# Patient Record
Sex: Male | Born: 1940 | Race: Black or African American | Hispanic: No | Smoking: Light tobacco smoker
Health system: Southern US, Community
[De-identification: ages and names within clinical notes are randomized; demographics above are authoritative.]

## PROBLEM LIST (undated history)

## (undated) DIAGNOSIS — F32A Depression, unspecified: Secondary | ICD-10-CM

## (undated) DIAGNOSIS — R1310 Dysphagia, unspecified: Secondary | ICD-10-CM

## (undated) DIAGNOSIS — K219 Gastro-esophageal reflux disease without esophagitis: Secondary | ICD-10-CM

## (undated) DIAGNOSIS — E669 Obesity, unspecified: Secondary | ICD-10-CM

## (undated) DIAGNOSIS — F329 Major depressive disorder, single episode, unspecified: Secondary | ICD-10-CM

## (undated) DIAGNOSIS — I679 Cerebrovascular disease, unspecified: Secondary | ICD-10-CM

## (undated) DIAGNOSIS — Z7901 Long term (current) use of anticoagulants: Secondary | ICD-10-CM

## (undated) DIAGNOSIS — I2699 Other pulmonary embolism without acute cor pulmonale: Secondary | ICD-10-CM

## (undated) DIAGNOSIS — R471 Dysarthria and anarthria: Secondary | ICD-10-CM

## (undated) DIAGNOSIS — J309 Allergic rhinitis, unspecified: Secondary | ICD-10-CM

## (undated) DIAGNOSIS — N451 Epididymitis: Secondary | ICD-10-CM

## (undated) DIAGNOSIS — I639 Cerebral infarction, unspecified: Secondary | ICD-10-CM

## (undated) DIAGNOSIS — T1491XA Suicide attempt, initial encounter: Secondary | ICD-10-CM

## (undated) DIAGNOSIS — I10 Essential (primary) hypertension: Secondary | ICD-10-CM

## (undated) DIAGNOSIS — F17201 Nicotine dependence, unspecified, in remission: Secondary | ICD-10-CM

## (undated) DIAGNOSIS — R7301 Impaired fasting glucose: Secondary | ICD-10-CM

## (undated) DIAGNOSIS — M1990 Unspecified osteoarthritis, unspecified site: Secondary | ICD-10-CM

## (undated) DIAGNOSIS — N400 Enlarged prostate without lower urinary tract symptoms: Secondary | ICD-10-CM

## (undated) DIAGNOSIS — Q6600 Congenital talipes equinovarus, unspecified foot: Secondary | ICD-10-CM

## (undated) DIAGNOSIS — E559 Vitamin D deficiency, unspecified: Secondary | ICD-10-CM

## (undated) DIAGNOSIS — E785 Hyperlipidemia, unspecified: Secondary | ICD-10-CM

## (undated) DIAGNOSIS — I2510 Atherosclerotic heart disease of native coronary artery without angina pectoris: Secondary | ICD-10-CM

## (undated) DIAGNOSIS — F1910 Other psychoactive substance abuse, uncomplicated: Secondary | ICD-10-CM

## (undated) DIAGNOSIS — N452 Orchitis: Secondary | ICD-10-CM

## (undated) DIAGNOSIS — I252 Old myocardial infarction: Secondary | ICD-10-CM

## (undated) DIAGNOSIS — M6281 Muscle weakness (generalized): Secondary | ICD-10-CM

## (undated) DIAGNOSIS — F419 Anxiety disorder, unspecified: Secondary | ICD-10-CM

## (undated) DIAGNOSIS — I671 Cerebral aneurysm, nonruptured: Secondary | ICD-10-CM

## (undated) DIAGNOSIS — N453 Epididymo-orchitis: Secondary | ICD-10-CM

## (undated) DIAGNOSIS — E119 Type 2 diabetes mellitus without complications: Secondary | ICD-10-CM

## (undated) HISTORY — DX: Cerebrovascular disease, unspecified: I67.9

## (undated) HISTORY — DX: Unspecified osteoarthritis, unspecified site: M19.90

## (undated) HISTORY — DX: Depression, unspecified: F32.A

## (undated) HISTORY — DX: Cerebral infarction, unspecified: I63.9

## (undated) HISTORY — DX: Major depressive disorder, single episode, unspecified: F32.9

## (undated) HISTORY — DX: Other psychoactive substance abuse, uncomplicated: F19.10

## (undated) HISTORY — DX: Obesity, unspecified: E66.9

## (undated) HISTORY — DX: Anxiety disorder, unspecified: F41.9

## (undated) HISTORY — DX: Gastro-esophageal reflux disease without esophagitis: K21.9

## (undated) HISTORY — DX: Epididymitis: N45.1

## (undated) HISTORY — DX: Nicotine dependence, unspecified, in remission: F17.201

## (undated) HISTORY — DX: Orchitis: N45.2

## (undated) HISTORY — DX: Hyperlipidemia, unspecified: E78.5

## (undated) HISTORY — DX: Impaired fasting glucose: R73.01

## (undated) HISTORY — DX: Essential (primary) hypertension: I10

## (undated) HISTORY — DX: Epididymo-orchitis: N45.3

## (undated) HISTORY — DX: Other pulmonary embolism without acute cor pulmonale: I26.99

## (undated) HISTORY — DX: Long term (current) use of anticoagulants: Z79.01

---

## 1964-08-06 HISTORY — PX: LAPAROTOMY: SHX154

## 1977-08-06 HISTORY — PX: ORIF FEMUR FRACTURE: SHX2119

## 1977-08-06 HISTORY — PX: TOTAL HIP ARTHROPLASTY: SHX124

## 1988-08-06 HISTORY — PX: CEREBRAL ANEURYSM REPAIR: SHX164

## 1998-08-06 HISTORY — PX: EYE SURGERY: SHX253

## 2001-02-16 ENCOUNTER — Encounter: Payer: Self-pay | Admitting: Emergency Medicine

## 2001-02-16 ENCOUNTER — Inpatient Hospital Stay (HOSPITAL_COMMUNITY): Admission: EM | Admit: 2001-02-16 | Discharge: 2001-02-20 | Payer: Self-pay | Admitting: Emergency Medicine

## 2001-02-17 ENCOUNTER — Encounter: Payer: Self-pay | Admitting: Internal Medicine

## 2001-02-18 ENCOUNTER — Encounter: Payer: Self-pay | Admitting: Internal Medicine

## 2001-05-27 ENCOUNTER — Encounter: Admission: RE | Admit: 2001-05-27 | Discharge: 2001-05-27 | Payer: Self-pay | Admitting: Ophthalmology

## 2001-05-27 ENCOUNTER — Encounter: Payer: Self-pay | Admitting: Ophthalmology

## 2001-05-29 ENCOUNTER — Ambulatory Visit (HOSPITAL_BASED_OUTPATIENT_CLINIC_OR_DEPARTMENT_OTHER): Admission: RE | Admit: 2001-05-29 | Discharge: 2001-05-29 | Payer: Self-pay | Admitting: *Deleted

## 2001-11-05 ENCOUNTER — Encounter: Payer: Self-pay | Admitting: Emergency Medicine

## 2001-11-05 ENCOUNTER — Encounter: Payer: Self-pay | Admitting: Family Medicine

## 2001-11-06 ENCOUNTER — Encounter: Payer: Self-pay | Admitting: Family Medicine

## 2001-11-06 ENCOUNTER — Inpatient Hospital Stay (HOSPITAL_COMMUNITY): Admission: EM | Admit: 2001-11-06 | Discharge: 2001-11-07 | Payer: Self-pay | Admitting: *Deleted

## 2001-11-10 ENCOUNTER — Ambulatory Visit (HOSPITAL_COMMUNITY): Admission: RE | Admit: 2001-11-10 | Discharge: 2001-11-10 | Payer: Self-pay | Admitting: Internal Medicine

## 2002-05-21 ENCOUNTER — Encounter: Payer: Self-pay | Admitting: Emergency Medicine

## 2002-05-21 ENCOUNTER — Observation Stay (HOSPITAL_COMMUNITY): Admission: EM | Admit: 2002-05-21 | Discharge: 2002-05-23 | Payer: Self-pay | Admitting: Emergency Medicine

## 2002-08-20 ENCOUNTER — Emergency Department (HOSPITAL_COMMUNITY): Admission: EM | Admit: 2002-08-20 | Discharge: 2002-08-21 | Payer: Self-pay | Admitting: Emergency Medicine

## 2003-06-29 ENCOUNTER — Emergency Department (HOSPITAL_COMMUNITY): Admission: EM | Admit: 2003-06-29 | Discharge: 2003-06-29 | Payer: Self-pay | Admitting: Emergency Medicine

## 2003-06-30 ENCOUNTER — Inpatient Hospital Stay (HOSPITAL_COMMUNITY): Admission: RE | Admit: 2003-06-30 | Discharge: 2003-07-01 | Payer: Self-pay | Admitting: Family Medicine

## 2003-08-07 DIAGNOSIS — N451 Epididymitis: Secondary | ICD-10-CM

## 2003-08-07 HISTORY — DX: Orchitis: N45.1

## 2003-09-18 ENCOUNTER — Emergency Department (HOSPITAL_COMMUNITY): Admission: EM | Admit: 2003-09-18 | Discharge: 2003-09-18 | Payer: Self-pay | Admitting: Emergency Medicine

## 2003-09-23 ENCOUNTER — Ambulatory Visit (HOSPITAL_COMMUNITY): Admission: RE | Admit: 2003-09-23 | Discharge: 2003-09-23 | Payer: Self-pay | Admitting: Family Medicine

## 2003-10-07 ENCOUNTER — Inpatient Hospital Stay (HOSPITAL_COMMUNITY): Admission: EM | Admit: 2003-10-07 | Discharge: 2003-10-12 | Payer: Self-pay | Admitting: Psychiatry

## 2003-10-28 ENCOUNTER — Emergency Department (HOSPITAL_COMMUNITY): Admission: EM | Admit: 2003-10-28 | Discharge: 2003-10-28 | Payer: Self-pay | Admitting: Emergency Medicine

## 2003-11-08 ENCOUNTER — Inpatient Hospital Stay (HOSPITAL_COMMUNITY): Admission: EM | Admit: 2003-11-08 | Discharge: 2003-11-10 | Payer: Self-pay | Admitting: Emergency Medicine

## 2004-05-05 ENCOUNTER — Ambulatory Visit: Payer: Self-pay | Admitting: *Deleted

## 2004-05-29 ENCOUNTER — Ambulatory Visit: Payer: Self-pay | Admitting: *Deleted

## 2004-06-07 ENCOUNTER — Ambulatory Visit: Payer: Self-pay | Admitting: Family Medicine

## 2004-06-13 ENCOUNTER — Ambulatory Visit: Payer: Self-pay | Admitting: Family Medicine

## 2004-06-23 ENCOUNTER — Ambulatory Visit: Payer: Self-pay | Admitting: Cardiology

## 2004-07-25 ENCOUNTER — Ambulatory Visit: Payer: Self-pay | Admitting: Cardiology

## 2004-08-09 ENCOUNTER — Ambulatory Visit: Payer: Self-pay | Admitting: *Deleted

## 2004-09-07 ENCOUNTER — Ambulatory Visit: Payer: Self-pay | Admitting: Internal Medicine

## 2004-10-05 ENCOUNTER — Ambulatory Visit: Payer: Self-pay | Admitting: Family Medicine

## 2004-10-09 ENCOUNTER — Ambulatory Visit: Payer: Self-pay | Admitting: *Deleted

## 2004-10-23 ENCOUNTER — Ambulatory Visit: Payer: Self-pay | Admitting: Cardiology

## 2004-11-09 ENCOUNTER — Ambulatory Visit: Payer: Self-pay | Admitting: Family Medicine

## 2004-11-21 ENCOUNTER — Ambulatory Visit: Payer: Self-pay | Admitting: *Deleted

## 2004-12-28 ENCOUNTER — Ambulatory Visit: Payer: Self-pay | Admitting: *Deleted

## 2005-01-29 ENCOUNTER — Ambulatory Visit: Payer: Self-pay | Admitting: *Deleted

## 2005-03-01 ENCOUNTER — Ambulatory Visit: Payer: Self-pay | Admitting: Cardiology

## 2005-03-07 ENCOUNTER — Ambulatory Visit: Payer: Self-pay | Admitting: Family Medicine

## 2005-03-21 ENCOUNTER — Ambulatory Visit: Payer: Self-pay | Admitting: *Deleted

## 2005-04-18 ENCOUNTER — Ambulatory Visit: Payer: Self-pay | Admitting: Family Medicine

## 2005-04-18 ENCOUNTER — Ambulatory Visit: Payer: Self-pay | Admitting: *Deleted

## 2005-05-08 ENCOUNTER — Ambulatory Visit: Payer: Self-pay | Admitting: Cardiology

## 2005-06-07 ENCOUNTER — Ambulatory Visit: Payer: Self-pay | Admitting: Family Medicine

## 2005-06-07 ENCOUNTER — Ambulatory Visit: Payer: Self-pay | Admitting: *Deleted

## 2005-07-06 ENCOUNTER — Ambulatory Visit: Payer: Self-pay | Admitting: Cardiology

## 2005-08-08 ENCOUNTER — Ambulatory Visit: Payer: Self-pay | Admitting: *Deleted

## 2005-09-04 ENCOUNTER — Ambulatory Visit: Payer: Self-pay | Admitting: Family Medicine

## 2005-09-12 ENCOUNTER — Ambulatory Visit: Payer: Self-pay | Admitting: *Deleted

## 2005-10-11 ENCOUNTER — Ambulatory Visit: Payer: Self-pay | Admitting: Family Medicine

## 2005-10-12 ENCOUNTER — Ambulatory Visit: Payer: Self-pay | Admitting: *Deleted

## 2005-11-15 ENCOUNTER — Ambulatory Visit: Payer: Self-pay | Admitting: Family Medicine

## 2005-11-15 ENCOUNTER — Ambulatory Visit: Payer: Self-pay | Admitting: *Deleted

## 2005-12-11 ENCOUNTER — Ambulatory Visit: Payer: Self-pay | Admitting: Cardiology

## 2005-12-20 ENCOUNTER — Ambulatory Visit: Payer: Self-pay | Admitting: Family Medicine

## 2006-01-15 ENCOUNTER — Ambulatory Visit: Payer: Self-pay | Admitting: *Deleted

## 2006-02-12 ENCOUNTER — Ambulatory Visit: Payer: Self-pay | Admitting: Cardiology

## 2006-02-20 ENCOUNTER — Ambulatory Visit: Payer: Self-pay | Admitting: *Deleted

## 2006-03-22 ENCOUNTER — Ambulatory Visit: Payer: Self-pay | Admitting: Family Medicine

## 2006-03-22 ENCOUNTER — Ambulatory Visit: Payer: Self-pay | Admitting: *Deleted

## 2006-03-28 ENCOUNTER — Ambulatory Visit (HOSPITAL_COMMUNITY): Admission: RE | Admit: 2006-03-28 | Discharge: 2006-03-28 | Payer: Self-pay | Admitting: Family Medicine

## 2006-05-21 ENCOUNTER — Ambulatory Visit: Payer: Self-pay | Admitting: Cardiology

## 2006-06-04 ENCOUNTER — Ambulatory Visit: Payer: Self-pay | Admitting: *Deleted

## 2006-06-24 ENCOUNTER — Ambulatory Visit: Payer: Self-pay | Admitting: Family Medicine

## 2006-07-08 ENCOUNTER — Ambulatory Visit: Payer: Self-pay | Admitting: Cardiology

## 2006-08-08 ENCOUNTER — Ambulatory Visit: Payer: Self-pay | Admitting: Cardiology

## 2006-08-29 ENCOUNTER — Ambulatory Visit: Payer: Self-pay | Admitting: Cardiology

## 2006-10-07 ENCOUNTER — Ambulatory Visit: Payer: Self-pay | Admitting: Family Medicine

## 2006-11-07 ENCOUNTER — Ambulatory Visit: Payer: Self-pay | Admitting: Orthopedic Surgery

## 2006-11-12 ENCOUNTER — Ambulatory Visit: Payer: Self-pay | Admitting: Cardiovascular Disease

## 2006-11-18 ENCOUNTER — Encounter (HOSPITAL_COMMUNITY): Admission: RE | Admit: 2006-11-18 | Discharge: 2006-12-18 | Payer: Self-pay | Admitting: Cardiovascular Disease

## 2006-11-18 ENCOUNTER — Ambulatory Visit: Payer: Self-pay | Admitting: Cardiovascular Disease

## 2007-01-20 ENCOUNTER — Ambulatory Visit: Payer: Self-pay | Admitting: Cardiovascular Disease

## 2007-01-20 ENCOUNTER — Ambulatory Visit: Payer: Self-pay | Admitting: Family Medicine

## 2007-01-29 ENCOUNTER — Ambulatory Visit: Payer: Self-pay | Admitting: Cardiovascular Disease

## 2007-02-26 ENCOUNTER — Ambulatory Visit: Payer: Self-pay | Admitting: Cardiology

## 2007-03-06 ENCOUNTER — Ambulatory Visit: Payer: Self-pay | Admitting: Cardiology

## 2007-04-04 ENCOUNTER — Ambulatory Visit: Payer: Self-pay | Admitting: Cardiology

## 2007-05-05 ENCOUNTER — Ambulatory Visit: Payer: Self-pay | Admitting: Internal Medicine

## 2007-05-09 ENCOUNTER — Encounter: Payer: Self-pay | Admitting: Family Medicine

## 2007-05-09 LAB — CONVERTED CEMR LAB
ALT: 13 units/L (ref 0–53)
AST: 15 units/L (ref 0–37)
Albumin: 4 g/dL (ref 3.5–5.2)
Alkaline Phosphatase: 69 units/L (ref 39–117)
BUN: 13 mg/dL (ref 6–23)
CO2: 22 meq/L (ref 19–32)
Chloride: 107 meq/L (ref 96–112)
Glucose, Bld: 91 mg/dL (ref 70–99)
PSA: 2.69 ng/mL (ref 0.10–4.00)
Potassium: 4.7 meq/L (ref 3.5–5.3)
Sodium: 139 meq/L (ref 135–145)
Total Protein: 7.6 g/dL (ref 6.0–8.3)

## 2007-05-14 ENCOUNTER — Ambulatory Visit: Payer: Self-pay | Admitting: Family Medicine

## 2007-06-02 ENCOUNTER — Ambulatory Visit: Payer: Self-pay | Admitting: Internal Medicine

## 2007-07-02 ENCOUNTER — Ambulatory Visit: Payer: Self-pay | Admitting: Cardiology

## 2007-07-18 ENCOUNTER — Ambulatory Visit: Payer: Self-pay | Admitting: Cardiology

## 2007-08-07 ENCOUNTER — Encounter: Payer: Self-pay | Admitting: Family Medicine

## 2007-08-14 ENCOUNTER — Ambulatory Visit: Payer: Self-pay | Admitting: Internal Medicine

## 2007-08-21 ENCOUNTER — Ambulatory Visit: Payer: Self-pay | Admitting: Cardiology

## 2007-09-05 ENCOUNTER — Ambulatory Visit: Payer: Self-pay | Admitting: Internal Medicine

## 2007-09-19 ENCOUNTER — Emergency Department (HOSPITAL_COMMUNITY): Admission: EM | Admit: 2007-09-19 | Discharge: 2007-09-19 | Payer: Self-pay | Admitting: Emergency Medicine

## 2007-09-19 ENCOUNTER — Ambulatory Visit: Payer: Self-pay | Admitting: Cardiology

## 2007-09-24 ENCOUNTER — Ambulatory Visit: Payer: Self-pay | Admitting: Family Medicine

## 2007-10-08 ENCOUNTER — Ambulatory Visit: Payer: Self-pay | Admitting: Family Medicine

## 2007-10-08 LAB — CONVERTED CEMR LAB
AST: 20 units/L (ref 0–37)
Albumin: 4.1 g/dL (ref 3.5–5.2)
Alkaline Phosphatase: 159 units/L — ABNORMAL HIGH (ref 39–117)
BUN: 22 mg/dL (ref 6–23)
Calcium: 9.4 mg/dL (ref 8.4–10.5)
Chloride: 107 meq/L (ref 96–112)
Creatinine, Ser: 1.2 mg/dL (ref 0.40–1.50)
HDL: 40 mg/dL (ref >39)
Indirect Bilirubin: 0.4 mg/dL (ref 0.0–0.9)
LDL Cholesterol: 145 mg/dL — ABNORMAL HIGH (ref 0–99)
Total Protein: 8.2 g/dL (ref 6.0–8.3)
Triglycerides: 154 mg/dL — ABNORMAL HIGH (ref <150)

## 2007-11-03 ENCOUNTER — Ambulatory Visit: Payer: Self-pay | Admitting: Cardiology

## 2007-12-01 ENCOUNTER — Ambulatory Visit: Payer: Self-pay | Admitting: Cardiology

## 2007-12-31 ENCOUNTER — Ambulatory Visit: Payer: Self-pay | Admitting: Cardiovascular Disease

## 2008-01-14 ENCOUNTER — Ambulatory Visit: Payer: Self-pay | Admitting: Cardiology

## 2008-02-10 ENCOUNTER — Ambulatory Visit: Payer: Self-pay | Admitting: Family Medicine

## 2008-02-11 ENCOUNTER — Ambulatory Visit: Payer: Self-pay | Admitting: Cardiology

## 2008-02-16 DIAGNOSIS — E669 Obesity, unspecified: Secondary | ICD-10-CM

## 2008-02-16 DIAGNOSIS — E785 Hyperlipidemia, unspecified: Secondary | ICD-10-CM

## 2008-02-16 DIAGNOSIS — I10 Essential (primary) hypertension: Secondary | ICD-10-CM | POA: Insufficient documentation

## 2008-02-21 ENCOUNTER — Encounter: Payer: Self-pay | Admitting: Family Medicine

## 2008-02-21 LAB — CONVERTED CEMR LAB
AST: 14 units/L (ref 0–37)
Alkaline Phosphatase: 81 units/L (ref 39–117)
BUN: 17 mg/dL (ref 6–23)
CO2: 20 meq/L (ref 19–32)
Calcium: 9.1 mg/dL (ref 8.4–10.5)
Chloride: 115 meq/L — ABNORMAL HIGH (ref 96–112)
Creatinine, Ser: 1.42 mg/dL (ref 0.40–1.50)
Indirect Bilirubin: 0.2 mg/dL (ref 0.0–0.9)
LDL Cholesterol: 111 mg/dL — ABNORMAL HIGH (ref 0–99)
Total Bilirubin: 0.3 mg/dL (ref 0.3–1.2)
Triglycerides: 212 mg/dL — ABNORMAL HIGH (ref <150)

## 2008-03-12 ENCOUNTER — Encounter: Payer: Self-pay | Admitting: Family Medicine

## 2008-03-17 ENCOUNTER — Ambulatory Visit: Payer: Self-pay | Admitting: Cardiology

## 2008-04-14 ENCOUNTER — Ambulatory Visit: Payer: Self-pay | Admitting: Cardiology

## 2008-04-28 ENCOUNTER — Ambulatory Visit: Payer: Self-pay | Admitting: Cardiology

## 2008-05-10 ENCOUNTER — Ambulatory Visit: Payer: Self-pay | Admitting: Cardiology

## 2008-05-12 ENCOUNTER — Telehealth (INDEPENDENT_AMBULATORY_CARE_PROVIDER_SITE_OTHER): Payer: Self-pay | Admitting: *Deleted

## 2008-05-13 ENCOUNTER — Ambulatory Visit: Payer: Self-pay | Admitting: Family Medicine

## 2008-08-10 ENCOUNTER — Encounter: Payer: Self-pay | Admitting: Family Medicine

## 2008-08-30 ENCOUNTER — Ambulatory Visit: Payer: Self-pay | Admitting: Family Medicine

## 2008-08-30 DIAGNOSIS — E739 Lactose intolerance, unspecified: Secondary | ICD-10-CM | POA: Insufficient documentation

## 2008-09-02 ENCOUNTER — Encounter: Payer: Self-pay | Admitting: Family Medicine

## 2008-10-06 ENCOUNTER — Encounter: Payer: Self-pay | Admitting: Emergency Medicine

## 2008-10-06 ENCOUNTER — Inpatient Hospital Stay (HOSPITAL_COMMUNITY): Admission: EM | Admit: 2008-10-06 | Discharge: 2008-10-14 | Payer: Self-pay | Admitting: Emergency Medicine

## 2008-11-23 ENCOUNTER — Telehealth: Payer: Self-pay | Admitting: Family Medicine

## 2008-11-24 ENCOUNTER — Ambulatory Visit: Payer: Self-pay | Admitting: Cardiology

## 2008-11-24 ENCOUNTER — Telehealth: Payer: Self-pay | Admitting: Family Medicine

## 2008-11-24 ENCOUNTER — Ambulatory Visit: Payer: Self-pay | Admitting: Family Medicine

## 2008-11-25 ENCOUNTER — Telehealth: Payer: Self-pay | Admitting: Family Medicine

## 2008-11-25 ENCOUNTER — Encounter: Payer: Self-pay | Admitting: Family Medicine

## 2008-11-26 ENCOUNTER — Telehealth: Payer: Self-pay | Admitting: Family Medicine

## 2008-11-26 LAB — CONVERTED CEMR LAB
ALT: <8 units/L (ref 0–53)
Alkaline Phosphatase: 89 units/L (ref 39–117)
BUN: 17 mg/dL (ref 6–23)
Basophils Absolute: 0 10*3/uL (ref 0.0–0.1)
Basophils Relative: 0 % (ref 0–1)
Bilirubin, Direct: 0.1 mg/dL (ref 0.0–0.3)
Calcium: 9.4 mg/dL (ref 8.4–10.5)
Cholesterol: 216 mg/dL — ABNORMAL HIGH (ref 0–200)
Creatinine, Ser: 1.21 mg/dL (ref 0.40–1.50)
Eosinophils Absolute: 0.2 10*3/uL (ref 0.0–0.7)
Eosinophils Relative: 3 % (ref 0–5)
Glucose, Bld: 90 mg/dL (ref 70–99)
HCT: 44.1 % (ref 39.0–52.0)
Hemoglobin: 15.4 g/dL (ref 13.0–17.0)
Indirect Bilirubin: 0.3 mg/dL (ref 0.0–0.9)
MCHC: 34.9 g/dL (ref 30.0–36.0)
Monocytes Absolute: 0.7 10*3/uL (ref 0.1–1.0)
Platelets: 166 10*3/uL (ref 150–400)
Potassium: 4.2 meq/L (ref 3.5–5.3)
RDW: 13.5 % (ref 11.5–15.5)
VLDL: 25 mg/dL (ref 0–40)

## 2008-11-30 ENCOUNTER — Telehealth: Payer: Self-pay | Admitting: Family Medicine

## 2008-12-01 ENCOUNTER — Telehealth: Payer: Self-pay | Admitting: Family Medicine

## 2008-12-02 ENCOUNTER — Encounter: Payer: Self-pay | Admitting: Family Medicine

## 2008-12-07 ENCOUNTER — Encounter: Payer: Self-pay | Admitting: Family Medicine

## 2008-12-10 ENCOUNTER — Encounter: Admission: RE | Admit: 2008-12-10 | Discharge: 2008-12-10 | Payer: Self-pay | Admitting: Neurosurgery

## 2008-12-13 ENCOUNTER — Encounter: Payer: Self-pay | Admitting: Family Medicine

## 2008-12-16 ENCOUNTER — Ambulatory Visit: Payer: Self-pay | Admitting: Cardiology

## 2008-12-28 ENCOUNTER — Encounter: Payer: Self-pay | Admitting: Family Medicine

## 2008-12-30 ENCOUNTER — Encounter: Payer: Self-pay | Admitting: Family Medicine

## 2009-01-06 ENCOUNTER — Ambulatory Visit: Payer: Self-pay | Admitting: Family Medicine

## 2009-01-13 ENCOUNTER — Encounter: Payer: Self-pay | Admitting: Family Medicine

## 2009-01-14 ENCOUNTER — Encounter: Payer: Self-pay | Admitting: Family Medicine

## 2009-01-14 ENCOUNTER — Encounter: Payer: Self-pay | Admitting: Orthopedic Surgery

## 2009-01-14 ENCOUNTER — Encounter: Admission: RE | Admit: 2009-01-14 | Discharge: 2009-01-14 | Payer: Self-pay | Admitting: Neurosurgery

## 2009-01-18 ENCOUNTER — Encounter: Payer: Self-pay | Admitting: Family Medicine

## 2009-01-19 ENCOUNTER — Encounter: Payer: Self-pay | Admitting: Family Medicine

## 2009-01-20 ENCOUNTER — Telehealth: Payer: Self-pay | Admitting: Family Medicine

## 2009-01-24 ENCOUNTER — Ambulatory Visit: Payer: Self-pay | Admitting: Family Medicine

## 2009-02-02 ENCOUNTER — Encounter: Payer: Self-pay | Admitting: Family Medicine

## 2009-02-02 LAB — CONVERTED CEMR LAB: TSH: 0.921 microintl units/mL (ref 0.350–4.500)

## 2009-02-09 ENCOUNTER — Encounter: Payer: Self-pay | Admitting: Family Medicine

## 2009-03-01 ENCOUNTER — Encounter: Payer: Self-pay | Admitting: Family Medicine

## 2009-03-07 ENCOUNTER — Encounter: Payer: Self-pay | Admitting: Cardiology

## 2009-03-16 ENCOUNTER — Encounter: Admission: RE | Admit: 2009-03-16 | Discharge: 2009-03-16 | Payer: Self-pay | Admitting: Neurosurgery

## 2009-03-17 ENCOUNTER — Ambulatory Visit: Payer: Self-pay

## 2009-03-17 ENCOUNTER — Encounter: Payer: Self-pay | Admitting: Family Medicine

## 2009-03-17 ENCOUNTER — Encounter: Payer: Self-pay | Admitting: Cardiology

## 2009-03-21 ENCOUNTER — Encounter: Payer: Self-pay | Admitting: *Deleted

## 2009-04-04 ENCOUNTER — Ambulatory Visit: Payer: Self-pay

## 2009-04-06 ENCOUNTER — Encounter: Payer: Self-pay | Admitting: Family Medicine

## 2009-04-07 ENCOUNTER — Ambulatory Visit: Payer: Self-pay | Admitting: Family Medicine

## 2009-04-07 LAB — CONVERTED CEMR LAB
Bilirubin Urine: NEGATIVE
Ketones, urine, test strip: NEGATIVE
Specific Gravity, Urine: 1.02
Urobilinogen, UA: 1

## 2009-04-08 ENCOUNTER — Encounter: Payer: Self-pay | Admitting: Family Medicine

## 2009-04-13 LAB — CONVERTED CEMR LAB
ALT: 11 units/L (ref 0–53)
Albumin: 3.7 g/dL (ref 3.5–5.2)
Cholesterol: 193 mg/dL (ref 0–200)
Glucose, Bld: 83 mg/dL (ref 70–99)
Potassium: 4.5 meq/L (ref 3.5–5.3)
Sodium: 142 meq/L (ref 135–145)
Total CHOL/HDL Ratio: 4
Total Protein: 7.8 g/dL (ref 6.0–8.3)
Triglycerides: 132 mg/dL (ref <150)
VLDL: 26 mg/dL (ref 0–40)

## 2009-04-27 ENCOUNTER — Encounter: Payer: Self-pay | Admitting: Orthopedic Surgery

## 2009-05-02 ENCOUNTER — Ambulatory Visit: Payer: Self-pay | Admitting: Cardiology

## 2009-05-02 LAB — CONVERTED CEMR LAB: POC INR: 2.4

## 2009-05-04 ENCOUNTER — Ambulatory Visit: Payer: Self-pay | Admitting: Orthopedic Surgery

## 2009-05-06 ENCOUNTER — Telehealth: Payer: Self-pay | Admitting: Orthopedic Surgery

## 2009-05-09 ENCOUNTER — Encounter: Payer: Self-pay | Admitting: Family Medicine

## 2009-05-10 ENCOUNTER — Ambulatory Visit (HOSPITAL_COMMUNITY): Admission: RE | Admit: 2009-05-10 | Discharge: 2009-05-10 | Payer: Self-pay | Admitting: Family Medicine

## 2009-05-12 ENCOUNTER — Telehealth: Payer: Self-pay | Admitting: Orthopedic Surgery

## 2009-05-30 ENCOUNTER — Ambulatory Visit: Payer: Self-pay | Admitting: Cardiology

## 2009-05-30 LAB — CONVERTED CEMR LAB: POC INR: 2.6

## 2009-06-07 ENCOUNTER — Encounter: Payer: Self-pay | Admitting: Family Medicine

## 2009-06-08 ENCOUNTER — Encounter: Payer: Self-pay | Admitting: Family Medicine

## 2009-06-08 ENCOUNTER — Telehealth: Payer: Self-pay | Admitting: Family Medicine

## 2009-06-13 ENCOUNTER — Encounter: Payer: Self-pay | Admitting: Family Medicine

## 2009-06-27 ENCOUNTER — Ambulatory Visit: Payer: Self-pay | Admitting: Cardiology

## 2009-07-07 ENCOUNTER — Encounter: Payer: Self-pay | Admitting: Family Medicine

## 2009-07-08 ENCOUNTER — Encounter: Payer: Self-pay | Admitting: Family Medicine

## 2009-07-27 ENCOUNTER — Ambulatory Visit: Payer: Self-pay | Admitting: Cardiology

## 2009-08-03 ENCOUNTER — Ambulatory Visit: Payer: Self-pay | Admitting: Family Medicine

## 2009-08-03 DIAGNOSIS — N453 Epididymo-orchitis: Secondary | ICD-10-CM | POA: Insufficient documentation

## 2009-08-06 DIAGNOSIS — R7301 Impaired fasting glucose: Secondary | ICD-10-CM

## 2009-08-06 HISTORY — DX: Impaired fasting glucose: R73.01

## 2009-08-08 ENCOUNTER — Encounter: Payer: Self-pay | Admitting: Family Medicine

## 2009-08-10 ENCOUNTER — Encounter: Payer: Self-pay | Admitting: Family Medicine

## 2009-08-25 ENCOUNTER — Ambulatory Visit: Payer: Self-pay | Admitting: Cardiology

## 2009-09-08 ENCOUNTER — Telehealth: Payer: Self-pay | Admitting: Family Medicine

## 2009-09-21 ENCOUNTER — Encounter: Payer: Self-pay | Admitting: Family Medicine

## 2009-09-22 ENCOUNTER — Ambulatory Visit: Payer: Self-pay | Admitting: Cardiovascular Disease

## 2009-10-04 ENCOUNTER — Ambulatory Visit: Payer: Self-pay | Admitting: Family Medicine

## 2009-10-19 ENCOUNTER — Ambulatory Visit: Payer: Self-pay | Admitting: Family Medicine

## 2009-10-20 ENCOUNTER — Ambulatory Visit: Payer: Self-pay | Admitting: Cardiology

## 2009-11-01 LAB — CONVERTED CEMR LAB
BUN: 26 mg/dL — ABNORMAL HIGH (ref 6–23)
Bilirubin, Direct: 0.1 mg/dL (ref 0.0–0.3)
Chloride: 104 meq/L (ref 96–112)
Glucose, Bld: 84 mg/dL (ref 70–99)
Hgb A1c MFr Bld: 6.4 % — ABNORMAL HIGH (ref 4.6–6.1)
Indirect Bilirubin: 0.4 mg/dL (ref 0.0–0.9)
LDL Cholesterol: 108 mg/dL — ABNORMAL HIGH (ref 0–99)
Potassium: 4.7 meq/L (ref 3.5–5.3)
Total Protein: 8 g/dL (ref 6.0–8.3)
VLDL: 32 mg/dL (ref 0–40)

## 2009-11-17 ENCOUNTER — Telehealth: Payer: Self-pay | Admitting: Family Medicine

## 2009-11-17 ENCOUNTER — Ambulatory Visit: Payer: Self-pay | Admitting: Cardiology

## 2009-11-17 LAB — CONVERTED CEMR LAB: POC INR: 2.1

## 2009-12-15 ENCOUNTER — Ambulatory Visit: Payer: Self-pay | Admitting: Cardiology

## 2009-12-15 LAB — CONVERTED CEMR LAB: POC INR: 2

## 2009-12-16 ENCOUNTER — Ambulatory Visit: Payer: Self-pay | Admitting: Cardiology

## 2009-12-16 ENCOUNTER — Inpatient Hospital Stay (HOSPITAL_COMMUNITY): Admission: EM | Admit: 2009-12-16 | Discharge: 2009-12-21 | Payer: Self-pay | Admitting: Emergency Medicine

## 2009-12-16 ENCOUNTER — Ambulatory Visit: Payer: Self-pay | Admitting: Surgery

## 2009-12-16 ENCOUNTER — Encounter (INDEPENDENT_AMBULATORY_CARE_PROVIDER_SITE_OTHER): Payer: Self-pay | Admitting: Internal Medicine

## 2009-12-17 ENCOUNTER — Encounter (INDEPENDENT_AMBULATORY_CARE_PROVIDER_SITE_OTHER): Payer: Self-pay | Admitting: Internal Medicine

## 2009-12-19 ENCOUNTER — Ambulatory Visit: Payer: Self-pay | Admitting: Physical Medicine & Rehabilitation

## 2009-12-29 ENCOUNTER — Ambulatory Visit: Payer: Self-pay | Admitting: Cardiology

## 2009-12-29 ENCOUNTER — Encounter (HOSPITAL_COMMUNITY): Admission: RE | Admit: 2009-12-29 | Discharge: 2010-01-28 | Payer: Self-pay | Admitting: Cardiology

## 2010-01-10 ENCOUNTER — Ambulatory Visit: Payer: Self-pay | Admitting: Cardiology

## 2010-02-02 ENCOUNTER — Encounter (INDEPENDENT_AMBULATORY_CARE_PROVIDER_SITE_OTHER): Payer: Self-pay | Admitting: Pharmacist

## 2010-02-23 ENCOUNTER — Ambulatory Visit (HOSPITAL_COMMUNITY): Admission: RE | Admit: 2010-02-23 | Discharge: 2010-02-23 | Payer: Self-pay | Admitting: Internal Medicine

## 2010-03-08 ENCOUNTER — Encounter (INDEPENDENT_AMBULATORY_CARE_PROVIDER_SITE_OTHER): Payer: Self-pay | Admitting: *Deleted

## 2010-03-08 ENCOUNTER — Encounter: Payer: Self-pay | Admitting: Cardiology

## 2010-03-08 LAB — CONVERTED CEMR LAB
ALT: 15 units/L
AST: 15 units/L
Alkaline Phosphatase: 75 units/L
Bilirubin, Direct: 0.1 mg/dL
CO2: 26 meq/L
Hemoglobin: 13.8 g/dL
Hgb A1c MFr Bld: 5.7 %
Prothrombin Time: 25.9 s
RDW: 13.6 %
Sodium: 142 meq/L
Total Protein: 7.3 g/dL

## 2010-03-10 ENCOUNTER — Encounter (INDEPENDENT_AMBULATORY_CARE_PROVIDER_SITE_OTHER): Payer: Self-pay | Admitting: *Deleted

## 2010-03-17 ENCOUNTER — Ambulatory Visit: Payer: Self-pay | Admitting: Cardiology

## 2010-03-17 DIAGNOSIS — F17219 Nicotine dependence, cigarettes, with unspecified nicotine-induced disorders: Secondary | ICD-10-CM | POA: Insufficient documentation

## 2010-04-11 ENCOUNTER — Ambulatory Visit (HOSPITAL_COMMUNITY): Admission: RE | Admit: 2010-04-11 | Discharge: 2010-04-11 | Payer: Self-pay | Admitting: Internal Medicine

## 2010-04-12 ENCOUNTER — Telehealth: Payer: Self-pay | Admitting: Family Medicine

## 2010-04-13 ENCOUNTER — Telehealth: Payer: Self-pay | Admitting: Family Medicine

## 2010-04-18 ENCOUNTER — Ambulatory Visit: Payer: Self-pay | Admitting: Family Medicine

## 2010-04-20 ENCOUNTER — Encounter: Payer: Self-pay | Admitting: Family Medicine

## 2010-04-21 ENCOUNTER — Encounter: Payer: Self-pay | Admitting: Family Medicine

## 2010-04-24 ENCOUNTER — Encounter: Payer: Self-pay | Admitting: Family Medicine

## 2010-04-25 ENCOUNTER — Encounter: Payer: Self-pay | Admitting: Family Medicine

## 2010-05-11 ENCOUNTER — Telehealth: Payer: Self-pay | Admitting: Family Medicine

## 2010-05-12 ENCOUNTER — Telehealth: Payer: Self-pay | Admitting: Family Medicine

## 2010-05-17 ENCOUNTER — Telehealth: Payer: Self-pay | Admitting: Family Medicine

## 2010-05-31 ENCOUNTER — Telehealth: Payer: Self-pay | Admitting: Family Medicine

## 2010-06-12 LAB — CONVERTED CEMR LAB: Hgb A1c MFr Bld: 5.8 % — ABNORMAL HIGH (ref <5.7)

## 2010-06-20 ENCOUNTER — Telehealth: Payer: Self-pay | Admitting: Family Medicine

## 2010-06-20 ENCOUNTER — Ambulatory Visit: Payer: Self-pay | Admitting: Family Medicine

## 2010-06-26 ENCOUNTER — Encounter: Payer: Self-pay | Admitting: Family Medicine

## 2010-06-26 ENCOUNTER — Telehealth: Payer: Self-pay | Admitting: Family Medicine

## 2010-06-26 ENCOUNTER — Ambulatory Visit
Admission: RE | Admit: 2010-06-26 | Discharge: 2010-06-26 | Payer: Self-pay | Source: Home / Self Care | Admitting: Family Medicine

## 2010-07-01 ENCOUNTER — Encounter: Payer: Self-pay | Admitting: Family Medicine

## 2010-07-01 LAB — CONVERTED CEMR LAB
Bilirubin Urine: NEGATIVE
Casts: NONE SEEN /lpf
Crystals: NONE SEEN
Hemoglobin, Urine: NEGATIVE
Ketones, ur: NEGATIVE mg/dL
Urine Glucose: NEGATIVE mg/dL
pH: 6.5 (ref 5.0–8.0)

## 2010-07-14 ENCOUNTER — Encounter: Payer: Self-pay | Admitting: Family Medicine

## 2010-07-28 ENCOUNTER — Encounter: Payer: Self-pay | Admitting: Family Medicine

## 2010-08-06 HISTORY — PX: COLONOSCOPY: SHX174

## 2010-08-11 ENCOUNTER — Telehealth: Payer: Self-pay | Admitting: Family Medicine

## 2010-08-14 ENCOUNTER — Ambulatory Visit
Admission: RE | Admit: 2010-08-14 | Discharge: 2010-08-14 | Payer: Self-pay | Source: Home / Self Care | Attending: Family Medicine | Admitting: Family Medicine

## 2010-08-16 ENCOUNTER — Telehealth: Payer: Self-pay | Admitting: Family Medicine

## 2010-08-16 DIAGNOSIS — N390 Urinary tract infection, site not specified: Secondary | ICD-10-CM | POA: Insufficient documentation

## 2010-08-16 LAB — CONVERTED CEMR LAB
Basophils Absolute: 0 10*3/uL (ref 0.0–0.1)
Lymphocytes Relative: 45 % (ref 12–46)
Lymphs Abs: 2.7 10*3/uL (ref 0.7–4.0)
Neutrophils Relative %: 44 % (ref 43–77)
Platelets: 110 10*3/uL — ABNORMAL LOW (ref 150–400)
RDW: 12.8 % (ref 11.5–15.5)
WBC: 6.1 10*3/uL (ref 4.0–10.5)

## 2010-08-17 ENCOUNTER — Encounter: Payer: Self-pay | Admitting: Family Medicine

## 2010-08-17 ENCOUNTER — Telehealth: Payer: Self-pay | Admitting: Family Medicine

## 2010-08-18 ENCOUNTER — Ambulatory Visit
Admission: RE | Admit: 2010-08-18 | Discharge: 2010-08-18 | Payer: Self-pay | Source: Home / Self Care | Attending: Cardiology | Admitting: Cardiology

## 2010-08-18 LAB — CONVERTED CEMR LAB
Hgb A1c MFr Bld: 6 % — ABNORMAL HIGH (ref <5.7)
POC INR: 3.5

## 2010-08-20 ENCOUNTER — Encounter: Payer: Self-pay | Admitting: Family Medicine

## 2010-08-21 ENCOUNTER — Encounter: Payer: Self-pay | Admitting: Family Medicine

## 2010-08-21 LAB — CONVERTED CEMR LAB
Blood, UA: NEGATIVE
Casts: NONE SEEN /lpf
Protein, ur: 30 mg/dL — AB
Urine Glucose: NEGATIVE mg/dL
Urobilinogen, UA: 1 (ref 0.0–1.0)

## 2010-08-22 ENCOUNTER — Telehealth: Payer: Self-pay | Admitting: Family Medicine

## 2010-08-22 ENCOUNTER — Encounter: Payer: Self-pay | Admitting: Family Medicine

## 2010-08-24 ENCOUNTER — Ambulatory Visit: Admission: RE | Admit: 2010-08-24 | Discharge: 2010-08-24 | Payer: Self-pay | Source: Home / Self Care

## 2010-08-28 ENCOUNTER — Encounter: Payer: Self-pay | Admitting: Family Medicine

## 2010-08-28 ENCOUNTER — Telehealth (INDEPENDENT_AMBULATORY_CARE_PROVIDER_SITE_OTHER): Payer: Self-pay | Admitting: *Deleted

## 2010-08-28 ENCOUNTER — Telehealth: Payer: Self-pay | Admitting: Family Medicine

## 2010-08-28 ENCOUNTER — Encounter: Payer: Self-pay | Admitting: Neurosurgery

## 2010-09-05 ENCOUNTER — Encounter: Payer: Self-pay | Admitting: Family Medicine

## 2010-09-05 NOTE — Letter (Signed)
Summary: phone notes  phone notes   Imported By: Curtis Sites 01/04/2010 12:05:00  _____________________________________________________________________  External Attachment:    Type:   Image     Comment:   External Document

## 2010-09-05 NOTE — Miscellaneous (Signed)
Summary: Home Care Report  Home Care Report   Imported By: Lind Guest 10/20/2009 08:28:20  _____________________________________________________________________  External Attachment:    Type:   Image     Comment:   External Document

## 2010-09-05 NOTE — Progress Notes (Signed)
  Phone Note From Pharmacy   Caller: [facility] Summary of Call: patient no longer wants to drink the nutritional suppliment can we d/c Initial call taken by: Adella Hare LPN,  April 13, 2010 5:02 PM  Follow-up for Phone Call        yes per dr simspon pharmacy notified Follow-up by: Adella Hare LPN,  April 13, 2010 5:03 PM

## 2010-09-05 NOTE — Miscellaneous (Signed)
Summary: LABS CBCD,PT,BMP,LIVER,A1C,03/08/2010  Clinical Lists Changes  Observations: Added new observation of CALCIUM: 9.4 mg/dL (16/05/9603 54:09) Added new observation of ALBUMIN: 3.5 g/dL (81/19/1478 29:56) Added new observation of PROTEIN, TOT: 7.3 g/dL (21/30/8657 84:69) Added new observation of SGPT (ALT): 15 units/L (03/08/2010 15:17) Added new observation of SGOT (AST): 15 units/L (03/08/2010 15:17) Added new observation of ALK PHOS: 75 units/L (03/08/2010 15:17) Added new observation of BILI DIRECT: 0.1 mg/dL (62/95/2841 32:44) Added new observation of CREATININE: 1.20 mg/dL (08/08/7251 66:44) Added new observation of BUN: 24 mg/dL (03/47/4259 56:38) Added new observation of BG RANDOM: 86 mg/dL (75/64/3329 51:88) Added new observation of CO2 PLSM/SER: 26 meq/L (03/08/2010 15:17) Added new observation of CL SERUM: 113 meq/L (03/08/2010 15:17) Added new observation of K SERUM: 3.9 meq/L (03/08/2010 15:17) Added new observation of NA: 142 meq/L (03/08/2010 15:17) Added new observation of HGBA1C: 5.7 % (03/08/2010 15:17) Added new observation of PT PATIENT: 25.9 s (03/08/2010 15:17) Added new observation of PLATELETK/UL: 100 K/uL (03/08/2010 15:17) Added new observation of RDW: 13.6 % (03/08/2010 15:17) Added new observation of MCHC RBC: 34.8 g/dL (41/66/0630 16:01) Added new observation of MCV: 98.3 fL (03/08/2010 15:17) Added new observation of HCT: 39.7 % (03/08/2010 15:17) Added new observation of HGB: 13.8 g/dL (09/32/3557 32:20) Added new observation of RBC M/UL: 4.04 M/uL (03/08/2010 15:17) Added new observation of WBC COUNT: 6.1 10*3/microliter (03/08/2010 15:17)

## 2010-09-05 NOTE — Letter (Signed)
Summary: history and physical  history and physical   Imported By: Curtis Sites 01/04/2010 12:00:17  _____________________________________________________________________  External Attachment:    Type:   Image     Comment:   External Document

## 2010-09-05 NOTE — Medication Information (Signed)
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 no    Recent/future dental: no  Any missed doses?: no       Is patient compliant with meds? yes       Allergies: No Known Drug Allergies  Anticoagulation Management History:      The patient is taking warfarin and comes in today for a routine follow up visit.  Positive risk factors for bleeding include an age of 84 years or older.  The bleeding index is 'intermediate risk'.  Positive CHADS2 values include History of HTN.  Negative CHADS2 values include Age > 4 years old.  The start date was 06/30/2003.  Anticoagulation responsible provider: Dietrich Pates MD, Molly Maduro.  INR POC: 2.1.  Cuvette Lot#: [id number].  Exp: 10/11.    Anticoagulation Management Assessment/Plan:      The patient's current anticoagulation dose is Coumadin 5 mg tabs: Use as directed.  The target INR is 2 - 3.  The next INR is due 11/17/2009.  Anticoagulation instructions were given to patient.  Results were reviewed/authorized by Vashti Hey, RN.  He was notified by Vashti Hey RN.         Prior Anticoagulation Instructions: INR 2.4 Continue coumadin 5mg  once daily except 2.5mg  on Mondays, Wednesdays and Fridays  Current Anticoagulation Instructions: INR 2.1 Continue coumadin 5mg  once daily except 2.5mg  on Mondays, Wednesdays and Fridays

## 2010-09-05 NOTE — Medication Information (Signed)
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  They are managing his coumadin.  Allergies: No Known Drug Allergies  Anticoagulation Management History:      Positive risk factors for bleeding include an age of 69 years or older.  The bleeding index is 'intermediate risk'.  Positive CHADS2 values include History of HTN.  Negative CHADS2 values include Age > 58 years old.  The start date was 06/30/2003.  Anticoagulation responsible provider: Dietrich Pates MD, Molly Maduro.  Exp: 10/11.    Anticoagulation Management Assessment/Plan:      The patient's current anticoagulation dose is Coumadin 5 mg tabs: Use as directed.  The target INR is 2 - 3.  The next INR is due 01/12/2010.  Anticoagulation instructions were given to patient.  Results were reviewed/authorized by Inactive.         Prior Anticoagulation Instructions: INR 2.0 Continue coumadin 5mg  once daily except 2.5mg  on Mondays, Wednesdays and Fridays

## 2010-09-05 NOTE — Progress Notes (Signed)
Summary: cold (2 calls)  Phone Note Call from Patient   Summary of Call: home away rom left message he has a cold feverish  cong could you call something or fit him in today call back at [phone] Initial call taken by: Lind Guest,  May 31, 2010 12:44 PM  Follow-up for Phone Call        Joann from home away from home, wanted to know if we could call in [name] something for his congestion.  She states he has not had a BM in 4 days.  Please advise.  Additional Follow-up for Phone Call Additional follow up Details #1::        dvise tessalon perles called in make appt for early next week if no better, or fever , chills, green spurtum. there are standing order I am sure for constipation they need to follow those, andgive him prune juice , warm and prunes Additional Follow-up by: Syliva Overman MD,  June 01, 2010 1:05 PM    Additional Follow-up for Phone Call Additional follow up Details #2::    RETURNED CALL, BUSY SIGNAL X 3 Follow-up by: Adella Hare LPN,  June 02, 2010 3:13 PM  Additional Follow-up for Phone Call Additional follow up Details #3:: Details for Additional Follow-up Action Taken: patient doing much better per facility Additional Follow-up by: Adella Hare LPN,  June 06, 2010 9:21 AM  New/Updated Medications: TESSALON PERLES 100 MG CAPS (BENZONATATE) Take 1 capsule by mouth three times a day Prescriptions: TESSALON PERLES 100 MG CAPS (BENZONATATE) Take 1 capsule by mouth three times a day  #30 x 0   Entered and Authorized by:   Syliva Overman MD   Signed by:   Syliva Overman MD on 06/01/2010   Method used:   Electronically to        [facility] (retail)       [address]       [place], [place]  [address]       Ph: [phone]       Fax: [phone]   RxID:   [id number]

## 2010-09-05 NOTE — Letter (Signed)
Summary: labs  labs   Imported By: Curtis Sites 01/04/2010 12:15:47  _____________________________________________________________________  External Attachment:    Type:   Image     Comment:   External Document

## 2010-09-05 NOTE — Miscellaneous (Signed)
Summary: Orders Update  Clinical Lists Changes  Orders: Added new Test order of T-Urinalysis with Culture Reflex (65001) - Signed 

## 2010-09-05 NOTE — Miscellaneous (Signed)
Summary: refill  Clinical Lists Changes  Medications: Rx of OXYBUTYNIN CHLORIDE 10 MG XR24H-TAB (OXYBUTYNIN CHLORIDE) Take 1 tablet by mouth once a day;  #30 x 3;  Signed;  Entered by: Everitt Amber;  Authorized by: Syliva Overman MD;  Method used: Printed then faxed to [facility] [address], [place], [place], [place]  [address], Ph: [phone], Fax: [phone] Rx of ALPRAZOLAM 0.25 MG TABS (ALPRAZOLAM) Take 1 tablet by mouth three times a day;  #90 x 3;  Signed;  Entered by: Everitt Amber;  Authorized by: Syliva Overman MD;  Method used: Printed then faxed to [facility] [address], [place], [place], [place]  [address], Ph: [phone], Fax: [phone]    Prescriptions: ALPRAZOLAM 0.25 MG TABS (ALPRAZOLAM) Take 1 tablet by mouth three times a day  #90 x 3   Entered by:   Everitt Amber   Authorized by:   Syliva Overman MD   Signed by:   Everitt Amber on 08/10/2009   Method used:   Printed then faxed to ...       [facility] (retail)       [address]       [place], [place]  [address]       Ph: [phone]       Fax: [phone]   RxID:   [id number] OXYBUTYNIN CHLORIDE 10 MG XR24H-TAB (OXYBUTYNIN CHLORIDE) Take 1 tablet by mouth once a day  #30 x 3   Entered by:   Everitt Amber   Authorized by:   Syliva Overman MD   Signed by:   Everitt Amber on 08/10/2009   Method used:   Printed then faxed to ...       [facility] (retail)       [address]       [place], [place]  [address]       Ph: [phone]       Fax: [phone]   RxID:   [id number]

## 2010-09-05 NOTE — Letter (Signed)
Summary: med review  med review   Imported By: Lind Guest 06/20/2010 08:14:52  _____________________________________________________________________  External Attachment:    Type:   Image     Comment:   External Document

## 2010-09-05 NOTE — Letter (Signed)
Summary: wheelchair  wheelchair   Imported By: Lind Guest 07/14/2010 16:40:59  _____________________________________________________________________  External Attachment:    Type:   Image     Comment:   External Document

## 2010-09-05 NOTE — Assessment & Plan Note (Signed)
Summary: F UP   Vital Signs:  Patient profile:   70 year old male Height:      67 inches O2 Sat:      98 % on Room air Pulse rate:   59 / minute Pulse rhythm:   regular Resp:     16 per minute BP sitting:   98 / 62  (right arm)  Vitals Entered By: Mauricia Area CMA (June 20, 2010 11:55 AM)  O2 Flow:  Room air CC: Leg pain   Primary Care Provider:  Dr. Syliva Overman  CC:  Leg pain.  History of Present Illness: Pt in for routine f/u as well as to get a pWC. He has had 2 strokes and has upper and lower ext weakness, currently in a manual w/chair his arms are weak, needs a walker to ambulate has fallen as recently as 4 days ago C/O leg pain, bilateral , also weakness in the right leg espescially, he is having increased difficulty ambulating safely, even in the home, and is requesting a power wheelchair. he suffers bilateral upper extremity weakness,he is a stroke victim, and is therfore unable to propel himself in a manual wheelchairfor any appreciable time. Reports  thathe is otherwise doing fairly well. Denies recent fever or chills. Denies sinus pressure, nasal congestion , ear pain or sore throat. Denies chest congestion, or cough productive of sputum. Denies chest pain, palpitations, PND, orthopnea or leg swelling. Denies abdominal pain, nausea, vomitting, diarrhea or constipation. Denies change in bowel movements or bloody stool. Denies dysuria , frequency, incontinence or hesitancy.  Denies headaches, vertigo, seizures. Denies depression, anxiety or insomnia. Denies  rash, lesions, or itch.     Current Medications (verified): 1)  Carvedilol 25 Mg Tabs (Carvedilol) .... Take 1 Tab Two Times A Day 2)  Coumadin 5 Mg Tabs (Warfarin Sodium) .... Use As Directed 3)  Colace 100 Mg Caps (Docusate Sodium) .... Take 1 Capsule By Mouth Two Times A Day 4)  Lisinopril 40 Mg Tabs (Lisinopril) .... One Tab By Mouth Qd 5)  Citalopram Hydrobromide 40 Mg Tabs (Citalopram  Hydrobromide) .... Take 1 Tablet By Mouth Once A Day 6)  Alprazolam 0.25 Mg Tabs (Alprazolam) .... Take 1 Tablet By Mouth Three Times A Day 7)  Oxybutynin Chloride 10 Mg Xr24h-Tab (Oxybutynin Chloride) .... Take 1 Tablet By Mouth Once A Day 8)  Tylenol 325 Mg Tabs (Acetaminophen) .... Take 1 Tablet By Mouth Three Times A Day 9)  Antivert 25 Mg Tabs (Meclizine Hcl) .Marland Kitchen.. 1 Q 8 Hrs As Needed Dizziness 10)  Imdur 30 Mg Xr24h-Tab (Isosorbide Mononitrate) .... Take 1 Tablet By Mouth Once Daily 11)  Protonix 40 Mg Tbec (Pantoprazole Sodium) .... Take 1 Tab Daily 12)  Furosemide 40 Mg Tabs (Furosemide) .... Take 1 Tab Daily 13)  Lovastatin 10 Mg Tabs (Lovastatin) .... Take 1 Tab Daily 14)  Tamsulosin Hcl 0.4 Mg Caps (Tamsulosin Hcl) .... Take 1 Tab Daily 15)  Resource 2.0  Liqd (Nutritional Supplements) .... Drink 1 Can Three Times A Day 16)  Coumadin 4 Mg Tabs (Warfarin Sodium) .... Use As Directed 17)  Novolog 100 Unit/ml Soln (Insulin Aspart) .... Per Sliding Scale 18)  Tramadol Hcl 50 Mg Tabs (Tramadol Hcl) .... Take 1 Tab By Mouth At Bedtime  As Needed Fo Uncontrolled Pain 19)  Tessalon Perles 100 Mg Caps (Benzonatate) .... Take 1 Capsule By Mouth Three Times A Day 20)  Tussin Expectorant 100 Mg/55ml Syrp (Guaifenesin) .... Take 2 Tablespoons Every 6 Hours  21)  Milk of Magnesia .... Take 45 Ml As Needed 22)  Anti Diarrheal 23)  Antacid Original Liquid .... 2 Tablespoons As Needed 24)  Dimenhydrinate 50 Mg Tabs (Dimenhydrinate) .Marland Kitchen.. 1 Tab Every 4 Hours As Needed 25)  Triple Antibiotic Ointment 26)  Thick It 30 Oz .Marland Kitchen.. 1 To 2 Tablespoons For Each 120 Ml of Liquid 27)  Meclizine Hcl 25 Mg Tabs (Meclizine Hcl) .Marland Kitchen.. 1 Tab Every 8 Hours  Allergies (verified): No Known Drug Allergies  Review of Systems      See HPI General:  Complains of fatigue. Eyes:  Denies discharge, eye pain, and red eye. MS:  Complains of joint pain, low back pain, mid back pain, and stiffness; c/o increased and  uncontrolled leg and back pain. Psych:  Complains of anxiety; denies depression and mental problems. Endo:  Denies cold intolerance, excessive hunger, excessive thirst, and excessive urination. Heme:  Denies abnormal bruising and bleeding. Allergy:  Complains of seasonal allergies.  Physical Exam  General:  Well-developed,obese,in no acute distress; alert,appropriate and cooperative throughout examination.Pt is wheelchair bound HEENT: No facial asymmetry,  EOMI, No sinus tenderness, TM's Clear, oropharynx  pink and moist.Poor dentition  Chest: Clear to auscultation bilaterally.  CVS: S1, S2, No murmurs, No S3.   Abd: Soft, Nontender.Obese MS: decreased  ROM spine, hips, shoulders and knees.  Ext: No edema.   CNS: CN 2-12 intact,reduced power and tone in all 4 extremities, sensation is intact  Skin: Intact, no visible lesions or rashes.  Psych: Good eye contact, normal affect.  Memory loss not anxious or depressed appearing.    Impression & Recommendations:  Problem # 1:  OSTEOARTHRITIS, BACK (ICD-715.98) Assessment Deteriorated  The following medications were removed from the medication list:    Tramadol Hcl 50 Mg Tabs (Tramadol hcl) .Marland Kitchen... Take 1 tab by mouth at bedtime  as needed fo uncontrolled pain His updated medication list for this problem includes:    Tylenol 325 Mg Tabs (Acetaminophen) .Marland Kitchen... Take 1 tablet by mouth three times a day    Tramadol Hcl 50 Mg Tabs (Tramadol hcl) .Marland Kitchen... Take 1 tablet by mouth two times a day as needed for pain  Problem # 2:  CEREBROVASCULAR DISEASE (ICD-437.9) Assessment: Comment Only  Orders: Physical Therapy Referral (PT), worsening upper and lower ext weakness, necessitating power wheelchair  Problem # 3:  HYPERLIPIDEMIA (ICD-272.4) Assessment: Comment Only  His updated medication list for this problem includes:    Lovastatin 10 Mg Tabs (Lovastatin) .Marland Kitchen... Take 1 tab daily Low fat dietdiscussed and encouraged  Labs Reviewed: SGOT: 15  (03/08/2010)   SGPT: 15 (03/08/2010)   HDL:39 (10/21/2009), 48 (04/13/2009)  LDL:108 (10/21/2009), 119 (04/13/2009)  Chol:179 (10/21/2009), 193 (04/13/2009)  Trig:161 (10/21/2009), 132 (04/13/2009)  Problem # 4:  HYPERTENSION (ICD-401.9) Assessment: Unchanged  His updated medication list for this problem includes:    Carvedilol 25 Mg Tabs (Carvedilol) .Marland Kitchen... Take 1 tab two times a day    Lisinopril 40 Mg Tabs (Lisinopril) ..... One tab by mouth qd    Furosemide 40 Mg Tabs (Furosemide) .Marland Kitchen... Take 1 tab daily  BP today: 98/62 Prior BP: 100/70 (04/18/2010)  Labs Reviewed: K+: 3.9 (03/08/2010) Creat: : 1.20 (03/08/2010)   Chol: 179 (10/21/2009)   HDL: 39 (10/21/2009)   LDL: 108 (10/21/2009)   TG: 161 (10/21/2009)  Complete Medication List: 1)  Carvedilol 25 Mg Tabs (Carvedilol) .... Take 1 tab two times a day 2)  Coumadin 5 Mg Tabs (Warfarin sodium) .... Use as directed 3)  Colace 100 Mg Caps (Docusate sodium) .... Take 1 capsule by mouth two times a day 4)  Lisinopril 40 Mg Tabs (Lisinopril) .... One tab by mouth qd 5)  Citalopram Hydrobromide 40 Mg Tabs (Citalopram hydrobromide) .... Take 1 tablet by mouth once a day 6)  Alprazolam 0.25 Mg Tabs (Alprazolam) .... Take 1 tablet by mouth three times a day 7)  Oxybutynin Chloride 10 Mg Xr24h-tab (Oxybutynin chloride) .... Take 1 tablet by mouth once a day 8)  Tylenol 325 Mg Tabs (Acetaminophen) .... Take 1 tablet by mouth three times a day 9)  Antivert 25 Mg Tabs (Meclizine hcl) .Marland Kitchen.. 1 q 8 hrs as needed dizziness 10)  Imdur 30 Mg Xr24h-tab (Isosorbide mononitrate) .... Take 1 tablet by mouth once daily 11)  Protonix 40 Mg Tbec (Pantoprazole sodium) .... Take 1 tab daily 12)  Furosemide 40 Mg Tabs (Furosemide) .... Take 1 tab daily 13)  Lovastatin 10 Mg Tabs (Lovastatin) .... Take 1 tab daily 14)  Tamsulosin Hcl 0.4 Mg Caps (Tamsulosin hcl) .... Take 1 tab daily 15)  Resource 2.0 Liqd (Nutritional supplements) .... Drink 1 can three times a  day 16)  Coumadin 4 Mg Tabs (Warfarin sodium) .... Use as directed 17)  Novolog 100 Unit/ml Soln (Insulin aspart) .... Per sliding scale 18)  Tessalon Perles 100 Mg Caps (Benzonatate) .... Take 1 capsule by mouth three times a day 19)  Tussin Expectorant 100 Mg/79ml Syrp (Guaifenesin) .... Take 2 tablespoons every 6 hours 20)  Milk of Magnesia  .... Take 45 ml as needed 21)  Anti Diarrheal  22)  Antacid Original Liquid  .... 2 tablespoons as needed 23)  Dimenhydrinate 50 Mg Tabs (Dimenhydrinate) .Marland Kitchen.. 1 tab every 4 hours as needed 24)  Triple Antibiotic Ointment  25)  Thick It 30 Oz  .Marland KitchenMarland Kitchen. 1 to 2 tablespoons for each 120 ml of liquid 26)  Meclizine Hcl 25 Mg Tabs (Meclizine hcl) .Marland Kitchen.. 1 tab every 8 hours 27)  Tramadol Hcl 50 Mg Tabs (Tramadol hcl) .... Take 1 tablet by mouth two times a day as needed for pain 28)  Ciprofloxacin Hcl 500 Mg Tabs (Ciprofloxacin hcl) .... Take 1 tablet by mouth two times a day  Other Orders: Medicare Electronic Prescription [phone])  Patient Instructions: 1)  Please schedule a follow-up appointment in 4 months. 2)  You will be referred for eval for a PWC and we will get it from apothecary. 3)  You will be  referred  to physical therapy for eval Prescriptions: CIPROFLOXACIN HCL 500 MG TABS (CIPROFLOXACIN HCL) Take 1 tablet by mouth two times a day  #20 x 0   Entered and Authorized by:   Syliva Overman MD   Signed by:   Syliva Overman MD on 06/28/2010   Method used:   Electronically to        [facility] (retail)       [address]       [place], [place]  [address]       Ph: [phone]       Fax: [phone]   RxID:   [id number] TRAMADOL HCL 50 MG TABS (TRAMADOL HCL) Take 1 tablet by mouth two times a day as needed for pain  #60 x 4   Entered and Authorized by:   Syliva Overman MD   Signed by:   Syliva Overman MD on 06/20/2010   Method used:   Electronically to  [facility] (retail)       [address]       [place], [place]  [address]       Ph: [phone]       Fax: [phone]   RxID:   [id number]    Orders Added: 1)  Est. Patient Level IV [53664] 2)  Medicare Electronic Prescription [G8553] 3)  Physical Therapy Referral [PT]     Orders Added: 1)  Est. Patient Level IV [40347] 2)  Medicare Electronic Prescription [G8553] 3)  Physical Therapy Referral [PT]

## 2010-09-05 NOTE — Miscellaneous (Signed)
Summary: Nursing Home order  Nursing Home order   Imported By: Cammie Sickle 11/09/2009 18:05:43  _____________________________________________________________________  External Attachment:    Type:   Image     Comment:   External Document

## 2010-09-05 NOTE — Miscellaneous (Signed)
  Clinical Lists Changes  Medications: Removed medication of CIPROFLOXACIN HCL 500 MG TABS (CIPROFLOXACIN HCL) Take 1 tablet by mouth two times a day Added new medication of NITROFURANTOIN MACROCRYSTAL 100 MG CAPS (NITROFURANTOIN MACROCRYSTAL) Take 1 capsule by mouth two times a day - Signed Rx of NITROFURANTOIN MACROCRYSTAL 100 MG CAPS (NITROFURANTOIN MACROCRYSTAL) Take 1 capsule by mouth two times a day;  #20 x 0;  Signed;  Entered by: Syliva Overman MD;  Authorized by: Syliva Overman MD;  Method used: Electronically to [facility] [address], [place], [place], [place]  [address], Ph: [phone], Fax: [phone]    Prescriptions: NITROFURANTOIN MACROCRYSTAL 100 MG CAPS (NITROFURANTOIN MACROCRYSTAL) Take 1 capsule by mouth two times a day  #20 x 0   Entered and Authorized by:   Syliva Overman MD   Signed by:   Syliva Overman MD on 07/01/2010   Method used:   Electronically to        [facility] (retail)       [address]       [place], [place]  [address]       Ph: [phone]       Fax: [phone]   RxID:   [id number]

## 2010-09-05 NOTE — Progress Notes (Signed)
Summary: URINE IS DARK   Phone Note Call from Patient   Summary of Call: LEFT MESSAGE HOME AWAY FROM HOME SAID HIS URINE  IS DARK AND HAS A NASTY ODER TO IT  CALL BACK AT [phone] Initial call taken by: Lind Guest,  June 26, 2010 10:54 AM  Follow-up for Phone Call        advise submit cCUA today, nurse visit to dip urine if abn will send for c/s and go ahead and prescribe antibiotics today also. Needs to be seen today by nurse Follow-up by: Syliva Overman MD,  June 26, 2010 12:13 PM  Additional Follow-up for Phone Call Additional follow up Details #1::        PATIENT IS AWARE  WILL COME IN Additional Follow-up by: Lind Guest,  June 26, 2010 12:52 PM     Appended Document: URINE IS DARK  patient came in for nurse visit ua, unable to provide specimen, sent order and collection cup with patient and they will deliver to lab once available
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
## 2010-09-05 NOTE — Progress Notes (Signed)
Summary: pain  Phone Note Call from Patient   Summary of Call: pt needs something for pain. [phone] Initial call taken by: Rudene Anda,  May 11, 2010 11:31 AM  Follow-up for Phone Call        called no answer. Patient already on tylenol 325mg  three times a day as needed  Follow-up by: Everitt Amber LPN,  May 11, 2010 11:44 AM  Additional Follow-up for Phone Call Additional follow up Details #1::        Joann at [facility] said he does take tylenol three times a day 325mg  but it's not enough. He is going through all  his therapy and its alot on him and he is doing so well but needs something for pain. He used to take hydrocodone before the stroke but he needs something to help him now fax to [facility]  Att [facility]  Additional Follow-up by: Everitt Amber LPN,  May 12, 2010 10:09 AM    Additional Follow-up for Phone Call Additional follow up Details #2::    pls let them know one tramadol is sent in Follow-up by: Syliva Overman MD,  May 12, 2010 12:20 PM  Additional Follow-up for Phone Call Additional follow up Details #3:: Details for Additional Follow-up Action Taken: joann aware Additional Follow-up by: Adella Hare LPN,  May 12, 2010 2:05 PM  New/Updated Medications: TRAMADOL HCL 50 MG TABS (TRAMADOL HCL) Take 1 tab by mouth at bedtime  as needed fo uncontrolled pain Prescriptions: TRAMADOL HCL 50 MG TABS (TRAMADOL HCL) Take 1 tab by mouth at bedtime  as needed fo uncontrolled pain  #30 x 3   Entered and Authorized by:   Syliva Overman MD   Signed by:   Syliva Overman MD on 05/12/2010   Method used:   Electronically to        [facility] (retail)       [address]       [place], [place]  [address]       Ph: [phone]       Fax: [phone]   RxID:   [id number]

## 2010-09-05 NOTE — Progress Notes (Signed)
Summary: ringworm  Phone Note Call from Patient   Summary of Call: [facility] called and says pt has ringworm on back and can they get him something called in. [facility] [phone] Initial call taken by: Rudene Anda,  November 17, 2009 3:01 PM  Follow-up for Phone Call        I prescribed Nizoral cream to use two times a day. Follow-up by: Esperanza Sheets PA,  November 18, 2009 11:00 AM    New/Updated Medications: KETOCONAZOLE 2 % CREA (KETOCONAZOLE) apply two times a day to ringworm until rash has resolved Prescriptions: KETOCONAZOLE 2 % CREA (KETOCONAZOLE) apply two times a day to ringworm until rash has resolved  #15 gram x 0   Entered and Authorized by:   Esperanza Sheets PA   Signed by:   Esperanza Sheets PA on 11/18/2009   Method used:   Electronically to        [facility] (retail)       [address]       [place], [place]  [address]       Ph: [phone]       Fax: [phone]   RxID:   [id number]

## 2010-09-05 NOTE — Progress Notes (Signed)
Summary: MEDICINE  Phone Note Call from Patient   Summary of Call: [facility]  NEEDS FOR YOU TO CALL HER [phone] ABOUT HIS MEDICINE Initial call taken by: Lind Guest,  May 17, 2010 4:10 PM  Follow-up for Phone Call        called and left message  Follow-up by: Everitt Amber LPN,  May 18, 2010 2:47 PM  Additional Follow-up for Phone Call Additional follow up Details #1::        called again and was told she was not in the office ([facility]) and they could take another message. Already left one message for her. Will address when she calls back Additional Follow-up by: Everitt Amber LPN,  May 19, 2010 4:04 PM

## 2010-09-05 NOTE — Miscellaneous (Signed)
[facility]   Imported By: Lind Guest 04/26/2010 08:26:05  _____________________________________________________________________  External Attachment:    Type:   Image     Comment:   External Document

## 2010-09-05 NOTE — Letter (Signed)
Summary: 1 TOUCH METER  1 TOUCH METER   Imported By: Lind Guest 04/19/2010 14:27:50  _____________________________________________________________________  External Attachment:    Type:   Image     Comment:   External Document

## 2010-09-05 NOTE — Miscellaneous (Signed)
Summary: fl 2  fl 2   Imported By: Lind Guest 04/18/2010 11:16:19  _____________________________________________________________________  External Attachment:    Type:   Image     Comment:   External Document

## 2010-09-05 NOTE — Miscellaneous (Signed)
[facility]   Imported By: Lind Guest 04/26/2010 09:04:16  _____________________________________________________________________  External Attachment:    Type:   Image     Comment:   External Document

## 2010-09-05 NOTE — Progress Notes (Signed)
Summary: order  Phone Note Call from Patient   Summary of Call: joann from [facility] states pt needs dr. Lodema Hong to fax a order to [facility] for a three in one bedside com.  [phone] Initial call taken by: Rudene Anda,  April 12, 2010 2:19 PM  Follow-up for Phone Call        rx written, pls fax also pt needs ov way past due also flu vac available Follow-up by: Syliva Overman MD,  April 12, 2010 5:07 PM  Additional Follow-up for Phone Call Additional follow up Details #1::        faxed to [place] Additional Follow-up by: Everitt Amber LPN,  April 13, 2010 10:00 AM

## 2010-09-05 NOTE — Miscellaneous (Signed)
Summary: MED LIST  MED LIST   Imported By: Lind Guest 06/20/2010 10:53:09  _____________________________________________________________________  External Attachment:    Type:   Image     Comment:   External Document
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
## 2010-09-05 NOTE — Medication Information (Signed)
Summary: ccr-lr  Anticoagulant Therapy  Managed by: Vashti Hey, RN Supervising MD: Dietrich Pates MD, Molly Maduro Indication 1: Pulmonary Embolism and Infarction (ICD-415.1) Indication 2: stroke (ICD-436.0) Lab Used: [facility] [place] Site: [place] INR POC 2.1  Dietary changes: no    Health status changes: no    Bleeding/hemorrhagic complications: no    Recent/future hospitalizations: no    Any changes in medication regimen? no    Recent/future dental: no  Any missed doses?: no       Is patient compliant with meds? yes       Allergies: No Known Drug Allergies  Anticoagulation Management History:      The patient is taking warfarin and comes in today for a routine follow up visit.  Positive risk factors for bleeding include an age of 88 years or older.  The bleeding index is 'intermediate risk'.  Positive CHADS2 values include History of HTN.  Negative CHADS2 values include Age > 85 years old.  The start date was 06/30/2003.  Anticoagulation responsible provider: Dietrich Pates MD, Molly Maduro.  INR POC: 2.1.  Cuvette Lot#: [id number].  Exp: 10/11.    Anticoagulation Management Assessment/Plan:      The patient's current anticoagulation dose is Coumadin 5 mg tabs: Use as directed.  The target INR is 2 - 3.  The next INR is due 12/15/2009.  Anticoagulation instructions were given to patient.  Results were reviewed/authorized by Vashti Hey, RN.  He was notified by Vashti Hey RN.         Prior Anticoagulation Instructions: INR 2.1 Continue coumadin 5mg  once daily except 2.5mg  on Mondays, Wednesdays and Fridays  Current Anticoagulation Instructions: Same as Prior Instructions.

## 2010-09-05 NOTE — Medication Information (Signed)
Summary: Tax adviser   Imported By: Lind Guest 08/08/2009 14:39:37  _____________________________________________________________________  External Attachment:    Type:   Image     Comment:   External Document

## 2010-09-05 NOTE — Letter (Signed)
Summary: consults  consults   Imported By: Curtis Sites 01/04/2010 12:06:18  _____________________________________________________________________  External Attachment:    Type:   Image     Comment:   External Document

## 2010-09-05 NOTE — Assessment & Plan Note (Signed)
Summary: URINE  Nurse Visit   Allergies: No Known Drug Allergies specimen submitted at lab, nothing done in office, pt able to void, no charge

## 2010-09-05 NOTE — Letter (Signed)
Summary: misc.  misc.   Imported By: Curtis Sites 01/04/2010 12:16:50  _____________________________________________________________________  External Attachment:    Type:   Image     Comment:   External Document

## 2010-09-05 NOTE — Miscellaneous (Signed)
Summary: Home Care Report  Home Care Report   Imported By: Lind Guest 08/08/2009 14:39:58  _____________________________________________________________________  External Attachment:    Type:   Image     Comment:   External Document

## 2010-09-05 NOTE — Letter (Signed)
Summary: xray  xray   Imported By: Curtis Sites 01/04/2010 12:03:58  _____________________________________________________________________  External Attachment:    Type:   Image     Comment:   External Document

## 2010-09-05 NOTE — Medication Information (Signed)
Summary: ccr-lr  Anticoagulant Therapy  Managed by: Vashti Hey, RN Supervising MD: Dietrich Pates MD, Molly Maduro Indication 1: Pulmonary Embolism and Infarction (ICD-415.1) Indication 2: stroke (ICD-436.0) Lab Used: [facility] [place] Site: [place] INR POC 2.0  Dietary changes: no    Health status changes: no    Bleeding/hemorrhagic complications: no    Recent/future hospitalizations: no    Any changes in medication regimen? no    Recent/future dental: no  Any missed doses?: no       Is patient compliant with meds? yes       Allergies: No Known Drug Allergies  Anticoagulation Management History:      The patient is taking warfarin and comes in today for a routine follow up visit.  Positive risk factors for bleeding include an age of 70 years or older.  The bleeding index is 'intermediate risk'.  Positive CHADS2 values include History of HTN.  Negative CHADS2 values include Age > 30 years old.  The start date was 06/30/2003.  Anticoagulation responsible provider: Dietrich Pates MD, Molly Maduro.  INR POC: 2.0.  Cuvette Lot#: [id number].  Exp: 10/11.    Anticoagulation Management Assessment/Plan:      The patient's current anticoagulation dose is Coumadin 5 mg tabs: Use as directed.  The target INR is 2 - 3.  The next INR is due 01/12/2010.  Anticoagulation instructions were given to patient.  Results were reviewed/authorized by Vashti Hey, RN.  He was notified by Vashti Hey RN.         Prior Anticoagulation Instructions: INR 2.1 Continue coumadin 5mg  once daily except 2.5mg  on Mondays, Wednesdays and Fridays  Current Anticoagulation Instructions: INR 2.0 Continue coumadin 5mg  once daily except 2.5mg  on Mondays, Wednesdays and Fridays

## 2010-09-05 NOTE — Letter (Signed)
Summary: BEDSIDE COMMODE  BEDSIDE COMMODE   Imported By: Lind Guest 04/20/2010 10:37:59  _____________________________________________________________________  External Attachment:    Type:   Image     Comment:   External Document

## 2010-09-05 NOTE — Miscellaneous (Signed)
Summary: Home Care Report  Home Care Report   Imported By: Lind Guest 09/22/2009 09:42:32  _____________________________________________________________________  External Attachment:    Type:   Image     Comment:   External Document

## 2010-09-05 NOTE — Miscellaneous (Signed)
Summary: Home Care Report  Home Care Report   Imported By: Lind Guest 04/18/2010 11:15:55  _____________________________________________________________________  External Attachment:    Type:   Image     Comment:   External Document

## 2010-09-05 NOTE — Assessment & Plan Note (Signed)
Summary: eph post stress test  Medications Added IMDUR 30 MG XR24H-TAB (ISOSORBIDE MONONITRATE) take 1 tablet by mouth once daily      Allergies Added: NKDA  Visit Type:  Follow-up  CC:  no cardiology complaints.   Current Medications (verified): 1)  Coreg 12.5 Mg  Tabs (Carvedilol) .... One Tab By Mouth Two Times A Day 2)  Omeprazole 20 Mg  Cpdr (Omeprazole) .... One Tab By Mouth Once Daily 3)  Coumadin 5 Mg Tabs (Warfarin Sodium) .... Use As Directed 4)  Colace 100 Mg Caps (Docusate Sodium) .... Take 1 Capsule By Mouth Two Times A Day 5)  Crestor 10 Mg Tabs (Rosuvastatin Calcium) .... One Tab By Mouth Qhs 6)  Lisinopril 40 Mg Tabs (Lisinopril) .... One Tab By Mouth Qd 7)  Bisacodyl Ec 5 Mg Tbec (Bisacodyl) .... Take 2 Tabs At Bedtime 8)  Citalopram Hydrobromide 40 Mg Tabs (Citalopram Hydrobromide) .... Take 1 Tablet By Mouth Once A Day 9)  Alprazolam 0.25 Mg Tabs (Alprazolam) .... Take 1 Tablet By Mouth Three Times A Day 10)  Oxybutynin Chloride 10 Mg Xr24h-Tab (Oxybutynin Chloride) .... Take 1 Tablet By Mouth Once A Day 11)  Tylenol 325 Mg Tabs (Acetaminophen) .... Take 1 Tablet By Mouth Two Times A Day 12)  Hydrocodone-Acetaminophen 5-500 Mg Tabs (Hydrocodone-Acetaminophen) .... One Tab By Mouth Bid 13)  Uroxatral 10 Mg Xr24h-Tab (Alfuzosin Hcl) .... Take 1 Tab By Mouth At Bedtime 14)  Hydrochlorothiazide 25 Mg Tabs (Hydrochlorothiazide) .... Take 1 Tablet By Mouth Once A Day 15)  Ciprofloxacin Hcl 500 Mg Tabs (Ciprofloxacin Hcl) .... Take 1 Tablet By Mouth Two Times A Day 16)  Tylenol 325 Mg Tabs (Acetaminophen) .... Take 1 Tablet By Mouth Three Times A Day 17)  Antivert 25 Mg Tabs (Meclizine Hcl) .Marland Kitchen.. 1 Q 8 Hrs As Needed Dizziness 18)  Ketoconazole 2 % Crea (Ketoconazole) .... Apply Two Times A Day To Ringworm Until Rash Has Resolved 19)  Imdur 30 Mg Xr24h-Tab (Isosorbide Mononitrate) .... Take 1 Tablet By Mouth Once Daily  Allergies (verified): No Known Drug  Allergies  Vital Signs:  Patient profile:   71 year old male Pulse rate:   69 / minute BP sitting:   74 / 50  (right arm)  Vitals Entered By: Dreama Saa, CNA (January 10, 2010 2:57 PM)   Other Orders: T-Basic Metabolic Panel [phone])  Patient Instructions: 1)  Your physician recommends that you schedule a follow-up appointment in: 1 month 2)  Your physician recommends that you return for lab work in: one day this week 3)  Your physician has recommended you make the following change in your medication: Imdur to 30mg  by mouth once daily and stop taking Hydralazine if still taking 4)  Please check BP once every morning and once every evening. Report any BP's with a systolic lower than 100 (please send recorded BP's to next visit)

## 2010-09-05 NOTE — Progress Notes (Signed)
Summary: WANTS ADDITIONAL TYLENOL  Phone Note Call from Patient   Summary of Call: HE IS ON TYLENOL 325  MG AND HE IS ASKING FOR ADDITIONAL TYL.HE WOULD NEED AN ORDER FOR THIS  PHONE # [phone]  FAX # [phone]  TO [facility] Initial call taken by: Lind Guest,  September 08, 2009 10:53 AM  Follow-up for Phone Call        yes i have typed a new script to be sent to pharmacy with new directions,pls also hand write then stamp with my sig an order to the home with the new directions  Follow-up by: Syliva Overman MD,  September 08, 2009 12:21 PM  Additional Follow-up for Phone Call Additional follow up Details #1::        Rx Called In Additional Follow-up by: Worthy Keeler LPN,  September 08, 2009 1:56 PM    New/Updated Medications: TYLENOL 325 MG TABS (ACETAMINOPHEN) Take 1 tablet by mouth three times a day Prescriptions: TYLENOL 325 MG TABS (ACETAMINOPHEN) Take 1 tablet by mouth three times a day  #90 x 5   Entered and Authorized by:   Syliva Overman MD   Signed by:   Syliva Overman MD on 09/08/2009   Method used:   Printed then faxed to ...       [facility] (retail)       [address]       [place], [place]  [address]       Ph: [phone]       Fax: [phone]   RxID:   [id number]

## 2010-09-05 NOTE — Progress Notes (Signed)
Summary: POWER WHEEL CHAIR  Phone Note Call from Patient   Summary of Call: HAS A APPOINMENT  TODAY FOR A POWER WHEELCHAIR EVALUATION ON THE COVER SHEET THERE IS POINTS THAT NEED TO BE ADDRESSED  FOR MEDICARE COMPL.  IF YOU DID NOT RECIEVE THE FAX PLEASE CALL BACK [phone]  THIS A MESSAGE THAT WAS LEFT ON THE MACHINE Initial call taken by: Lind Guest,  June 20, 2010 8:35 AM  Follow-up for Phone Call        pt will get PWC from [place] Follow-up by: Syliva Overman MD,  June 21, 2010 6:21 AM

## 2010-09-05 NOTE — Miscellaneous (Signed)
Summary: Home Care Report  Home Care Report   Imported By: Lind Guest 10/05/2009 11:30:30  _____________________________________________________________________  External Attachment:    Type:   Image     Comment:   External Document

## 2010-09-05 NOTE — Letter (Signed)
Summary: demographic  demographic   Imported By: Curtis Sites 01/04/2010 11:58:47  _____________________________________________________________________  External Attachment:    Type:   Image     Comment:   External Document

## 2010-09-05 NOTE — Letter (Signed)
Summary: Custom - Delinquent Coumadin 1  [facility]  [address] [place] [address]   Phone: [phone]  Fax: [phone]     February 02, 2010 MRN: [id number]   [name] [name] [address] [place], [place]  [address]   Dear Mr. VIRANI,  This letter is being sent to you as a reminder that it is necessary for you to get your INR/PT checked regularly so that we can optimize your care.  Our records indicate that you were scheduled to have a test done recently.  As of today, we have not received the results of this test.  It is very important that you have your INR checked.  Please call our office at the number listed above to schedule an appointment at your earliest convenience.    If you have recently had your protime checked or have discontinued this medication, please contact our office at the above phone number to clarify this issue.  Thank you for this prompt attention to this important health care matter.  Sincerely, Vashti Hey RN  [facility] Team

## 2010-09-05 NOTE — Miscellaneous (Signed)
  Clinical Lists Changes  Medications: Added new medication of NOVOLOG 100 UNIT/ML SOLN (INSULIN ASPART) per sliding scale

## 2010-09-05 NOTE — Assessment & Plan Note (Signed)
Summary: office visit   Vital Signs:  Patient profile:   70 year old male Height:      67 inches Weight:      253.75 pounds BMI:     39.89 O2 Sat:      94 % Pulse rate:   75 / minute Pulse rhythm:   regular Resp:     16 per minute BP sitting:   122 / 80  (left arm) Cuff size:   large  Vitals Entered By: Everitt Amber LPN (October 19, 2009 2:24 PM)  Nutrition Counseling: Patient's BMI is greater than 25 and therefore counseled on weight management options. CC: Follow up chronic problems   CC:  Follow up chronic problems.  History of Present Illness: pt in for review of vertigo which has completely resolved.He has questions aboutweight loss. He denies depresson.  Reports  that he is currently doing well. Denies recent fever or chills. Denies sinus pressure, nasal congestion , ear pain or sore throat. Denies chest congestion, or cough productive of sputum. Denies chest pain, palpitations, PND, orthopnea or leg swelling. Denies abdominal pain, nausea, vomitting, diarrhea or constipation. Denies change in bowel movements or bloody stool. Denies dysuria , frequency, incontinence or hesitancy.  Denies headaches, vertigo, seizures. Denies depression, anxiety or insomnia.these are controlled by meds. Denies  rash, lesions, or itch. He still smokes regularly and has no quit date     Current Medications (verified): 1)  Coreg 12.5 Mg  Tabs (Carvedilol) .... One Tab By Mouth Two Times A Day 2)  Omeprazole 20 Mg  Cpdr (Omeprazole) .... One Tab By Mouth Once Daily 3)  Coumadin 5 Mg Tabs (Warfarin Sodium) .... Use As Directed 4)  Colace 100 Mg Caps (Docusate Sodium) .... Take 1 Capsule By Mouth Two Times A Day 5)  Crestor 10 Mg Tabs (Rosuvastatin Calcium) .... One Tab By Mouth Qhs 6)  Lisinopril 40 Mg Tabs (Lisinopril) .... One Tab By Mouth Qd 7)  Bisacodyl Ec 5 Mg Tbec (Bisacodyl) .... Take 2 Tabs At Bedtime 8)  Citalopram Hydrobromide 40 Mg Tabs (Citalopram Hydrobromide) .... Take 1  Tablet By Mouth Once A Day 9)  Alprazolam 0.25 Mg Tabs (Alprazolam) .... Take 1 Tablet By Mouth Three Times A Day 10)  Oxybutynin Chloride 10 Mg Xr24h-Tab (Oxybutynin Chloride) .... Take 1 Tablet By Mouth Once A Day 11)  Tylenol 325 Mg Tabs (Acetaminophen) .... Take 1 Tablet By Mouth Two Times A Day 12)  Hydrocodone-Acetaminophen 5-500 Mg Tabs (Hydrocodone-Acetaminophen) .... One Tab By Mouth Bid 13)  Uroxatral 10 Mg Xr24h-Tab (Alfuzosin Hcl) .... Take 1 Tab By Mouth At Bedtime 14)  Hydrochlorothiazide 25 Mg Tabs (Hydrochlorothiazide) .... Take 1 Tablet By Mouth Once A Day 15)  Ciprofloxacin Hcl 500 Mg Tabs (Ciprofloxacin Hcl) .... Take 1 Tablet By Mouth Two Times A Day 16)  Tylenol 325 Mg Tabs (Acetaminophen) .... Take 1 Tablet By Mouth Three Times A Day 17)  Antivert 25 Mg Tabs (Meclizine Hcl) .Marland Kitchen.. 1 Q 8 Hrs As Needed Dizziness  Allergies (verified): No Known Drug Allergies  Review of Systems Eyes:  Denies blurring and discharge. MS:  Complains of joint pain, low back pain, muscle aches, and stiffness. Psych:  Complains of anxiety, depression, and mental problems; denies easily angered, easily tearful, suicidal thoughts/plans, thoughts of violence, and unusual visions or sounds; controlled on meds. Endo:  Denies cold intolerance, excessive hunger, excessive thirst, excessive urination, heat intolerance, polyuria, and weight change. Heme:  Denies abnormal bruising and bleeding. Allergy:  Complains of seasonal allergies.  Physical Exam  General:  alert, cooperative to examination, and overweight-appearing. HEENT: No facial asymmetry,  EOMI, No sinus tenderness, TM's Clear, oropharynx  pink and moist.   Chest: Clear to auscultation bilaterally.  CVS: S1, S2, No murmurs, No S3.   Abd: Soft, Nontender.  MS: decreased  ROM spine, hips, shoulders and knees.  Ext: No edema.   CNS: CN 2-12 intact, power tone and sensation normal throughout.   Skin: Intact, no visible lesions or rashes.    Psych: Good eye contact, normal affect.  Memory intact, not anxious or depressed appearing.      Impression & Recommendations:  Problem # 1:  IMPAIRED FASTING GLUCOSE (ICD-790.21) Assessment Deteriorated  Orders: T- Hemoglobin A1C ([id number])  Problem # 2:  KNEE, ARTHRITIS, DEGEN./OSTEO (ICD-715.96) Assessment: Unchanged  His updated medication list for this problem includes:    Tylenol 325 Mg Tabs (Acetaminophen) .Marland Kitchen... Take 1 tablet by mouth two times a day    Hydrocodone-acetaminophen 5-500 Mg Tabs (Hydrocodone-acetaminophen) ..... One tab by mouth bid    Tylenol 325 Mg Tabs (Acetaminophen) .Marland Kitchen... Take 1 tablet by mouth three times a day  Problem # 3:  DEPRESSION (ICD-311) Assessment: Improved  His updated medication list for this problem includes:    Citalopram Hydrobromide 40 Mg Tabs (Citalopram hydrobromide) .Marland Kitchen... Take 1 tablet by mouth once a day    Alprazolam 0.25 Mg Tabs (Alprazolam) .Marland Kitchen... Take 1 tablet by mouth three times a day  Problem # 4:  OBESITY (ICD-278.00) Assessment: Unchanged  Ht: 67 (10/19/2009)   Wt: 253.75 (10/19/2009)   BMI: 39.89 (10/19/2009)  Problem # 5:  NICOTINE ADDICTION (ICD-305.1) Assessment: Unchanged  Encouraged smoking cessation and discussed different methods for smoking cessation.   Problem # 6:  HYPERTENSION (ICD-401.9) Assessment: Unchanged  His updated medication list for this problem includes:    Coreg 12.5 Mg Tabs (Carvedilol) ..... One tab by mouth two times a day    Lisinopril 40 Mg Tabs (Lisinopril) ..... One tab by mouth qd    Hydrochlorothiazide 25 Mg Tabs (Hydrochlorothiazide) .Marland Kitchen... Take 1 tablet by mouth once a day  Orders: T-Basic Metabolic Panel [phone])  BP today: 122/80 Prior BP: 110/70 (10/04/2009)  Labs Reviewed: K+: 4.5 (04/13/2009) Creat: : 1.03 (04/13/2009)   Chol: 193 (04/13/2009)   HDL: 48 (04/13/2009)   LDL: 119 (04/13/2009)   TG: 132 (04/13/2009)  Problem # 7:  HYPERLIPIDEMIA  (ICD-272.4) Assessment: Comment Only  His updated medication list for this problem includes:    Crestor 10 Mg Tabs (Rosuvastatin calcium) ..... One tab by mouth qhs  Orders: T-Hepatic Function [id number]) T-Lipid Profile [phone])  Labs Reviewed: SGOT: 13 (04/13/2009)   SGPT: 11 (04/13/2009)   HDL:48 (04/13/2009), 43 (11/24/2008)  LDL:119 (04/13/2009), 148 (11/24/2008)  Chol:193 (04/13/2009), 216 (11/24/2008)  Trig:132 (04/13/2009), 125 (11/24/2008)  Complete Medication List: 1)  Coreg 12.5 Mg Tabs (Carvedilol) .... One tab by mouth two times a day 2)  Omeprazole 20 Mg Cpdr (Omeprazole) .... One tab by mouth once daily 3)  Coumadin 5 Mg Tabs (Warfarin sodium) .... Use as directed 4)  Colace 100 Mg Caps (Docusate sodium) .... Take 1 capsule by mouth two times a day 5)  Crestor 10 Mg Tabs (Rosuvastatin calcium) .... One tab by mouth qhs 6)  Lisinopril 40 Mg Tabs (Lisinopril) .... One tab by mouth qd 7)  Bisacodyl Ec 5 Mg Tbec (Bisacodyl) .... Take 2 tabs at bedtime 8)  Citalopram Hydrobromide 40 Mg Tabs (Citalopram hydrobromide) .... Take  1 tablet by mouth once a day 9)  Alprazolam 0.25 Mg Tabs (Alprazolam) .... Take 1 tablet by mouth three times a day 10)  Oxybutynin Chloride 10 Mg Xr24h-tab (Oxybutynin chloride) .... Take 1 tablet by mouth once a day 11)  Tylenol 325 Mg Tabs (Acetaminophen) .... Take 1 tablet by mouth two times a day 12)  Hydrocodone-acetaminophen 5-500 Mg Tabs (Hydrocodone-acetaminophen) .... One tab by mouth bid 13)  Uroxatral 10 Mg Xr24h-tab (Alfuzosin hcl) .... Take 1 tab by mouth at bedtime 14)  Hydrochlorothiazide 25 Mg Tabs (Hydrochlorothiazide) .... Take 1 tablet by mouth once a day 15)  Ciprofloxacin Hcl 500 Mg Tabs (Ciprofloxacin hcl) .... Take 1 tablet by mouth two times a day 16)  Tylenol 325 Mg Tabs (Acetaminophen) .... Take 1 tablet by mouth three times a day 17)  Antivert 25 Mg Tabs (Meclizine hcl) .Marland Kitchen.. 1 q 8 hrs as needed dizziness  Patient  Instructions: 1)  Please schedule a follow-up appointment in 3.5 months. 2)  You need to lose weight. Consider a lower calorie diet and regular exercise.  3)  BMP prior to visit, ICD-9: 4)  Hepatic Panel prior to visit, ICD-9: fasting asap 5)  Lipid Panel prior to visit, ICD-9: 6)  HbgA1C prior to visit, ICD-9: 7)  i am glad that you feel better, no med changes.

## 2010-09-05 NOTE — Miscellaneous (Signed)
Summary: FL 2  FL 2   Imported By: Lind Guest 04/21/2010 12:53:02  _____________________________________________________________________  External Attachment:    Type:   Image     Comment:   External Document

## 2010-09-05 NOTE — Medication Information (Signed)
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 no    Recent/future dental: no  Any missed doses?: no       Is patient compliant with meds? yes       Allergies: No Known Drug Allergies  Anticoagulation Management History:      The patient is taking warfarin and comes in today for a routine follow up visit.  Positive risk factors for bleeding include an age of 70 years or older.  The bleeding index is 'intermediate risk'.  Positive CHADS2 values include History of HTN.  Negative CHADS2 values include Age > 8 years old.  The start date was 06/30/2003.  Anticoagulation responsible provider: Dietrich Pates MD, Molly Maduro.  INR POC: 2.5.  Cuvette Lot#: [id number].  Exp: 10/11.    Anticoagulation Management Assessment/Plan:      The patient's current anticoagulation dose is Coumadin 5 mg tabs: Use as directed.  The target INR is 2 - 3.  The next INR is due 09/22/2009.  Anticoagulation instructions were given to patient.  Results were reviewed/authorized by Vashti Hey, RN.  He was notified by Vashti Hey RN.         Prior Anticoagulation Instructions: INR 2.1 Continue coumadin 5mg  once daily except 2.5mg  on Mondays, Wednesdays and Fridays  Current Anticoagulation Instructions: INR 2.5 Continue coumadin 5mg  once daily except 2.5mg  on Mondays, Wednesdays and Fridays
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
## 2010-09-05 NOTE — Letter (Signed)
Summary: transferred records  transferred records   Imported By: Lind Guest 04/04/2010 13:34:00  _____________________________________________________________________  External Attachment:    Type:   Image     Comment:   External Document

## 2010-09-05 NOTE — Letter (Signed)
Summary: progress notes  progress notes   Imported By: Curtis Sites 01/04/2010 12:03:17  _____________________________________________________________________  External Attachment:    Type:   Image     Comment:   External Document

## 2010-09-05 NOTE — Progress Notes (Signed)
Summary: home away from home  Phone Note Call from Patient   Summary of Call: left message joanne from home away from home needs you to call her at [phone] Initial call taken by: Lind Guest,  May 12, 2010 10:29 AM  Follow-up for Phone Call        have already spoken with her regarding [place]. Msg sent to Dr. Lodema Hong Follow-up by: Everitt Amber LPN,  May 12, 2010 11:12 AM

## 2010-09-05 NOTE — Miscellaneous (Signed)
Summary: refill  Clinical Lists Changes  Medications: Rx of HYDROCODONE-ACETAMINOPHEN 5-500 MG TABS (HYDROCODONE-ACETAMINOPHEN) one tab by mouth bid;  #60 x 1;  Signed;  Entered by: Everitt Amber;  Authorized by: Syliva Overman MD;  Method used: Printed then faxed to [facility] [address], [place], [place], [place]  [address], Ph: [phone], Fax: [phone]    Prescriptions: HYDROCODONE-ACETAMINOPHEN 5-500 MG TABS (HYDROCODONE-ACETAMINOPHEN) one tab by mouth bid  #60 x 1   Entered by:   Everitt Amber   Authorized by:   Syliva Overman MD   Signed by:   Everitt Amber on 08/10/2009   Method used:   Printed then faxed to ...       [facility] (retail)       [address]       [place], [place]  [address]       Ph: [phone]       Fax: [phone]   RxID:   [id number]

## 2010-09-05 NOTE — Medication Information (Signed)
Summary: ccr-lr  Anticoagulant Therapy  Managed by: Vashti Hey, RN Supervising MD: Daleen Squibb MD, Maisie Fus Indication 1: Pulmonary Embolism and Infarction (ICD-415.1) Indication 2: stroke (ICD-436.0) Lab Used: [facility] [place] Site: [place] INR POC 2.4  Dietary changes: no    Health status changes: no    Bleeding/hemorrhagic complications: no    Recent/future hospitalizations: no    Any changes in medication regimen? no    Recent/future dental: no  Any missed doses?: no       Is patient compliant with meds? yes       Allergies: No Known Drug Allergies  Anticoagulation Management History:      The patient is taking warfarin and comes in today for a routine follow up visit.  Positive risk factors for bleeding include an age of 8 years or older.  The bleeding index is 'intermediate risk'.  Positive CHADS2 values include History of HTN.  Negative CHADS2 values include Age > 58 years old.  The start date was 06/30/2003.  Anticoagulation responsible provider: Daleen Squibb MD, Maisie Fus.  INR POC: 2.4.  Cuvette Lot#: [id number].  Exp: 10/11.    Anticoagulation Management Assessment/Plan:      The patient's current anticoagulation dose is Coumadin 5 mg tabs: Use as directed.  The target INR is 2 - 3.  The next INR is due 10/20/2009.  Anticoagulation instructions were given to patient.  Results were reviewed/authorized by Vashti Hey, RN.  He was notified by Vashti Hey RN.         Prior Anticoagulation Instructions: INR 2.5 Continue coumadin 5mg  once daily except 2.5mg  on Mondays, Wednesdays and Fridays  Current Anticoagulation Instructions: INR 2.4 Continue coumadin 5mg  once daily except 2.5mg  on Mondays, Wednesdays and Fridays

## 2010-09-06 ENCOUNTER — Encounter: Payer: Self-pay | Admitting: Family Medicine

## 2010-09-06 ENCOUNTER — Encounter: Payer: Self-pay | Admitting: Cardiology

## 2010-09-06 ENCOUNTER — Ambulatory Visit: Admit: 2010-09-06 | Payer: Self-pay

## 2010-09-06 ENCOUNTER — Encounter (INDEPENDENT_AMBULATORY_CARE_PROVIDER_SITE_OTHER): Payer: Medicare Other

## 2010-09-06 DIAGNOSIS — I2699 Other pulmonary embolism without acute cor pulmonale: Secondary | ICD-10-CM

## 2010-09-06 DIAGNOSIS — Z7901 Long term (current) use of anticoagulants: Secondary | ICD-10-CM

## 2010-09-07 NOTE — Progress Notes (Signed)
Summary: coumdin  Phone Note Call from Patient   Summary of Call: needs a [facility] for [facility] [phone] att Peggyann Shoals [phone] Initial call taken by: Rudene Anda,  August 28, 2010 10:57 AM  Follow-up for Phone Call        Advised she needed to call Coumadin clinic, that we were no longer prescribing his coumadin Follow-up by: Everitt Amber LPN,  August 28, 2010 11:51 AM

## 2010-09-07 NOTE — Assessment & Plan Note (Signed)
Summary: per jamie  Nurse Visit   Vital Signs:  Patient profile:   70 year old male Weight:      182.75 pounds BP sitting:   100 / 70  (left arm) CC: very strong urine odor Comments patient unable to collect in office, sent cup and order home with patient   Allergies: No Known Drug Allergies  Orders Added: 1)  T-Urinalysis with Culture Reflex [[id number]]  patient will not be charged for this visit

## 2010-09-07 NOTE — Progress Notes (Signed)
  Phone Note Call from Patient   Caller: Patient Summary of Call: Joann from [facility] called, states patient's urine has strong odor, no other symptoms advised if he becomes symptomatic or runs fever, urgent care or er otherwise nurse visit [facility] monday morning Initial call taken by: Adella Hare LPN,  August 11, 2010 3:47 PM

## 2010-09-07 NOTE — Progress Notes (Signed)
  Phone Note Call from Patient   Summary of Call: Patient has app Jan 13 2:15 at coumadin clinic to resume coumadin management. Will call in 1 tablet of coumadin 4mg  to be taken to last until tomorrow. will d/c novalog sliding scale. Joann at facility aware Initial call taken by: Everitt Amber LPN,  August 17, 2010 10:29 AM

## 2010-09-07 NOTE — Progress Notes (Signed)
  Phone Note Other Incoming   Caller: dr simpson Summary of Call: call pt and facility advise needs cbc and diff stat todAY DUE TO Uti, NEEDS TO BE DONE Ibefore 12 midday. let them know he willned appt with dr Rito Ehrlich due to uti, also i have sent in antibiotics forhim to start,may need to be changed if sensitivity is negative, pls do asap, impt Initial call taken by: Syliva Overman MD,  August 16, 2010 3:50 AM  Follow-up for Phone Call        Joann at facility aware and will take him before noon today. Made app with Hermenia Fiscal and will get antibiotics for him today Follow-up by: Everitt Amber LPN,  August 16, 2010 9:45 AM

## 2010-09-07 NOTE — Medication Information (Signed)
Summary: CCR HAS NOT BEEN CHECKED SINCE MAY  Anticoagulant Therapy  Managed by: Vashti Hey, RN PCP: Dr. Syliva Overman Supervising MD: Diona Browner MD, Remi Deter Indication 1: Pulmonary Embolism and Infarction (ICD-415.1) Indication 2: stroke (ICD-436.0) Lab Used: [facility] [place] Site: [place] INR POC 3.5  Dietary changes: no    Health status changes: no    Bleeding/hemorrhagic complications: no    Recent/future hospitalizations: yes       Details: Pt had CVA on coumadin May 2011  He has been in [facility] [place] [facility] [place] since.  [facility]  Any changes in medication regimen? yes       Details: On Macrobid 100mg  bid x 10 days for UTI  Recent/future dental: no  Any missed doses?: no       Is patient compliant with meds? yes       Allergies: No Known Drug Allergies  Anticoagulation Management History:      The patient is taking warfarin and comes in today for a routine follow up visit.  Positive risk factors for bleeding include an age of 70 years or older.  The bleeding index is 'intermediate risk'.  Positive CHADS2 values include History of HTN.  Negative CHADS2 values include Age > 70 years old.  The start date was 06/30/2003.  Anticoagulation responsible provider: Diona Browner MD, Remi Deter.  INR POC: 3.5.  Cuvette Lot#: [id number].  Exp: 10/11.    Anticoagulation Management Assessment/Plan:      The patient's current anticoagulation dose is Coumadin 5 mg tabs: Use as directed, Coumadin 4 mg tabs: use as directed.  The target INR is 2 - 3.  The next INR is due 08/24/2010.  Anticoagulation instructions were given to patient.  Results were reviewed/authorized by Vashti Hey, RN.  He was notified by Vashti Hey RN.         Prior Anticoagulation Instructions: INR 2.0 Continue coumadin 5mg  once daily except 2.5mg  on Mondays, Wednesdays and Fridays  Current Anticoagulation Instructions: INR 3.5 On Macrobid 100mg  two times a day x 10  days  Finishes 08/25/10 Hold coumadin tonight then decrease dose to 4mg  once daily

## 2010-09-07 NOTE — Progress Notes (Signed)
  Phone Note Other Incoming   Caller: dr simpson Summary of Call: pls check [facility] on line opr with pharnmacy, let me know alt for pantoprazole andlovastatin Initial call taken by: Syliva Overman MD,  August 22, 2010 5:42 PM  Follow-up for Phone Call        Covered alternatives-- Omeprazole/Dexillant (replaces protoniox) Simvastatin/Pravastatin (replaces lovastatin)  Provide D/C order for old meds when sending new ones.  Follow-up by: Everitt Amber LPN,  August 24, 2010 11:33 AM  Additional Follow-up for Phone Call Additional follow up Details #1::        changes entered historically and ordr in your box to d/c the meds Additional Follow-up by: Syliva Overman MD,  August 24, 2010 12:28 PM    Additional Follow-up for Phone Call Additional follow up Details #2::    faxed meds and d/c order Follow-up by: Everitt Amber LPN,  August 24, 2010 12:48 PM  New/Updated Medications: DEXILANT 60 MG CPDR (DEXLANSOPRAZOLE) Take 1 capsule by mouth once a day PRAVASTATIN SODIUM 40 MG TABS (PRAVASTATIN SODIUM) Take 1 tab by mouth at bedtime Prescriptions: PRAVASTATIN SODIUM 40 MG TABS (PRAVASTATIN SODIUM) Take 1 tab by mouth at bedtime  #30 x 3   Entered by:   Everitt Amber LPN   Authorized by:   Syliva Overman MD   Signed by:   Everitt Amber LPN on 16/05/9603   Method used:   Printed then faxed to ...       [facility] (retail)       [address]       [place], [place]  [address]       Ph: [phone]       Fax: [phone]   RxID:   [id number] DEXILANT 60 MG CPDR (DEXLANSOPRAZOLE) Take 1 capsule by mouth once a day  #30 x 3   Entered by:   Everitt Amber LPN   Authorized by:   Syliva Overman MD   Signed by:   Everitt Amber LPN on 06/02/2535   Method used:   Printed then faxed to ...       [facility] (retail)       [address]       [place], [place]  [address]       Ph: [phone]       Fax:  [phone]   RxID:   [id number] PRAVASTATIN SODIUM 40 MG TABS (PRAVASTATIN SODIUM) Take 1 tab by mouth at bedtime  #30 x 3   Entered and Authorized by:   Syliva Overman MD   Signed by:   Syliva Overman MD on 08/24/2010   Method used:   Historical   RxID:   [id number] DEXILANT 60 MG CPDR (DEXLANSOPRAZOLE) Take 1 capsule by mouth once a day  #30 x 3   Entered and Authorized by:   Syliva Overman MD   Signed by:   Syliva Overman MD on 08/24/2010   Method used:   Historical   RxID:   [id number]

## 2010-09-07 NOTE — Miscellaneous (Signed)
  Clinical Lists Changes  Medications: Removed medication of NITROFURANTOIN MACROCRYSTAL 100 MG CAPS (NITROFURANTOIN MACROCRYSTAL) Take 1 capsule by mouth two times a day Added new medication of AMPICILLIN 500 MG CAPS (AMPICILLIN) Take 1 capsule by mouth three times a day - Signed Rx of AMPICILLIN 500 MG CAPS (AMPICILLIN) Take 1 capsule by mouth three times a day;  #420 x 0;  Signed;  Entered by: Syliva Overman MD;  Authorized by: Syliva Overman MD;  Method used: Historical    Prescriptions: AMPICILLIN 500 MG CAPS (AMPICILLIN) Take 1 capsule by mouth three times a day  #420 x 0   Entered and Authorized by:   Syliva Overman MD   Signed by:   Syliva Overman MD on 08/20/2010   Method used:   Historical   RxID:   [id number]

## 2010-09-07 NOTE — Miscellaneous (Signed)
Summary: fl 2 paper  fl 2 paper   Imported By: Lind Guest 08/22/2010 11:05:54  _____________________________________________________________________  External Attachment:    Type:   Image     Comment:   External Document

## 2010-09-07 NOTE — Letter (Signed)
Summary: mobility equipment  mobility equipment   Imported By: Lind Guest 07/28/2010 08:41:56  _____________________________________________________________________  External Attachment:    Type:   Image     Comment:   External Document

## 2010-09-07 NOTE — Letter (Signed)
Summary: D/C MEDICINE  D/C MEDICINE   Imported By: Lind Guest 08/21/2010 15:01:44  _____________________________________________________________________  External Attachment:    Type:   Image     Comment:   External Document

## 2010-09-07 NOTE — Letter (Signed)
Summary: D/C NOVALOG  D/C NOVALOG   Imported By: Lind Guest 08/17/2010 15:36:39  _____________________________________________________________________  External Attachment:    Type:   Image     Comment:   External Document

## 2010-09-07 NOTE — Progress Notes (Signed)
Summary: Refill  Medications Added COUMADIN 4 MG TABS (WARFARIN SODIUM) Take 1 tablet by mouth once a day' or as directed by anticoagulation clinic       Phone Note Call from Patient   Caller: Patient Reason for Call: Refill Medication Summary of Call:  needs a order for coumdin [facility] to [facility] [phone] att Peggyann Shoals [phone] per message left with [facility] / tg Initial call taken by: Raechel Ache [facility],  August 28, 2010 2:30 PM    New/Updated Medications: COUMADIN 4 MG TABS (WARFARIN SODIUM) Take 1 tablet by mouth once a day' or as directed by anticoagulation clinic Prescriptions: COUMADIN 4 MG TABS (WARFARIN SODIUM) Take 1 tablet by mouth once a day' or as directed by anticoagulation clinic  #45 x 3   Entered by:   Teressa Lower RN   Authorized by:   Kathlen Brunswick, MD, [facility]   Signed by:   Teressa Lower RN on 08/28/2010   Method used:   Electronically to        [facility] (retail)       [address]       [place], [place]  [address]       Ph: [phone]       Fax: [phone]   RxID:   [id number]

## 2010-09-07 NOTE — Progress Notes (Signed)
Summary: rx care  Phone Note Call from Patient   Summary of Call: [facility] called and needs for you to call them back about his warfin medicine the 5 mg and 4mg  call back at [phone] i think his name was [name] [name] Initial call taken by: Lind Guest,  August 16, 2010 10:16 AM  Follow-up for Phone Call        spoke with coumadin clinic and they have not seen patient since May 2011 Follow-up by: Adella Hare LPN,  August 16, 2010 10:53 AM  Additional Follow-up for Phone Call Additional follow up Details #1::        pls contact coumadin clinic,as a priority this morning for pt to resume coumadin monitoring there, hopefully to be seen this morning , then they sendin appropriate coumadin prescription.this is extremely impt and i need to know if this will not be completed this morning so i can tak e further action. let [facility] know the outcome if they resumee coumadin management Additional Follow-up by: Syliva Overman MD,  August 17, 2010 5:31 AM    Additional Follow-up for Phone Call Additional follow up Details #2::    aide called in this morning and states he went and had blood work done yesterday.  She states he needs his warfin.  Please notify Randa Evens at [phone]  or [phone]. Follow-up by: Rudene Anda,  August 17, 2010 9:04 AM  Additional Follow-up for Phone Call Additional follow up Details #3:: Details for Additional Follow-up Action Taken: pls send in the script for today only for the one coumadin tablet he is to take today, let pt and pharmacy know, verify whether it is the 4mg  tab or the 5 mg tab pls, since this action has already been taken I will sign off Additional Follow-up by: Syliva Overman MD,  August 17, 2010 12:09 PM

## 2010-09-07 NOTE — Letter (Signed)
Summary: DISCONTINUE MEDICATION  DISCONTINUE MEDICATION   Imported By: Lind Guest 08/28/2010 17:12:10  _____________________________________________________________________  External Attachment:    Type:   Image     Comment:   External Document

## 2010-09-07 NOTE — Medication Information (Signed)
Summary: ccr-lr  Anticoagulant Therapy  Managed by: Vashti Hey, RN PCP: Dr. 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 no    Recent/future dental: no  Any missed doses?: no       Is patient compliant with meds? yes       Allergies: No Known Drug Allergies  Anticoagulation Management History:      The patient is taking warfarin and comes in today for a routine follow up visit.  Positive risk factors for bleeding include an age of 69 years or older.  The bleeding index is 'intermediate risk'.  Positive CHADS2 values include History of HTN.  Negative CHADS2 values include Age > 15 years old.  The start date was 06/30/2003.  Anticoagulation responsible provider: Dietrich Pates MD, Molly Maduro.  INR POC: 2.7.  Cuvette Lot#: [id number].  Exp: 10/11.    Anticoagulation Management Assessment/Plan:      The patient's current anticoagulation dose is Coumadin 5 mg tabs: Use as directed, Coumadin 4 mg tabs: use as directed.  The target INR is 2 - 3.  The next INR is due 09/06/2010.  Anticoagulation instructions were given to patient.  Results were reviewed/authorized by Vashti Hey, RN.  He was notified by Vashti Hey RN.         Prior Anticoagulation Instructions: INR 3.5 On Macrobid 100mg  two times a day x 10 days  Finishes 08/25/10 Hold coumadin tonight then decrease dose to 4mg  once daily   Current Anticoagulation Instructions: INR 2.7 Continue coumadin 4mg  once daily

## 2010-09-07 NOTE — Progress Notes (Signed)
  Phone Note Other Incoming   Caller: dr simpson Summary of Call: pls advise pt and faciliy, he appears to have another urinary tract infection, and wil need to see a urologist, he has seen dr Rito Ehrlich inthe past pls refer , i will send i the order Pls print a copy of his cCUA and fax to dr Rito Ehrlich also let them know I will start an antibiotic until we get the final culture report. pls put him on to the nurse regarding the med and further lab tests needed Initial call taken by: Syliva Overman MD,  August 16, 2010 3:44 AM  Follow-up for Phone Call        facility aware Follow-up by: Adella Hare LPN,  August 16, 2010 11:48 AM  New Problems: URINARY TRACT INFECTION, ACUTE, RECURRENT (ICD-599.0)   New Problems: URINARY TRACT INFECTION, ACUTE, RECURRENT (ICD-599.0) New/Updated Medications: NITROFURANTOIN MACROCRYSTAL 100 MG CAPS (NITROFURANTOIN MACROCRYSTAL) Take 1 capsule by mouth two times a day Prescriptions: NITROFURANTOIN MACROCRYSTAL 100 MG CAPS (NITROFURANTOIN MACROCRYSTAL) Take 1 capsule by mouth two times a day  #20 x 0   Entered and Authorized by:   Syliva Overman MD   Signed by:   Syliva Overman MD on 08/16/2010   Method used:   Electronically to        [facility] (retail)       [address]       [place], [place]  [address]       Ph: [phone]       Fax: [phone]   RxID:   [id number]

## 2010-09-13 NOTE — Miscellaneous (Signed)
Summary: Home Care Report  Home Care Report   Imported By: Lind Guest 09/06/2010 09:24:21  _____________________________________________________________________  External Attachment:    Type:   Image     Comment:   External Document

## 2010-09-13 NOTE — Medication Information (Signed)
Summary: ccr/rm  Anticoagulant Therapy  Managed by: Vashti Hey, RN PCP: Dr. 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 yes       Details: On amoxicillin tid   Recent/future dental: no  Any missed doses?: no       Is patient compliant with meds? yes       Allergies: No Known Drug Allergies  Anticoagulation Management History:      The patient is taking warfarin and comes in today for a routine follow up visit.  Positive risk factors for bleeding include an age of 70 years or older.  The bleeding index is 'intermediate risk'.  Positive CHADS2 values include History of HTN.  Negative CHADS2 values include Age > 70 years old.  The start date was 06/30/2003.  Anticoagulation responsible provider: Diona Browner MD, Remi Deter.  INR POC: 1.9.  Cuvette Lot#: [id number].  Exp: 10/11.    Anticoagulation Management Assessment/Plan:      The patient's current anticoagulation dose is Coumadin 4 mg tabs: Take 1 tablet by mouth once a day' or as directed by anticoagulation clinic, Coumadin 4 mg tabs: use as directed.  The target INR is 2 - 3.  The next INR is due 10/04/2010.  Anticoagulation instructions were given to patient.  Results were reviewed/authorized by Vashti Hey, RN.  He was notified by Vashti Hey RN.         Prior Anticoagulation Instructions: INR 2.7 Continue coumadin 4mg  once daily   Current Anticoagulation Instructions: INR 1.9 Take coumadin 1 1/2 tablets tonight then resume 1 tablet once daily

## 2010-09-15 ENCOUNTER — Encounter: Payer: Self-pay | Admitting: Family Medicine

## 2010-09-21 NOTE — Letter (Signed)
[facility]: [facility]   Imported By: Lind Guest 09/11/2010 13:53:39  _____________________________________________________________________  External Attachment:    Type:   Image     Comment:   External Document

## 2010-09-21 NOTE — Miscellaneous (Signed)
Summary: [facility] from home  [facility]   Imported By: Lind Guest 09/15/2010 09:05:52  _____________________________________________________________________  External Attachment:    Type:   Image     Comment:   External Document

## 2010-10-04 ENCOUNTER — Encounter: Payer: Self-pay | Admitting: Cardiology

## 2010-10-04 ENCOUNTER — Encounter (INDEPENDENT_AMBULATORY_CARE_PROVIDER_SITE_OTHER): Payer: Medicare Other

## 2010-10-04 ENCOUNTER — Telehealth: Payer: Self-pay | Admitting: Family Medicine

## 2010-10-04 DIAGNOSIS — I2699 Other pulmonary embolism without acute cor pulmonale: Secondary | ICD-10-CM

## 2010-10-04 DIAGNOSIS — Z7901 Long term (current) use of anticoagulants: Secondary | ICD-10-CM

## 2010-10-04 DIAGNOSIS — I6789 Other cerebrovascular disease: Secondary | ICD-10-CM

## 2010-10-10 ENCOUNTER — Encounter: Payer: Self-pay | Admitting: Family Medicine

## 2010-10-11 ENCOUNTER — Encounter: Payer: Self-pay | Admitting: Family Medicine

## 2010-10-12 NOTE — Medication Information (Signed)
Summary: ccr-lr  Anticoagulant Therapy  Managed by: Vashti Hey, RN PCP: Dr. 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 no    Recent/future dental: no  Any missed doses?: no       Is patient compliant with meds? yes       Allergies: No Known Drug Allergies  Anticoagulation Management History:      The patient is taking warfarin and comes in today for a routine follow up visit.  Positive risk factors for bleeding include an age of 70 years or older.  The bleeding index is 'intermediate risk'.  Positive CHADS2 values include History of HTN.  Negative CHADS2 values include Age > 70 years old.  The start date was 06/30/2003.  Anticoagulation responsible provider: Dietrich Pates MD, Molly Maduro.  INR POC: 3.2.  Cuvette Lot#: [id number].  Exp: 10/11.    Anticoagulation Management Assessment/Plan:      The patient's current anticoagulation dose is Coumadin 4 mg tabs: Take 1 tablet by mouth once a day' or as directed by anticoagulation clinic, Coumadin 4 mg tabs: use as directed.  The target INR is 2 - 3.  The next INR is due 11/01/2010.  Anticoagulation instructions were given to patient.  Results were reviewed/authorized by Vashti Hey, RN.  He was notified by Vashti Hey RN.         Prior Anticoagulation Instructions: INR 1.9 Take coumadin 1 1/2 tablets tonight then resume 1 tablet once daily   Current Anticoagulation Instructions: INR 3.2 Take coumadin 2mg  tonight then resume 4mg  once daily

## 2010-10-12 NOTE — Progress Notes (Signed)
Summary: med  Phone Note Call from Patient   Summary of Call: pt would like to get a stronger pain pill. the other just isn't working anymore.  joann [phone] Initial call taken by: Rudene Anda,  October 04, 2010 1:41 PM  Follow-up for Phone Call        needs ov scheduled before any changes in pain management pls let him know, no appt is even in the system for him actually Follow-up by: Syliva Overman MD,  October 04, 2010 5:52 PM  Additional Follow-up for Phone Call Additional follow up Details #1::        called facility no answer Additional Follow-up by: Everitt Amber LPN,  October 05, 2010 12:53 PM    Additional Follow-up for Phone Call Additional follow up Details #2::    facility aware Follow-up by: Everitt Amber LPN,  October 05, 2010 2:07 PM

## 2010-10-17 NOTE — Letter (Signed)
[facility]: [facility]   Imported By: Lind Guest 10/11/2010 16:30:02  _____________________________________________________________________  External Attachment:    Type:   Image     Comment:   External Document

## 2010-10-23 LAB — BASIC METABOLIC PANEL
BUN: 16 mg/dL (ref 6–23)
CO2: 22 mEq/L (ref 19–32)
CO2: 24 mEq/L (ref 19–32)
CO2: 26 mEq/L (ref 19–32)
Calcium: 9.1 mg/dL (ref 8.4–10.5)
Chloride: 109 mEq/L (ref 96–112)
Chloride: 113 mEq/L — ABNORMAL HIGH (ref 96–112)
Creatinine, Ser: 1.2 mg/dL (ref 0.4–1.5)
GFR calc Af Amer: >60 mL/min (ref >60)
Glucose, Bld: 100 mg/dL — ABNORMAL HIGH (ref 70–99)
Glucose, Bld: 85 mg/dL (ref 70–99)
Potassium: 3.7 mEq/L (ref 3.5–5.1)
Potassium: 4 mEq/L (ref 3.5–5.1)
Potassium: 4 mEq/L (ref 3.5–5.1)
Sodium: 137 mEq/L (ref 135–145)
Sodium: 141 mEq/L (ref 135–145)

## 2010-10-23 LAB — GLUCOSE, CAPILLARY
Glucose-Capillary: 104 mg/dL — ABNORMAL HIGH (ref 70–99)
Glucose-Capillary: 115 mg/dL — ABNORMAL HIGH (ref 70–99)
Glucose-Capillary: 116 mg/dL — ABNORMAL HIGH (ref 70–99)
Glucose-Capillary: 127 mg/dL — ABNORMAL HIGH (ref 70–99)
Glucose-Capillary: 132 mg/dL — ABNORMAL HIGH (ref 70–99)
Glucose-Capillary: 138 mg/dL — ABNORMAL HIGH (ref 70–99)
Glucose-Capillary: 138 mg/dL — ABNORMAL HIGH (ref 70–99)
Glucose-Capillary: 96 mg/dL (ref 70–99)
Glucose-Capillary: 97 mg/dL (ref 70–99)

## 2010-10-23 LAB — PROTIME-INR
INR: 2.74 — ABNORMAL HIGH (ref 0.00–1.49)
INR: 2.97 — ABNORMAL HIGH (ref 0.00–1.49)
Prothrombin Time: 28.8 seconds — ABNORMAL HIGH (ref 11.6–15.2)

## 2010-10-23 LAB — CBC
HCT: 43.9 % (ref 39.0–52.0)
MCHC: 34.7 g/dL (ref 30.0–36.0)
MCV: 96 fL (ref 78.0–100.0)
RBC: 4.57 MIL/uL (ref 4.22–5.81)
WBC: 6.3 10*3/uL (ref 4.0–10.5)

## 2010-10-24 LAB — LIPID PANEL
Total CHOL/HDL Ratio: 4.8 RATIO
Triglycerides: 127 mg/dL (ref <150)
VLDL: 25 mg/dL (ref 0–40)

## 2010-10-24 LAB — DIFFERENTIAL
Basophils Relative: 0 % (ref 0–1)
Eosinophils Absolute: 0.1 10*3/uL (ref 0.0–0.7)
Eosinophils Relative: 3 % (ref 0–5)
Lymphocytes Relative: 18 % (ref 12–46)
Lymphs Abs: 1.5 10*3/uL (ref 0.7–4.0)
Lymphs Abs: 2 10*3/uL (ref 0.7–4.0)
Monocytes Absolute: 0.8 10*3/uL (ref 0.1–1.0)
Monocytes Relative: 13 % — ABNORMAL HIGH (ref 3–12)
Neutrophils Relative %: 70 % (ref 43–77)

## 2010-10-24 LAB — PROTIME-INR
INR: 1.87 — ABNORMAL HIGH (ref 0.00–1.49)
Prothrombin Time: 21.2 seconds — ABNORMAL HIGH (ref 11.6–15.2)
Prothrombin Time: 21.4 seconds — ABNORMAL HIGH (ref 11.6–15.2)

## 2010-10-24 LAB — COMPREHENSIVE METABOLIC PANEL
ALT: 10 U/L (ref 0–53)
AST: 16 U/L (ref 0–37)
Albumin: 3.7 g/dL (ref 3.5–5.2)
Alkaline Phosphatase: 66 U/L (ref 39–117)
CO2: 24 mEq/L (ref 19–32)
Chloride: 106 mEq/L (ref 96–112)
GFR calc Af Amer: >60 mL/min (ref >60)
Potassium: 4.1 mEq/L (ref 3.5–5.1)
Sodium: 137 mEq/L (ref 135–145)
Total Bilirubin: 0.8 mg/dL (ref 0.3–1.2)

## 2010-10-24 LAB — HEMOGLOBIN A1C: Hgb A1c MFr Bld: 6.2 % — ABNORMAL HIGH (ref <5.7)

## 2010-10-24 LAB — TROPONIN I: Troponin I: 0.03 ng/mL (ref 0.00–0.06)

## 2010-10-24 LAB — CBC
HCT: 45.8 % (ref 39.0–52.0)
Hemoglobin: 16 g/dL (ref 13.0–17.0)
Hemoglobin: 16.1 g/dL (ref 13.0–17.0)
MCHC: 35.2 g/dL (ref 30.0–36.0)
MCV: 94.8 fL (ref 78.0–100.0)
MCV: 95.2 fL (ref 78.0–100.0)
RBC: 4.79 MIL/uL (ref 4.22–5.81)
WBC: 7.9 10*3/uL (ref 4.0–10.5)

## 2010-10-24 LAB — GLUCOSE, CAPILLARY: Glucose-Capillary: 84 mg/dL (ref 70–99)

## 2010-10-24 LAB — BASIC METABOLIC PANEL
CO2: 24 mEq/L (ref 19–32)
Chloride: 106 mEq/L (ref 96–112)
GFR calc Af Amer: >60 mL/min (ref >60)
Potassium: 4 mEq/L (ref 3.5–5.1)
Sodium: 137 mEq/L (ref 135–145)

## 2010-10-24 LAB — APTT: aPTT: 44 seconds — ABNORMAL HIGH (ref 24–37)

## 2010-10-24 LAB — CK TOTAL AND CKMB (NOT AT ARMC): CK, MB: 2.1 ng/mL (ref 0.3–4.0)

## 2010-10-24 NOTE — Letter (Signed)
Summary: medical clearance  medical clearance   Imported By: Lind Guest 10/18/2010 16:03:01  _____________________________________________________________________  External Attachment:    Type:   Image     Comment:   External Document

## 2010-10-25 ENCOUNTER — Other Ambulatory Visit: Payer: Self-pay | Admitting: Family Medicine

## 2010-10-27 ENCOUNTER — Encounter: Payer: Self-pay | Admitting: Cardiology

## 2010-10-27 DIAGNOSIS — I2699 Other pulmonary embolism without acute cor pulmonale: Secondary | ICD-10-CM

## 2010-10-27 DIAGNOSIS — Z7901 Long term (current) use of anticoagulants: Secondary | ICD-10-CM

## 2010-10-27 DIAGNOSIS — I679 Cerebrovascular disease, unspecified: Secondary | ICD-10-CM

## 2010-11-01 ENCOUNTER — Encounter: Payer: Medicare Other | Admitting: *Deleted

## 2010-11-01 ENCOUNTER — Encounter: Payer: Self-pay | Admitting: *Deleted

## 2010-11-08 ENCOUNTER — Other Ambulatory Visit: Payer: Self-pay | Admitting: Family Medicine

## 2010-11-15 ENCOUNTER — Other Ambulatory Visit: Payer: Self-pay | Admitting: *Deleted

## 2010-11-15 ENCOUNTER — Ambulatory Visit (INDEPENDENT_AMBULATORY_CARE_PROVIDER_SITE_OTHER): Payer: Medicare Other | Admitting: *Deleted

## 2010-11-15 DIAGNOSIS — I679 Cerebrovascular disease, unspecified: Secondary | ICD-10-CM

## 2010-11-15 DIAGNOSIS — I2699 Other pulmonary embolism without acute cor pulmonale: Secondary | ICD-10-CM

## 2010-11-15 DIAGNOSIS — Z7901 Long term (current) use of anticoagulants: Secondary | ICD-10-CM

## 2010-11-15 LAB — POCT INR: INR: 2.8

## 2010-11-16 LAB — URINALYSIS, ROUTINE W REFLEX MICROSCOPIC
Glucose, UA: NEGATIVE mg/dL
Ketones, ur: NEGATIVE mg/dL
Leukocytes, UA: NEGATIVE
Nitrite: POSITIVE — AB
Specific Gravity, Urine: 1.025 (ref 1.005–1.030)
pH: 5.5 (ref 5.0–8.0)

## 2010-11-16 LAB — COMPREHENSIVE METABOLIC PANEL
ALT: 12 U/L (ref 0–53)
ALT: 13 U/L (ref 0–53)
AST: 15 U/L (ref 0–37)
AST: 19 U/L (ref 0–37)
Alkaline Phosphatase: 58 U/L (ref 39–117)
Alkaline Phosphatase: 64 U/L (ref 39–117)
CO2: 24 mEq/L (ref 19–32)
CO2: 24 mEq/L (ref 19–32)
Calcium: 8.5 mg/dL (ref 8.4–10.5)
Chloride: 107 mEq/L (ref 96–112)
GFR calc Af Amer: >60 mL/min (ref >60)
GFR calc Af Amer: >60 mL/min (ref >60)
GFR calc non Af Amer: 51 mL/min — ABNORMAL LOW (ref >60)
GFR calc non Af Amer: 54 mL/min — ABNORMAL LOW (ref >60)
Glucose, Bld: 100 mg/dL — ABNORMAL HIGH (ref 70–99)
Glucose, Bld: 109 mg/dL — ABNORMAL HIGH (ref 70–99)
Potassium: 4.1 mEq/L (ref 3.5–5.1)
Sodium: 132 mEq/L — ABNORMAL LOW (ref 135–145)
Sodium: 135 mEq/L (ref 135–145)
Total Bilirubin: 0.4 mg/dL (ref 0.3–1.2)

## 2010-11-16 LAB — CBC
HCT: 43.5 % (ref 39.0–52.0)
HCT: 44 % (ref 39.0–52.0)
Hemoglobin: 15.2 g/dL (ref 13.0–17.0)
Hemoglobin: 15.5 g/dL (ref 13.0–17.0)
Hemoglobin: 15.6 g/dL (ref 13.0–17.0)
MCHC: 34.6 g/dL (ref 30.0–36.0)
MCHC: 35.1 g/dL (ref 30.0–36.0)
MCHC: 35.2 g/dL (ref 30.0–36.0)
MCHC: 35.7 g/dL (ref 30.0–36.0)
MCV: 93.3 fL (ref 78.0–100.0)
MCV: 93.8 fL (ref 78.0–100.0)
Platelets: 108 10*3/uL — ABNORMAL LOW (ref 150–400)
RBC: 4.6 MIL/uL (ref 4.22–5.81)
RBC: 4.74 MIL/uL (ref 4.22–5.81)
RBC: 4.91 MIL/uL (ref 4.22–5.81)
RDW: 13.7 % (ref 11.5–15.5)
RDW: 13.8 % (ref 11.5–15.5)
RDW: 14.3 % (ref 11.5–15.5)
WBC: 6.4 10*3/uL (ref 4.0–10.5)
WBC: 6.5 10*3/uL (ref 4.0–10.5)

## 2010-11-16 LAB — URINE CULTURE

## 2010-11-16 LAB — BASIC METABOLIC PANEL
BUN: 18 mg/dL (ref 6–23)
CO2: 24 mEq/L (ref 19–32)
CO2: 24 mEq/L (ref 19–32)
Calcium: 8.3 mg/dL — ABNORMAL LOW (ref 8.4–10.5)
Chloride: 101 mEq/L (ref 96–112)
Chloride: 101 mEq/L (ref 96–112)
Creatinine, Ser: 1.23 mg/dL (ref 0.4–1.5)
GFR calc Af Amer: 52 mL/min — ABNORMAL LOW (ref >60)
GFR calc Af Amer: >60 mL/min (ref >60)
GFR calc non Af Amer: 43 mL/min — ABNORMAL LOW (ref >60)
GFR calc non Af Amer: 51 mL/min — ABNORMAL LOW (ref >60)
Glucose, Bld: 92 mg/dL (ref 70–99)
Glucose, Bld: 94 mg/dL (ref 70–99)
Potassium: 4.1 mEq/L (ref 3.5–5.1)
Potassium: 4.2 mEq/L (ref 3.5–5.1)
Sodium: 128 mEq/L — ABNORMAL LOW (ref 135–145)
Sodium: 132 mEq/L — ABNORMAL LOW (ref 135–145)

## 2010-11-16 LAB — RAPID URINE DRUG SCREEN, HOSP PERFORMED: Tetrahydrocannabinol: NOT DETECTED

## 2010-11-16 LAB — URINE MICROSCOPIC-ADD ON

## 2010-11-16 LAB — DIFFERENTIAL
Basophils Absolute: 0 10*3/uL (ref 0.0–0.1)
Basophils Relative: 0 % (ref 0–1)
Eosinophils Absolute: 0 10*3/uL (ref 0.0–0.7)
Eosinophils Relative: 0 % (ref 0–5)

## 2010-11-16 LAB — POCT I-STAT, CHEM 8
BUN: 22 mg/dL (ref 6–23)
Calcium, Ion: 1.13 mmol/L (ref 1.12–1.32)
Chloride: 111 mEq/L (ref 96–112)
HCT: 53 % — ABNORMAL HIGH (ref 39.0–52.0)
Potassium: 4.2 mEq/L (ref 3.5–5.1)

## 2010-11-16 LAB — ABO/RH: ABO/RH(D): O POS

## 2010-11-16 LAB — LIPASE, BLOOD: Lipase: 27 U/L (ref 11–59)

## 2010-11-16 LAB — TYPE AND SCREEN: ABO/RH(D): O POS

## 2010-11-16 LAB — PROTIME-INR
Prothrombin Time: 23.9 seconds — ABNORMAL HIGH (ref 11.6–15.2)
Prothrombin Time: 28.7 seconds — ABNORMAL HIGH (ref 11.6–15.2)

## 2010-11-22 ENCOUNTER — Telehealth: Payer: Self-pay | Admitting: Family Medicine

## 2010-11-23 NOTE — Telephone Encounter (Signed)
pls order adult diapers with pt's weight so they can determine siza for 1 monthrefill 11, dx incontinence

## 2010-11-23 NOTE — Telephone Encounter (Signed)
Sending paperwork for signature

## 2010-11-23 NOTE — Telephone Encounter (Signed)
Ordered on letterhead and faxed to [facility]

## 2010-11-28 ENCOUNTER — Telehealth (INDEPENDENT_AMBULATORY_CARE_PROVIDER_SITE_OTHER): Payer: Medicare Other | Admitting: Family Medicine

## 2010-11-28 DIAGNOSIS — K5900 Constipation, unspecified: Secondary | ICD-10-CM

## 2010-11-28 NOTE — Telephone Encounter (Signed)
Colace 100mg  two twice daily #120 refill 3 and dulcolax tab 10mg  one every 3 days if no bowel movement along with fleets enema 1 every 3 days if no BM, for the dulcolax and fleets prescribe 8 of each per month with 3 refills pls

## 2010-11-28 NOTE — Telephone Encounter (Signed)
Last went 4 days ago. Has been taking milk of magnesia but not helping. Wants something else called into make him have BM. Goes approx every 5 days. No complaints of pain but uncomfortable. [facility]

## 2010-11-28 NOTE — Telephone Encounter (Signed)
Advise magnesium citrate which is oTC and stool softeners 2 twice daily , colace, may also use an enema which are all otc

## 2010-11-28 NOTE — Telephone Encounter (Signed)
They can't get OTC products and medicare pay for them. Can I send a prescription? If so, directions/quanity please

## 2010-11-29 ENCOUNTER — Ambulatory Visit (INDEPENDENT_AMBULATORY_CARE_PROVIDER_SITE_OTHER): Payer: Medicare Other | Admitting: *Deleted

## 2010-11-29 DIAGNOSIS — Z7901 Long term (current) use of anticoagulants: Secondary | ICD-10-CM

## 2010-11-29 DIAGNOSIS — I2699 Other pulmonary embolism without acute cor pulmonale: Secondary | ICD-10-CM

## 2010-11-29 DIAGNOSIS — I679 Cerebrovascular disease, unspecified: Secondary | ICD-10-CM

## 2010-11-29 LAB — POCT INR: INR: 2

## 2010-11-29 MED ORDER — DOCUSATE SODIUM 100 MG PO CAPS: ORAL_CAPSULE | ORAL | Status: DC

## 2010-11-29 MED ORDER — DISPOSABLE ENEMA 19-7 GM/118ML RE ENEM: ENEMA | RECTAL | Status: AC

## 2010-11-29 NOTE — Telephone Encounter (Signed)
[facility]

## 2010-12-01 ENCOUNTER — Other Ambulatory Visit: Payer: Self-pay | Admitting: Family Medicine

## 2010-12-06 DIAGNOSIS — I10 Essential (primary) hypertension: Secondary | ICD-10-CM

## 2010-12-06 DIAGNOSIS — R262 Difficulty in walking, not elsewhere classified: Secondary | ICD-10-CM

## 2010-12-06 DIAGNOSIS — F0391 Unspecified dementia with behavioral disturbance: Secondary | ICD-10-CM

## 2010-12-06 DIAGNOSIS — I69919 Unspecified symptoms and signs involving cognitive functions following unspecified cerebrovascular disease: Secondary | ICD-10-CM
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
## 2010-12-22 ENCOUNTER — Other Ambulatory Visit: Payer: Self-pay | Admitting: Family Medicine
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
## 2010-12-22 NOTE — H&P (Signed)
NAME:  [name] [name], SAUBER NO.:  [id number]   MEDICAL RECORD NO.:  [id number]                   PATIENT TYPE:  IPS   LOCATION:  0406                                 FACILITY:  [facility]   PHYSICIAN:  Geoffery Lyons, M.D.                   DATE OF BIRTH:  Mar 11, 1941   DATE OF ADMISSION:  10/07/2003  DATE OF DISCHARGE:                         PSYCHIATRIC ADMISSION ASSESSMENT   IDENTIFYING INFORMATION:  A 70 year old divorced African-American male,  voluntarily admitted on October 07, 2003.   HISTORY OF PRESENT ILLNESS:  The patient presents with a history of  depression.  The patient states he tried to commit suicide states because  he feels like everything is at the bottom.  He states he has no money for  rent, no money for groceries, and he intentionally overdosed on 12 Coumadin  tablets, Vicodin and Xanax.  The patient was hoping to go to sleep and not  wake up.  The patient was found by his neighbor.  He states if he had not  used his money for cocaine he would have had enough money for things that he  needed.  He denies any current suicidal ideation, homicidal ideation.  Appetite has been satisfactory.  He denies any psychotic symptoms.   PAST PSYCHIATRIC HISTORY:  First admission to [facility], no  other psychiatric admissions.   SOCIAL HISTORY:  70 year old divorced African-American male, 3 children, all  adult.  He lives alone.  He is on disability   FAMILY HISTORY:  Denies.   ALCOHOL DRUG HISTORY:  The patient smokes.  He has a past history of alcohol  abuse.  He has been sober since 51.  Has been using crack.  He started at  the age of 44.   PAST MEDICAL HISTORY:  Primary care [name] [name] is Dr. Loleta Chance in [place].  Medical problems are hypercholesterolemia, stroke 4 years ago with left  sided weakness, hypertension.  Also had a PE where he was hospitalized at  [facility] in November of 2004.   MEDICATIONS:  The patient has been on Norvasc  10 mg p.o. daily, Xanax 0.25  mg p.o. b.i.d., Vicodin 5/500 1 tab every 8 hours p.r.n. for pain, Colace  200 mg p.o. q.h.s., Protonix 40 mg q.a.m., Lipitor 20 mg p.o. q.h.s., Coreg  6.25 mg 1 p.o. b.i.d., enteric coated aspirin 81 mg p.o. daily, Plavix 75 mg  p.o. daily, Coumadin - the patient has been on Coumadin, dosage unknown at  this time.   DRUG ALLERGIES:  No known allergies.   PHYSICAL EXAMINATION:  The patient was assessed at [facility].  The patient  was charcoaled.  His ER record was reviewed.  The patient has an abrasion to  his left knee, also an abrasion to his left knuckles where he states that he  fell.  He denies any head injury.  His vital signs are stable, 97.6, 62  heart rate, 18 respirations, blood pressure is 109/66.  He is 5 feet 9  inches tall.  There is no sign of bleeding.  He is eating at this time and  appears in no acute distress.  Abnormal EKG, atrial flutter.  Albumin is  3.4.  Urine drug screen is positive for opiates, positive benzodiazepines,  positive for cocaine.  CBC is within normal limits.  PT was 20.1, INR is  2.3.  Urinalysis negative.   MENTAL STATUS EXAM:  He is alert, in bed, eating corn on the cob, appears in  no acute distress.  Fair eye contact.  He has a hospital gown on.  Speech  very concrete.  Mood is depressed, affect is flat, thought processes are  coherent, answering appropriately to questions.  He does not appear  distracted.  Cognition function intact.  Appears limited, possibly related  to his stroke, providing mainly yes and no answers.  Judgment is poor,  insight is poor.   ADMISSION DIAGNOSES:   AXIS I:  1. Major depression.  2. Cocaine abuse.   AXIS II:  Deferred.   AXIS III:  Hypertension, elevated cholesterol, status post cardiovascular  accident, and deep vein thrombosis.   AXIS IV:  Economic problems, other psychosocial problems, medical problems.   AXIS V:  Current is 30, past year 47.   PLAN:  Admission for  intentional overdose.  Contract for safety, check every  15 minutes.  Will have pharmacy monitor Coumadin dosages and monitor other  labs as needed.  The patient is considered to be a high fall risk.  We will  assist with ADLs. Will have internal medicine monitor the patient's multiple  medical problems.  Consider placement for the patient is the patient is  agreeable.  Will also initiate an antidepressant and look at relapse  prevention.   TENTATIVE LENGTH OF CARE:  5-6 days or more depending on the patient's  response to medications and placement may be indicated.     Landry Corporal, N.P.                       Geoffery Lyons, M.D.    JO/MEDQ  D:  10/11/2003  T:  10/11/2003  Job:  [id number]

## 2010-12-22 NOTE — Procedures (Signed)
   NAME:  [name] [name], [name] [name]                          ACCOUNT NO.:  [id number]   MEDICAL RECORD NO.:  [id number]                   PATIENT TYPE:  OBV   LOCATION:  A224                                 FACILITY:  [facility]   PHYSICIAN:  Edward L. Juanetta Gosling, M.D.             DATE OF BIRTH:  06/28/1941   DATE OF PROCEDURE:  05/21/2002  DATE OF DISCHARGE:  05/23/2002                                EKG INTERPRETATION   The rhythm is a sinus bradycardia.  The rate is about 50.  There are  inferior T-wave abnormalities also seen laterally and there are Q waves  inferiorly, suggestive of previous inferior infarction.  Clinical  correlation is suggested.  Abnormal electrocardiogram.                                               Oneal Deputy. Juanetta Gosling, M.D.    ELH/MEDQ  D:  05/25/2002  T:  05/25/2002  Job:  [id number]

## 2010-12-22 NOTE — Op Note (Signed)
[place]  Patient:    [name] [name], [name] [name] Visit Number: [id number] MRN: [id number]          Service Type: DSU Location: [facility] Attending Physician:  Corinda Gubler Dictated by:   Aura Camps, M.D. Proc. Date: 05/29/01 Admit Date:  05/29/2001                             Operative Report  PREOPERATIVE DIAGNOSES: 1. Diplopia. 2. Exotropia.  POSTOPERATIVE DIAGNOSES: 1. Diplopia. 2. Exotropia.  OPERATION: 1. Left medial rectus resection of 6 mm. 2. Right lateral rectus resection of 10 mm on adjustable suture.  SURGEON:  Aura Camps, M.D.  INDICATIONS:  [name] [name] is a 70 year old gentleman with diplopia and exotropic status post intracranial aneurysm excavation.  This procedure is indicated to correct the deviation of the visual axis and restore single and biocular vision.  The risks and benefits of the procedure were explained to the patient.  Prior to proceeding, informed consent was obtained.  DESCRIPTION OF PROCEDURE:  The patient was taken to the operating room and placed in the supine position.  The entire face was prepped and draped in the sterile manner.  Attention was first turned to his left eye. Forced duction tests were found to negative.  The globe was held in the inferior temporal quadrant. The nasal quadrant was elevated and abducted.  Incision was made through the inferior nasal fornix, taken down to the posterior subtenon space.  Next the rectus muscle was isolated on a Stevens hook and subsequently on a green hook.  A second green hook was then passed _____ the muscle, and the muscle was then exposed and carefully dissected from its overlying muscle fascia and into muscle septum and exposed for a distance of 8 mm.  A mark was then placed on the ____________ 6 mm from its insertion.  The muscle was then imbricated using 6-0 Vicryl suture and taking two locking bites at the ends at a point proximal to the preplaced  mark.  The muscle was then transected to the proximal preplaced sutures and the resected muscle stump removed from the globe.  The resected muscle was then advanced on the globe at the insertion site near the preplaced sutures.  The sutures were tied securely, and the conjunctivae was repositioned.  There were no complications.  Attention was then turned to the left eye.  The globe was then held in the inferior temporal quadrant and was elevated adducted.  Incision was made through the inferior temporal fornix, taken down to the posterior subtenons space. The left lateral rectus muscle was then isolated on a Stevens hook and a subsequently a green hook.  Muscle was carefully imbricated using a 6-0 Vicryl suture taking two locking bites at the ends.  It was then detached from the globe and recessed exactly 10 mm, placed on an adjustable suture.  The muscle was reattached to the globe using preplaced sutures and tied in an adjustable fashion.  The conjunctiva was repositioned.  Double pressure patch was applied.  There were no complications.  The patient was then returned to the recovery room, awakened in stable condition and subsequently adjusted to __________ in the recovery room. Dictated by:   Aura Camps, M.D. Attending Physician:  Corinda Gubler DD:  05/29/01 TD:  05/30/01 Job: [id number]

## 2010-12-22 NOTE — Discharge Summary (Signed)
NAME:  [name] [name], [name] [name]                          ACCOUNT NO.:  [id number]   MEDICAL RECORD NO.:  [id number]                   PATIENT TYPE:  INP   LOCATION:  A330                                 FACILITY:  [facility]   PHYSICIAN:  Hanley Hays. Dechurch, M.D.           DATE OF BIRTH:  08-08-1940   DATE OF ADMISSION:  11/08/2003  DATE OF DISCHARGE:  11/10/2003                                 DISCHARGE SUMMARY   DIAGNOSES:  1. Epididymitis/orchitis with hydrocele.  2. 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  3. History of cerebrovascular accident with residual left hemiparesis.  4. Removal history of cerebral aneurysm.  5. History of polysubstance abuse, currently abstinent.   DISPOSITION:  1. Patient discharged to home.   FOLLOW UP:  1. Follow up with Dr. Loleta Chance as scheduled.  2. The patient instructed to go to Cardiology Clinic on April 8, for follow     up of INR.  INR 2.3 at the time of discharge.  3. To follow up with Dr. Rito Ehrlich in 2 weeks.   MEDICATIONS:  1. Levaquin 750 daily, 3 weeks' supply prescribed with 1 refill.  2. Coumadin 5 mg daily.  3. Lipitor 10 mg at h.s.  4. Xanax 0.5 b.i.d. p.r.n.  5. Colace 100 mg b.i.d.  6. Aspirin 81 mg daily.  7. Celexa 10 mg daily.  8. Protonix 40 mg daily.  9. Vicodin as needed for pain.   SPECIAL INSTRUCTIONS:  1. The patient is to wear a scrotal support.  2. The patient instructed to call if increasing swelling, pain, or fever.   HOSPITAL COURSE:  A 70 year old gentleman who presented to the hospital with  recurrent scrotal pain with swelling and an ultrasound that revealed a  loculated fluid collection with a question of abscess.  The patient had no  systemic symptoms.  His white count was normal. His biggest complaint was  pain.  He apparently had been treated as an outpatient with ciprofloxacin  with improvement, but the heaviness never totally resolved.   Several days  prior to admission he had progressed with swelling which prompted his  evaluation in the emergency room.  The patient was treated with IV Levaquin  and over the course of the first 24 hours he noted much improvement in his  pain and erythema.  There was improvement in the swelling and induration as  well as pain.  The patient continued on improvement. It was felt that it was  unlikely that this was abscess and would not need surgical drainage.   At the time of presentation the patient's PT/INR was 25.8/3.5 respectively.  He had no Coumadin during the 72 hours of admission.  His INR at the time of  discharge is 2.3.  His previous dose of Coumadin was 7.5 daily.  He is being  discharged on 5 daily and will be followed  up in the Coumadin clinic.  They  have been made aware of the fact that the patient will be on Levaquin for an  extended period of time and this should be taken into consideration with his  monitoring. It is felt that the patient had reached his maximum benefit  about inpatient stay and was eager for discharge.   For physical exam at the time of discharge the reader is referred to the  H&P.  There was really no significant changes with the exception of his  scrotum which revealed a mass on the left which was soft.  Tenderness was  much improved.  There was still some erythema but not anywhere to the degree  previously.  He had no lymph nodes noted in his groin an no tenderness.  No  other significant findings.     ___________________________________________                                         Hanley Hays Josefine Class, M.D.   FED/MEDQ  D:  11/10/2003  T:  11/11/2003  Job:  [id number]   cc:   Annia Friendly. Loleta Chance, M.D.  [address]  [place]  [place] [address]  Fax: [phone]   Dennie Maizes, M.D.  Fax: [phone]

## 2010-12-22 NOTE — Procedures (Signed)
[facility]  Patient:    [name] [name], [name] [name] Visit Number: [id number] MRN: [id number]          Service Type: MED Location: 2A A216 01 Attending Physician:  Syliva Overman Dictated by:   Beryle Beams, M.D. Proc. 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  The 16 channel recorder ______ for 20 minutes has a ______ rhythm that gets as high 12 hertz.  Significant amount of recording is seen while asleep. Stage 2 sleep with K complexes and ______ are seen.  Photo stimulation is collected.  There is no significant change with photic stimulation.  The patient is noted to have snoring while sleeping.  There is no focal slowing or epileptiform activity present.  There is a normal recording in awake and sleep states.  There is no focal slowing or epileptiform activity seen. Dictated by:   Beryle Beams, M.D. Attending Physician:  Syliva Overman DD:  11/12/01 TD:  11/13/01 Job: [id number]

## 2010-12-22 NOTE — Discharge Summary (Signed)
NAME:  [name] [name], TOBON NO.:  [id number]   MEDICAL RECORD NO.:  [id number]                   PATIENT TYPE:  IPS   LOCATION:  0406                                 FACILITY:  [facility]   PHYSICIAN:  Geoffery Lyons, M.D.                   DATE OF BIRTH:  Dec 22, 1940   DATE OF ADMISSION:  10/07/2003  DATE OF DISCHARGE:  10/12/2003                                 DISCHARGE SUMMARY   CHIEF COMPLAINT AND PRESENT ILLNESS:  This was the first admission to [facility] for this 70 year old divorced African-American male  voluntarily admitted.  History of depression.  Stated that he tried to  commit suicide because he felt like everything was at the bottom of the  bottle.  Had no money for rent, no money for groceries, intermittently  overdosed on Coumadin tablets, Vicodin and Xanax.  Was hoping to go to sleep  and not wake up.  Found by his neighbor.  He stated that if he did not use  his money for cocaine, he would have had enough money for things that he  needs.  Denied any current suicidal ideation, homicidal ideation.   PAST PSYCHIATRIC HISTORY:  First time at [facility].  No other  psychiatric treatment.   ALCOHOL/DRUG HISTORY:  Smokes.  History of alcohol abuse.  Sober since 1987.  Been using crack.  Started at age 25.   PAST MEDICAL HISTORY:  Hypercholesterolemia, stroke three years prior to  this admission with left-sided weakness, hypertension.   MEDICATIONS:  Norvasc 10 mg daily, Xanax 0.25 mg twice a day, Vicodin  5\500 mg 1 every eight hours as needed for pain, Colace 200 mg at night,  Protonix 40 mg in the morning, Lipitor 20 mg at night, Coreg 6.25 mg, 1  twice a day, enteric-coated aspirin 81 mg daily, Plavix 75 mg daily,  Coumadin.   PHYSICAL EXAMINATION:  Performed and failed to show any acute findings.   LABORATORY DATA:  __________, albumin 3.4.  Urine drug screen positive for  opiates, benzodiazepines and cocaine.  CBC  within normal limits.   MENTAL STATUS EXAM:  Alert male in bed.  Appears to be in no acute distress.  Little eye contact __________  .  Speech was very concrete.  Mood was  depressed.  Affect was flat.  Thought processes were coherent, answering  appropriately to questions.  Does not appear to be distracted.  Cognition  well-preserved.   ADMISSION DIAGNOSES:   AXIS I:  1. Major depression.  2. Cocaine abuse.   AXIS II:  No diagnosis.   AXIS III:  1. Hypertension.  2. Hypercholesterolemia.  3. Status post cerebrovascular accident.  4. Deep venous thrombosis.   AXIS IV:  Moderate.   AXIS V:  Global Assessment of Functioning upon admission 30; highest Global  Assessment of Functioning in the last year 65.   HOSPITAL  COURSE:  He was admitted and started intensive individual and group  psychotherapy.  He was given Ativan as needed for anxiety.  He was  maintained on the Coumadin, Celexa 10 mg daily, Xanax 0.25 mg twice a day as  needed, Norvasc 10 mg daily, Vicodin 5\500 mg, Protonix 40 mg daily,  Lipitor 20 mg daily, Colace 200 mg at night, Coreg 6.25 mg twice a day.  He  was seen by internal medicine service.  They recommend to discontinue the  Norvasc and to discontinue the Plavix.  He did endorse in session that he  felt very overwhelmed with the issues having to do with financial  difficulties, not being able to pay the bills, cannot make all the payments  with his disability money, very overwhelmed with it.  Although he claimed  the family to be supportive, he felt they did not understand the severity of  the situation that he was in.  His mood was depressed.  Affect was  depressed.  He was not spontaneous.  He was reserved, guarded.  Suicidal  ruminations.  As the hospitalization progressed, he seemed to be feeling  better.  He was told that there was a place he could go to stay with one of  his relatives' places.  He would like to go there.  They felt he could go  there,  was very encouraging, was positive about it.  He started feeling  better.  On October 12, 2003, he was ready for discharge.  He was in full  contact with reality.  No suicidal ideation.  No homicidal ideation.  No  delusions.  Willing to pursue further outpatient treatment upon discharge.  Feeling much better.   DISCHARGE DIAGNOSES:   AXIS I:  1. Major depression.  2. Cocaine abuse.   AXIS II:  No diagnosis.   AXIS III:  No diagnosis.   AXIS IV:  Moderate.   AXIS V:  Global Assessment of Functioning upon discharge 55.   DISCHARGE MEDICATIONS:  1. Lovenox 100 mg subcutaneous every 12 hours.  2. Coumadin 5 mg at 6:30 on October 13, 2003.  3. Xanax 0.25 mg twice a day.  4. Colace 100 mg, 2 twice a day.  5. Protonix 40 mg daily.  6. Lipitor 100 mg, 2 at bedtime.  7. Coreg 3.125 mg, 2 twice a day.  8. Aspirin 81 mg daily.  9. __________ 1/2 daily.  10.      Celexa 10 mg daily.  11.      Vicodin 5\500 mg every eight hours as needed for pain.   FOLLOW UP:  [facility].                                               Geoffery Lyons, M.D.    IL/MEDQ  D:  11/03/2003  T:  11/04/2003  Job:  [id number]

## 2010-12-27 ENCOUNTER — Ambulatory Visit (INDEPENDENT_AMBULATORY_CARE_PROVIDER_SITE_OTHER): Payer: Medicare Other | Admitting: *Deleted

## 2010-12-27 DIAGNOSIS — Z7901 Long term (current) use of anticoagulants: Secondary | ICD-10-CM

## 2010-12-27 DIAGNOSIS — I2699 Other pulmonary embolism without acute cor pulmonale: Secondary | ICD-10-CM

## 2010-12-27 DIAGNOSIS — I679 Cerebrovascular disease, unspecified: Secondary | ICD-10-CM

## 2010-12-28 ENCOUNTER — Other Ambulatory Visit: Payer: Self-pay | Admitting: Family Medicine

## 2011-01-03 ENCOUNTER — Encounter: Payer: Self-pay | Admitting: Family Medicine

## 2011-01-05 ENCOUNTER — Ambulatory Visit: Payer: Medicare Other | Admitting: Family Medicine

## 2011-01-10 ENCOUNTER — Other Ambulatory Visit: Payer: Self-pay | Admitting: Family Medicine

## 2011-01-10 ENCOUNTER — Telehealth: Payer: Self-pay | Admitting: Family Medicine

## 2011-01-10 NOTE — Telephone Encounter (Signed)
This pt has not been seen for over 6 months, he needs an ov, pls make the facility aware of this, I will order the study

## 2011-01-10 NOTE — Telephone Encounter (Signed)
Facility aware order sent for study and advised to make appt

## 2011-01-10 NOTE — Telephone Encounter (Signed)
What med do they need, pls refill 1 month and he nEEDS an OV let them know pls

## 2011-01-11 ENCOUNTER — Telehealth: Payer: Self-pay | Admitting: Family Medicine

## 2011-01-11 NOTE — Telephone Encounter (Signed)
pls try and set up appt as requested if possible by next week, and notify pt and therapist of appt info

## 2011-01-16 ENCOUNTER — Telehealth: Payer: Self-pay | Admitting: Family Medicine

## 2011-01-16 DIAGNOSIS — R4702 Dysphasia: Secondary | ICD-10-CM

## 2011-01-16 NOTE — Telephone Encounter (Signed)
A referral was madtr to [facility], thios can be printed and faxed if they need this

## 2011-01-17 ENCOUNTER — Other Ambulatory Visit: Payer: Self-pay | Admitting: Family Medicine

## 2011-01-17 NOTE — Telephone Encounter (Signed)
This order was faxed as requested

## 2011-01-22 ENCOUNTER — Other Ambulatory Visit: Payer: Self-pay | Admitting: Family Medicine

## 2011-01-22 ENCOUNTER — Ambulatory Visit (HOSPITAL_COMMUNITY): Payer: Medicare Other

## 2011-01-24 ENCOUNTER — Ambulatory Visit (INDEPENDENT_AMBULATORY_CARE_PROVIDER_SITE_OTHER): Payer: Medicare Other | Admitting: *Deleted

## 2011-01-24 ENCOUNTER — Ambulatory Visit (HOSPITAL_COMMUNITY)
Admission: RE | Admit: 2011-01-24 | Discharge: 2011-01-24 | Disposition: A | Discharge status: Home / Self Care | Payer: Medicare Other | Source: Ambulatory Visit | Attending: Family Medicine | Admitting: Family Medicine

## 2011-01-24 DIAGNOSIS — I679 Cerebrovascular disease, unspecified: Secondary | ICD-10-CM

## 2011-01-24 DIAGNOSIS — I2699 Other pulmonary embolism without acute cor pulmonale: Secondary | ICD-10-CM

## 2011-01-24 DIAGNOSIS — Z7901 Long term (current) use of anticoagulants: Secondary | ICD-10-CM

## 2011-01-25 ENCOUNTER — Other Ambulatory Visit: Payer: Self-pay | Admitting: Cardiology

## 2011-01-29 ENCOUNTER — Encounter: Payer: Self-pay | Admitting: Family Medicine

## 2011-01-30 ENCOUNTER — Ambulatory Visit (INDEPENDENT_AMBULATORY_CARE_PROVIDER_SITE_OTHER): Payer: Medicare Other | Admitting: Family Medicine

## 2011-01-30 ENCOUNTER — Encounter: Payer: Self-pay | Admitting: Family Medicine

## 2011-01-30 VITALS — BP 112/74 | HR 55 | Resp 16 | Wt 206.0 lb

## 2011-01-30 DIAGNOSIS — F3289 Other specified depressive episodes: Secondary | ICD-10-CM

## 2011-01-30 DIAGNOSIS — F329 Major depressive disorder, single episode, unspecified: Secondary | ICD-10-CM

## 2011-01-30 DIAGNOSIS — R5383 Other fatigue: Secondary | ICD-10-CM

## 2011-01-30 DIAGNOSIS — E739 Lactose intolerance, unspecified: Secondary | ICD-10-CM

## 2011-01-30 DIAGNOSIS — I2699 Other pulmonary embolism without acute cor pulmonale: Secondary | ICD-10-CM

## 2011-01-30 DIAGNOSIS — I10 Essential (primary) hypertension: Secondary | ICD-10-CM

## 2011-01-30 DIAGNOSIS — Z7901 Long term (current) use of anticoagulants: Secondary | ICD-10-CM

## 2011-01-30 DIAGNOSIS — R7301 Impaired fasting glucose: Secondary | ICD-10-CM

## 2011-01-30 DIAGNOSIS — E538 Deficiency of other specified B group vitamins: Secondary | ICD-10-CM

## 2011-01-30 DIAGNOSIS — F17200 Nicotine dependence, unspecified, uncomplicated: Secondary | ICD-10-CM

## 2011-01-30 DIAGNOSIS — E785 Hyperlipidemia, unspecified: Secondary | ICD-10-CM

## 2011-01-30 NOTE — Patient Instructions (Signed)
F/U in 3.5 months.   No medication changes. Start living more and more,and do RIGHT!  HBA1C, chem 7, cbc an B12 today

## 2011-01-31 ENCOUNTER — Ambulatory Visit (HOSPITAL_COMMUNITY)
Admission: RE | Admit: 2011-01-31 | Discharge: 2011-01-31 | Disposition: A | Discharge status: Home / Self Care | Payer: Medicare Other | Source: Ambulatory Visit | Attending: Family Medicine | Admitting: Family Medicine

## 2011-01-31 ENCOUNTER — Telehealth: Payer: Self-pay | Admitting: Family Medicine

## 2011-01-31 DIAGNOSIS — R1310 Dysphagia, unspecified: Secondary | ICD-10-CM | POA: Insufficient documentation

## 2011-01-31 DIAGNOSIS — K219 Gastro-esophageal reflux disease without esophagitis: Secondary | ICD-10-CM | POA: Insufficient documentation

## 2011-01-31 LAB — CBC WITH DIFFERENTIAL/PLATELET
Basophils Absolute: 0 K/uL (ref 0.0–0.1)
Basophils Relative: 0 % (ref 0–1)
Eosinophils Absolute: 0.3 K/uL (ref 0.0–0.7)
Eosinophils Relative: 4 % (ref 0–5)
HCT: 43.6 % (ref 39.0–52.0)
Hemoglobin: 14.9 g/dL (ref 13.0–17.0)
Lymphocytes Relative: 55 % — ABNORMAL HIGH (ref 12–46)
Lymphs Abs: 3.3 K/uL (ref 0.7–4.0)
MCH: 31.8 pg (ref 26.0–34.0)
MCHC: 34.2 g/dL (ref 30.0–36.0)
MCV: 93 fL (ref 78.0–100.0)
Monocytes Absolute: 0.5 K/uL (ref 0.1–1.0)
Monocytes Relative: 9 % (ref 3–12)
Neutro Abs: 1.9 K/uL (ref 1.7–7.7)
Neutrophils Relative %: 31 % — ABNORMAL LOW (ref 43–77)
Platelets: 104 K/uL — ABNORMAL LOW (ref 150–400)
RBC: 4.69 MIL/uL (ref 4.22–5.81)
RDW: 13.2 % (ref 11.5–15.5)
WBC: 6 K/uL (ref 4.0–10.5)

## 2011-01-31 LAB — BASIC METABOLIC PANEL WITH GFR
BUN: 16 mg/dL (ref 6–23)
CO2: 27 meq/L (ref 19–32)
Calcium: 9 mg/dL (ref 8.4–10.5)
Chloride: 103 meq/L (ref 96–112)
Creat: 1.17 mg/dL (ref 0.50–1.35)
Glucose, Bld: 83 mg/dL (ref 70–99)
Potassium: 4.2 meq/L (ref 3.5–5.3)
Sodium: 138 meq/L (ref 135–145)

## 2011-01-31 LAB — HEMOGLOBIN A1C
Hgb A1c MFr Bld: 6.2 % — ABNORMAL HIGH (ref <5.7)
Mean Plasma Glucose: 131 mg/dL — ABNORMAL HIGH (ref <117)

## 2011-01-31 LAB — VITAMIN B12: Vitamin B-12: 322 pg/mL (ref 211–911)

## 2011-01-31 NOTE — Telephone Encounter (Signed)
Advised to refill thick it

## 2011-02-01 ENCOUNTER — Telehealth: Payer: Self-pay | Admitting: Family Medicine

## 2011-02-01 NOTE — Telephone Encounter (Signed)
pls verify exactly what he needs ,  Thicket??He does need thickened liquids due to swallowing difficulty  or have supplier send refill request e, refill x 6 months

## 2011-02-01 NOTE — Telephone Encounter (Signed)
What are the directions for this? Ok to send?

## 2011-02-01 NOTE — Telephone Encounter (Signed)
Sent in to [facility]

## 2011-02-02 ENCOUNTER — Other Ambulatory Visit: Payer: Self-pay | Admitting: Family Medicine

## 2011-02-05 DIAGNOSIS — I10 Essential (primary) hypertension: Secondary | ICD-10-CM

## 2011-02-05 DIAGNOSIS — I69928 Other speech and language deficits following unspecified cerebrovascular disease: Secondary | ICD-10-CM

## 2011-02-05 DIAGNOSIS — I69919 Unspecified symptoms and signs involving cognitive functions following unspecified cerebrovascular disease: Secondary | ICD-10-CM

## 2011-02-05 DIAGNOSIS — IMO0001 Reserved for inherently not codable concepts without codable children: Secondary | ICD-10-CM

## 2011-02-06 ENCOUNTER — Telehealth: Payer: Self-pay | Admitting: Family Medicine

## 2011-02-08 NOTE — Telephone Encounter (Signed)
SPEECH PATHOLOGIST

## 2011-02-08 NOTE — Telephone Encounter (Signed)
pls print and fax the report of the recent swallow study to her

## 2011-02-08 NOTE — Telephone Encounter (Signed)
whio is [facility], and why dioes she need this.Pls get more info , thanks I

## 2011-02-09 ENCOUNTER — Encounter: Payer: Self-pay | Admitting: Family Medicine

## 2011-02-09 NOTE — Assessment & Plan Note (Signed)
Controlled, no change in medication  

## 2011-02-09 NOTE — Assessment & Plan Note (Signed)
LDL slightly elevated, pt counseled to lower fried and fatty food intake, no changes in medication at this time

## 2011-02-09 NOTE — Assessment & Plan Note (Signed)
Elevated HBa1C, pt counseled to lower carbohydrate intake and work on weight loss to reduce risk of becoming diabetic

## 2011-02-09 NOTE — Assessment & Plan Note (Signed)
Uncontrolled, therapy offered but pt not interested at this time, he has family support in his living environment

## 2011-02-09 NOTE — Telephone Encounter (Signed)
Called [facility], left message, need fax # to send report

## 2011-02-09 NOTE — Progress Notes (Signed)
  Subjective:    Patient ID: [name] [name], male    DOB: 1941/07/06, 70 y.o.   MRN: [id number]  HPI The PT is here for follow up and re-evaluation of chronic medical conditions, medication management and review of recent lab and radiology data.  Preventive health is updated, specifically  Cancer screening,  and Immunization.   The PT denies any adverse reactions to current medications since the last visit.  Increased concern about dysphagia in recent times, has upcoming swallow study There are no specific complaints . Still smokes cigarettes daily, no plan to quit in the near future, denies cocaine or illicit drug use      Review of Systems Denies recent fever or chills. Denies sinus pressure, nasal congestion, ear pain or sore throat. Denies chest congestion, productive cough or wheezing. Denies chest pains, palpitations, paroxysmal nocturnal dyspnea, orthopnea and leg swelling Denies abdominal pain, nausea, vomiting,diarrhea or constipation.  Denies rectal bleeding . Denies dysuria, frequency, hesitancy or incontinence. Chronic back pain and limitation in mobility. Denies headaches, seizure, numbness, or tingling. C/o depression,states he does not really want to live, but has no plans to commit suicide, denies  anxiety or insomnia. Denies skin break down or rash.        Objective:   Physical Exam    Patient alert and oriented and in no Cardiopulmonary distress.  HEENT: No facial asymmetry, EOMI, no sinus tenderness, TM's clear, Oropharynx pink and moist.  Neck decreased ROM, no adenopathy.  Chest: decreased air entry throughou, scattered crackles, no wheezes  CVS: S1, S2 no murmurs, no S3.  ABD: Soft non tender. Bowel sounds normal.  Ext: No edema  MS: decreased ROM spine, shoulders, hips and knees.  Skin: Intact, no ulcerations or rash noted.  Psych: Good eye contact, normal affect. Memory  loss not anxious or depressed appearing.  CNS: CN 2-12 intact,       Assessment & Plan:

## 2011-02-09 NOTE — Assessment & Plan Note (Signed)
Unchanged, counseled to quit , pt not interested at this time

## 2011-02-09 NOTE — Assessment & Plan Note (Signed)
Pt on chronic coumadin, managed at the coumadin clinic

## 2011-02-13 ENCOUNTER — Telehealth: Payer: Self-pay

## 2011-02-13 NOTE — Telephone Encounter (Signed)
She states that he should have had a "Modified" barium swallow test done and not the one that was done. She said the one done was helpful but wasn't as in depth and didn't tell her what she needed to know. She wants to know if it can be done and her number is [phone] and [facility] number is [phone]

## 2011-02-20 ENCOUNTER — Encounter: Payer: Medicare Other | Admitting: *Deleted

## 2011-02-21 ENCOUNTER — Encounter: Payer: Medicare Other | Admitting: *Deleted

## 2011-02-23 ENCOUNTER — Other Ambulatory Visit: Payer: Self-pay | Admitting: Family Medicine

## 2011-02-23 DIAGNOSIS — R933 Abnormal findings on diagnostic imaging of other parts of digestive tract: Secondary | ICD-10-CM

## 2011-02-23 NOTE — Telephone Encounter (Signed)
pls let her know I am referring pt to GI for upper endoscopy/further evaluation based on the rept, pt and facility staff members also need to be told. I will wait on  GI  Eval.  Pls let scheduling know once you have notified pt and facility so she can refer, and provide her with a copy of the barium swallow rept

## 2011-02-23 NOTE — Telephone Encounter (Signed)
CALLED PATIENTS FACILITY AND [name], LEFT MESSAGE FOR BOTH TO RETURN MY CALL

## 2011-02-26 NOTE — Telephone Encounter (Signed)
Facility aware and patient has appt with GI, called speech pathologist and left message.

## 2011-02-27 ENCOUNTER — Telehealth: Payer: Self-pay | Admitting: Family Medicine

## 2011-02-27 ENCOUNTER — Encounter: Payer: Medicare Other | Admitting: *Deleted

## 2011-02-28 ENCOUNTER — Ambulatory Visit (INDEPENDENT_AMBULATORY_CARE_PROVIDER_SITE_OTHER): Payer: Medicare Other | Admitting: *Deleted

## 2011-02-28 DIAGNOSIS — Z7901 Long term (current) use of anticoagulants: Secondary | ICD-10-CM

## 2011-02-28 DIAGNOSIS — I679 Cerebrovascular disease, unspecified: Secondary | ICD-10-CM

## 2011-02-28 DIAGNOSIS — I2699 Other pulmonary embolism without acute cor pulmonale: Secondary | ICD-10-CM

## 2011-03-02 ENCOUNTER — Encounter: Payer: Self-pay | Admitting: Family Medicine

## 2011-03-05 NOTE — Telephone Encounter (Signed)
Faxed as requested

## 2011-03-06 ENCOUNTER — Ambulatory Visit: Payer: Medicare Other | Admitting: Gastroenterology

## 2011-03-07 ENCOUNTER — Ambulatory Visit (INDEPENDENT_AMBULATORY_CARE_PROVIDER_SITE_OTHER): Payer: Medicare Other | Admitting: Family Medicine

## 2011-03-07 ENCOUNTER — Encounter: Payer: Self-pay | Admitting: Family Medicine

## 2011-03-07 VITALS — BP 102/70 | HR 57 | Resp 16

## 2011-03-07 DIAGNOSIS — E785 Hyperlipidemia, unspecified: Secondary | ICD-10-CM

## 2011-03-07 DIAGNOSIS — K219 Gastro-esophageal reflux disease without esophagitis: Secondary | ICD-10-CM

## 2011-03-07 DIAGNOSIS — F411 Generalized anxiety disorder: Secondary | ICD-10-CM

## 2011-03-07 DIAGNOSIS — I10 Essential (primary) hypertension: Secondary | ICD-10-CM

## 2011-03-07 DIAGNOSIS — F17200 Nicotine dependence, unspecified, uncomplicated: Secondary | ICD-10-CM

## 2011-03-07 DIAGNOSIS — R7301 Impaired fasting glucose: Secondary | ICD-10-CM

## 2011-03-07 DIAGNOSIS — E739 Lactose intolerance, unspecified: Secondary | ICD-10-CM

## 2011-03-07 DIAGNOSIS — R1310 Dysphagia, unspecified: Secondary | ICD-10-CM | POA: Insufficient documentation

## 2011-03-07 DIAGNOSIS — E669 Obesity, unspecified: Secondary | ICD-10-CM

## 2011-03-07 MED ORDER — LISINOPRIL 20 MG PO TABS: 20.0000 mg | ORAL_TABLET | Freq: Every day | ORAL | Status: DC

## 2011-03-07 NOTE — Patient Instructions (Addendum)
F/u in  First week in October.  Fasting lipid , hepatic , hBA1C Sept 26 or after, need this several days  BEFORE visit You are referred for modified barium swallow, we will call wih the dat as soon as we have it.  Your blood pressure is low, the prinivil dose is reduced to 20mg  daily, you have been on the 40 mg dose No other med changes

## 2011-03-07 NOTE — Assessment & Plan Note (Signed)
Over corrected, dose reduction in prinivil

## 2011-03-22 ENCOUNTER — Telehealth: Payer: Self-pay | Admitting: Family Medicine

## 2011-03-22 NOTE — Telephone Encounter (Signed)
pls advise her that I have got no info from the therapist, and pls contact home health agency therapist for the specific recommendations

## 2011-03-22 NOTE — Telephone Encounter (Signed)
I do not know anything about this.  

## 2011-03-26 NOTE — Telephone Encounter (Signed)
Facility aware.

## 2011-03-27 NOTE — Assessment & Plan Note (Signed)
Pt to have modified barium swallow study, h/o choking

## 2011-03-27 NOTE — Assessment & Plan Note (Signed)
Improved. Pt applauded on succesful weight loss through lifestyle change, and encouraged to continue same. Weight loss goal set for the next several months.  

## 2011-03-27 NOTE — Assessment & Plan Note (Signed)
Increase in HBA1C  Counseled re lifestyle change needed to reduce of becoming diabetic once more

## 2011-03-27 NOTE — Assessment & Plan Note (Signed)
Controlled, no change in medication  

## 2011-03-27 NOTE — Assessment & Plan Note (Signed)
Updated lab data needed. Low fat diet discussed and encouraged

## 2011-03-27 NOTE — Assessment & Plan Note (Signed)
Unchanged, refuses to quit

## 2011-03-27 NOTE — Progress Notes (Signed)
  Subjective:    Patient ID: [name] [name], male    DOB: 1941-02-23, 70 y.o.   MRN: [id number]  HPI The PT is here for follow up and re-evaluation of chronic medical conditions, medication management and review of any available recent lab and radiology data.  Preventive health is updated, specifically  Cancer screening and Immunization.   Questions or concerns regarding consultations or procedures which the PT has had in the interim are  addressed. The PT denies any adverse reactions to current medications since the last visit.  There are no new concerns.  There are no specific complaints       Review of Systems Denies recent fever or chills. Denies sinus pressure, nasal congestion, ear pain or sore throat. Denies chest congestion, productive cough or wheezing. Denies chest pains, palpitations and leg swelling Denies abdominal pain, nausea, vomiting,diarrhea or constipation.   Denies dysuria, frequency, hesitancy or incontinence. Chronic back, hip , shoulder  and knee pain with limitation in mobilityy. Denies headaches, seizures, numbness, or tingling. Denies depression, anxiety or insomnia. Denies skin break down or rash.        Objective:   Physical Exam        Assessment & Plan:

## 2011-03-28 ENCOUNTER — Ambulatory Visit (INDEPENDENT_AMBULATORY_CARE_PROVIDER_SITE_OTHER): Payer: Medicare Other | Admitting: *Deleted

## 2011-03-28 DIAGNOSIS — I679 Cerebrovascular disease, unspecified: Secondary | ICD-10-CM

## 2011-03-28 DIAGNOSIS — I2699 Other pulmonary embolism without acute cor pulmonale: Secondary | ICD-10-CM

## 2011-03-28 DIAGNOSIS — Z7901 Long term (current) use of anticoagulants: Secondary | ICD-10-CM

## 2011-04-16 ENCOUNTER — Encounter: Payer: Self-pay | Admitting: Family Medicine

## 2011-04-17 ENCOUNTER — Ambulatory Visit (INDEPENDENT_AMBULATORY_CARE_PROVIDER_SITE_OTHER): Payer: Medicare Other | Admitting: Family Medicine

## 2011-04-17 ENCOUNTER — Encounter: Payer: Self-pay | Admitting: Family Medicine

## 2011-04-17 VITALS — BP 126/82 | HR 60 | Resp 16 | Ht 67.0 in | Wt 170.0 lb

## 2011-04-17 DIAGNOSIS — I6350 Cerebral infarction due to unspecified occlusion or stenosis of unspecified cerebral artery: Secondary | ICD-10-CM

## 2011-04-17 DIAGNOSIS — I639 Cerebral infarction, unspecified: Secondary | ICD-10-CM

## 2011-04-17 DIAGNOSIS — J4489 Other specified chronic obstructive pulmonary disease: Secondary | ICD-10-CM

## 2011-04-17 DIAGNOSIS — J449 Chronic obstructive pulmonary disease, unspecified: Secondary | ICD-10-CM

## 2011-04-17 DIAGNOSIS — I10 Essential (primary) hypertension: Secondary | ICD-10-CM

## 2011-04-17 DIAGNOSIS — Z23 Encounter for immunization: Secondary | ICD-10-CM

## 2011-04-17 DIAGNOSIS — F329 Major depressive disorder, single episode, unspecified: Secondary | ICD-10-CM

## 2011-04-17 DIAGNOSIS — E739 Lactose intolerance, unspecified: Secondary | ICD-10-CM

## 2011-04-17 DIAGNOSIS — F17200 Nicotine dependence, unspecified, uncomplicated: Secondary | ICD-10-CM

## 2011-04-17 DIAGNOSIS — F411 Generalized anxiety disorder: Secondary | ICD-10-CM

## 2011-04-17 DIAGNOSIS — E785 Hyperlipidemia, unspecified: Secondary | ICD-10-CM

## 2011-04-17 DIAGNOSIS — K219 Gastro-esophageal reflux disease without esophagitis: Secondary | ICD-10-CM

## 2011-04-17 DIAGNOSIS — F3289 Other specified depressive episodes: Secondary | ICD-10-CM

## 2011-04-17 DIAGNOSIS — M1990 Unspecified osteoarthritis, unspecified site: Secondary | ICD-10-CM

## 2011-04-17 MED ORDER — INFLUENZA VAC TYPES A & B PF IM SUSP: 0.5000 mL | Freq: Once | INTRAMUSCULAR | Status: DC

## 2011-04-17 NOTE — Patient Instructions (Signed)
F/u early January.  PLEASE cancel September visit.  Please do labwork 09/26 as before.   Flu vaccine today.  No changes in your medication.  I am glad that you are feeling well and trust you will continue to do so.  Please call if you need to see me before January.  If labs in September are abnormal we will contact you/the facility where you stay.  Please plan to and try to stop smoking

## 2011-04-23 ENCOUNTER — Encounter: Payer: Self-pay | Admitting: Family Medicine

## 2011-04-23 NOTE — Assessment & Plan Note (Signed)
Controlled, no change in medication  

## 2011-04-23 NOTE — Progress Notes (Signed)
  Subjective:    Patient ID: [name] [name], male    DOB: Aug 25, 1940, 70 y.o.   MRN: [id number]  HPI The PT is here for follow up and re-evaluation of chronic medical conditions, medication management and review of any available recent lab and radiology data.  Preventive health is updated, specifically  Cancer screening and Immunization.   Questions or concerns regarding consultations or procedures which the PT has had in the interim are  addressed. The PT denies any adverse reactions to current medications since the last visit.  There are no new concerns.  There are no specific complaints       Review of Systems Denies recent fever or chills. Denies sinus pressure, nasal congestion, ear pain or sore throat. Denies chest congestion, productive cough or wheezing. Denies chest pains, palpitations and leg swelling Denies abdominal pain, nausea, vomiting,diarrhea or constipation.   Denies dysuria, frequency, hesitancy or incontinence. Chronic  joint pain, swelling and limitation in mobility. Denies headaches, seizures, numbness, or tingling. Denies uncontrolled  depression, anxiety or insomnia. Denies skin break down or rash.        Objective:   Physical Exam Patient alert and  in no cardiopulmonary distress.  HEENT: No facial asymmetry, EOMI, no sinus tenderness,  oropharynx pink and moist.  Neck decreased ROM no adenopathy.  Chest: Clear to auscultation bilaterally.Decreased air entry throughout  CVS: S1, S2 no murmurs, no S3.  ABD: Soft non tender. Bowel sounds normal.  Ext: No edema  MS: decreased  ROM spine, shoulders, hips and knees.  Skin: Intact, no ulcerations or rash noted.  Psych: Good eye contact, normal affect. Memory loss not anxious or depressed appearing.  CNS: CN 2-12 intact        Assessment & Plan:

## 2011-04-23 NOTE — Assessment & Plan Note (Signed)
Needs rept h BA1C to establish level of control

## 2011-04-23 NOTE — Assessment & Plan Note (Signed)
Fasting labs past due, low fat diet encouraged 

## 2011-04-23 NOTE — Assessment & Plan Note (Signed)
Chronic pain management will remain unchanged

## 2011-04-23 NOTE — Assessment & Plan Note (Signed)
Pt to continue current med regime, asymptomatic on this

## 2011-04-23 NOTE — Assessment & Plan Note (Signed)
Unchanged, and no interest in quitting at this time

## 2011-04-25 ENCOUNTER — Encounter: Payer: Medicare Other | Admitting: *Deleted

## 2011-04-26 ENCOUNTER — Ambulatory Visit (INDEPENDENT_AMBULATORY_CARE_PROVIDER_SITE_OTHER): Payer: Medicare Other | Admitting: *Deleted

## 2011-04-26 DIAGNOSIS — Z7901 Long term (current) use of anticoagulants: Secondary | ICD-10-CM

## 2011-04-26 DIAGNOSIS — I679 Cerebrovascular disease, unspecified: Secondary | ICD-10-CM

## 2011-04-26 DIAGNOSIS — I2699 Other pulmonary embolism without acute cor pulmonale: Secondary | ICD-10-CM

## 2011-04-27 LAB — B-NATRIURETIC PEPTIDE (CONVERTED LAB): Pro B Natriuretic peptide (BNP): 63.8

## 2011-04-27 LAB — BASIC METABOLIC PANEL
BUN: 11
CO2: 23
Chloride: 110
Creatinine, Ser: 1.19

## 2011-04-27 LAB — DIFFERENTIAL
Basophils Absolute: 0
Basophils Relative: 1
Eosinophils Absolute: 0.3
Neutrophils Relative %: 36 — ABNORMAL LOW

## 2011-04-27 LAB — CBC
MCHC: 35.5
MCV: 91.9
Platelets: 122 — ABNORMAL LOW
RDW: 13.1

## 2011-05-03 LAB — HEPATIC FUNCTION PANEL
ALT: <8 U/L (ref 0–53)
Bilirubin, Direct: 0.1 mg/dL (ref 0.0–0.3)

## 2011-05-03 LAB — HEMOGLOBIN A1C
Hgb A1c MFr Bld: 5.9 % — ABNORMAL HIGH (ref <5.7)
Mean Plasma Glucose: 123 mg/dL — ABNORMAL HIGH (ref <117)

## 2011-05-03 LAB — LIPID PANEL
Cholesterol: 154 mg/dL (ref 0–200)
Total CHOL/HDL Ratio: 4.3 Ratio
VLDL: 23 mg/dL (ref 0–40)

## 2011-05-07 ENCOUNTER — Telehealth: Payer: Self-pay | Admitting: Family Medicine

## 2011-05-07 NOTE — Telephone Encounter (Signed)
The form is in the front complete, wait time for papers is one week pls also remind them

## 2011-05-22 ENCOUNTER — Ambulatory Visit: Payer: Medicare Other | Admitting: Family Medicine

## 2011-05-24 ENCOUNTER — Ambulatory Visit (INDEPENDENT_AMBULATORY_CARE_PROVIDER_SITE_OTHER): Payer: Medicare Other | Admitting: *Deleted

## 2011-05-24 DIAGNOSIS — Z7901 Long term (current) use of anticoagulants: Secondary | ICD-10-CM

## 2011-05-24 DIAGNOSIS — I2699 Other pulmonary embolism without acute cor pulmonale: Secondary | ICD-10-CM

## 2011-05-24 DIAGNOSIS — I679 Cerebrovascular disease, unspecified: Secondary | ICD-10-CM

## 2011-05-24 LAB — POCT INR: INR: 3.1

## 2011-06-21 ENCOUNTER — Ambulatory Visit (INDEPENDENT_AMBULATORY_CARE_PROVIDER_SITE_OTHER): Payer: Medicare Other | Admitting: *Deleted

## 2011-06-21 DIAGNOSIS — Z7901 Long term (current) use of anticoagulants: Secondary | ICD-10-CM

## 2011-06-21 DIAGNOSIS — I2699 Other pulmonary embolism without acute cor pulmonale: Secondary | ICD-10-CM

## 2011-06-21 DIAGNOSIS — I679 Cerebrovascular disease, unspecified: Secondary | ICD-10-CM

## 2011-06-21 LAB — POCT INR: INR: 3.1

## 2011-07-04 ENCOUNTER — Other Ambulatory Visit: Payer: Self-pay | Admitting: *Deleted

## 2011-07-04 MED ORDER — WARFARIN SODIUM 4 MG PO TABS: 4.0000 mg | ORAL_TABLET | ORAL | Status: DC

## 2011-07-06 ENCOUNTER — Other Ambulatory Visit: Payer: Self-pay

## 2011-07-06 MED ORDER — MAGNESIUM HYDROXIDE 400 MG/5ML PO SUSP: ORAL | Status: DC

## 2011-07-06 MED ORDER — TAMSULOSIN HCL 0.4 MG PO CAPS: ORAL_CAPSULE | ORAL | Status: DC

## 2011-07-06 MED ORDER — ALPRAZOLAM 0.25 MG PO TABS: ORAL_TABLET | ORAL | Status: DC

## 2011-07-19 ENCOUNTER — Ambulatory Visit (INDEPENDENT_AMBULATORY_CARE_PROVIDER_SITE_OTHER): Payer: Medicare Other | Admitting: *Deleted

## 2011-07-19 DIAGNOSIS — I679 Cerebrovascular disease, unspecified: Secondary | ICD-10-CM

## 2011-07-19 DIAGNOSIS — I2699 Other pulmonary embolism without acute cor pulmonale: Secondary | ICD-10-CM

## 2011-07-19 DIAGNOSIS — Z7901 Long term (current) use of anticoagulants: Secondary | ICD-10-CM

## 2011-07-19 LAB — POCT INR: INR: 2.3

## 2011-07-25 ENCOUNTER — Other Ambulatory Visit: Payer: Self-pay | Admitting: *Deleted

## 2011-07-25 ENCOUNTER — Other Ambulatory Visit: Payer: Self-pay | Admitting: Family Medicine

## 2011-07-25 MED ORDER — WARFARIN SODIUM 4 MG PO TABS: 4.0000 mg | ORAL_TABLET | ORAL | Status: DC

## 2011-08-06 ENCOUNTER — Encounter: Payer: Self-pay | Admitting: Family Medicine

## 2011-08-08 ENCOUNTER — Ambulatory Visit (INDEPENDENT_AMBULATORY_CARE_PROVIDER_SITE_OTHER): Payer: Medicare Other | Admitting: Family Medicine

## 2011-08-08 ENCOUNTER — Ambulatory Visit (INDEPENDENT_AMBULATORY_CARE_PROVIDER_SITE_OTHER): Payer: Medicare Other | Admitting: Cardiology

## 2011-08-08 ENCOUNTER — Encounter: Payer: Self-pay | Admitting: Cardiology

## 2011-08-08 DIAGNOSIS — E119 Type 2 diabetes mellitus without complications: Secondary | ICD-10-CM | POA: Diagnosis not present

## 2011-08-08 DIAGNOSIS — I10 Essential (primary) hypertension: Secondary | ICD-10-CM

## 2011-08-08 DIAGNOSIS — Z23 Encounter for immunization: Secondary | ICD-10-CM

## 2011-08-08 DIAGNOSIS — E538 Deficiency of other specified B group vitamins: Secondary | ICD-10-CM | POA: Diagnosis not present

## 2011-08-08 DIAGNOSIS — R7301 Impaired fasting glucose: Secondary | ICD-10-CM | POA: Diagnosis not present

## 2011-08-08 DIAGNOSIS — F329 Major depressive disorder, single episode, unspecified: Secondary | ICD-10-CM | POA: Insufficient documentation

## 2011-08-08 DIAGNOSIS — K219 Gastro-esophageal reflux disease without esophagitis: Secondary | ICD-10-CM | POA: Insufficient documentation

## 2011-08-08 DIAGNOSIS — I679 Cerebrovascular disease, unspecified: Secondary | ICD-10-CM | POA: Insufficient documentation

## 2011-08-08 DIAGNOSIS — E785 Hyperlipidemia, unspecified: Secondary | ICD-10-CM | POA: Diagnosis not present

## 2011-08-08 DIAGNOSIS — E1159 Type 2 diabetes mellitus with other circulatory complications: Secondary | ICD-10-CM | POA: Diagnosis not present

## 2011-08-08 DIAGNOSIS — F17200 Nicotine dependence, unspecified, uncomplicated: Secondary | ICD-10-CM

## 2011-08-08 DIAGNOSIS — D696 Thrombocytopenia, unspecified: Secondary | ICD-10-CM | POA: Insufficient documentation

## 2011-08-08 DIAGNOSIS — E669 Obesity, unspecified: Secondary | ICD-10-CM

## 2011-08-08 DIAGNOSIS — E739 Lactose intolerance, unspecified: Secondary | ICD-10-CM

## 2011-08-08 DIAGNOSIS — Z7901 Long term (current) use of anticoagulants: Secondary | ICD-10-CM | POA: Diagnosis not present

## 2011-08-08 DIAGNOSIS — F419 Anxiety disorder, unspecified: Secondary | ICD-10-CM

## 2011-08-08 DIAGNOSIS — F341 Dysthymic disorder: Secondary | ICD-10-CM

## 2011-08-08 DIAGNOSIS — I2699 Other pulmonary embolism without acute cor pulmonale: Secondary | ICD-10-CM | POA: Insufficient documentation

## 2011-08-08 DIAGNOSIS — M159 Polyosteoarthritis, unspecified: Secondary | ICD-10-CM | POA: Insufficient documentation

## 2011-08-08 DIAGNOSIS — F1910 Other psychoactive substance abuse, uncomplicated: Secondary | ICD-10-CM | POA: Insufficient documentation

## 2011-08-08 LAB — COMPREHENSIVE METABOLIC PANEL
ALT: <8 U/L (ref 0–53)
AST: 9 U/L (ref 0–37)
Albumin: 3.7 g/dL (ref 3.5–5.2)
Alkaline Phosphatase: 81 U/L (ref 39–117)
Glucose, Bld: 80 mg/dL (ref 70–99)
Potassium: 4.2 mEq/L (ref 3.5–5.3)
Sodium: 137 mEq/L (ref 135–145)
Total Bilirubin: 0.4 mg/dL (ref 0.3–1.2)
Total Protein: 7.9 g/dL (ref 6.0–8.3)

## 2011-08-08 MED ORDER — TUBERCULIN PPD 5 UNIT/0.1ML ID SOLN
5.0000 [IU] | Freq: Once | INTRADERMAL | Status: AC
  Administered 2011-08-08: 5 [IU] via INTRADERMAL

## 2011-08-08 NOTE — Progress Notes (Signed)
Patient ID: [name] [name], male   DOB: 09-07-1940, 71 y.o.   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  He resides in a rest home, and has done well since his last visit to the office.  He has not required care in the emergency department or hospital, has not developed any new symptoms nor has he any new medical problems.  He denies dyspnea, orthopnea, PND, palpitations, chest discomfort, lightheadedness or syncope.  He walks short distances with a walker.  Prior to Admission medications   Medication Sig Start Date End Date Taking? Authorizing Provider  ALPRAZolam (XANAX) 0.25 MG tablet TAKE ONE TABLET BY MOUTH 3 TIMES DAILY AS NEEDEDFOR ANXIETY. 07/06/11  Yes [name] Overman, MD  CELEXA 40 MG tablet TAKE ONE TABLET BY MOUTH ONCE DAILY. 12/28/10  Yes [name] Overman, MD  COLACE 100 MG capsule TAKE 2 CAPSULES BY MOUTH TWICE DAILY. 02/02/11  Yes [name] Overman, MD  COREG 25 MG tablet TAKE 1 TABLET BY MOUTH   TWICE DAILY WITH MEALS. 02/02/11  Yes [name] Overman, MD  DEXILANT 60 MG capsule TAKE ONE CAPSULE BY MOUTHONCE DAILY. 12/28/10  Yes [name] Overman, MD  dimenhyDRINATE (DRAMAMINE) 50 MG tablet Take 50 mg by mouth every 4 (four) hours as needed.     Yes Historical Provider, MD  fesoterodine (TOVIAZ) 4 MG TB24 Take 4 mg by mouth daily.     Yes Historical Provider, MD  FLOMAX 0.4 MG CAPS TAKE 1 CAPSULE BY MOUTH ONCE DAILY. 07/25/11  Yes [name] Overman, MD  IMDUR 30 MG 24 hr tablet TAKE ONE TABLET BY MOUTH ONCE DAILY. 12/01/10  Yes [name] Overman, MD  IMODIUM A-D 2 MG tablet TAKE 1 TABLET BY MOUTH   AFTER EACH LOOSE STOOL ASNEEDED FOR DIARRHEA. NO  MORE THAN 6/24 HOURS. 10/25/10  Yes [name] Overman, MD  LASIX 40 MG tablet TAKE ONE TABLET BY MOUTH ONCE DAILY. 12/01/10  Yes [name] Overman, MD  lisinopril (PRINIVIL,ZESTRIL) 20 MG tablet Take 1  tablet (20 mg total) by mouth daily. 03/07/11 03/06/12 Yes [name] Overman, MD  MAALOX [phone] MG/5ML suspension TAKE 2 TABLESPOONFUL     ( ) BY MOUTH AS NEEDEDFOR HEARTBURN OR         INDIGESTION UP TO 4 TIMES 10/25/10  Yes [name] Overman, MD  magnesium hydroxide (PHILLIPS MILK OF MAGNESIA) 400 MG/5ML suspension TAKE BY MOUTH AS    NEEDED AS SINGLE DOSE FORCONSTIPATION. 07/06/11  Yes [name] Overman, MD  meclizine (ANTIVERT) 25 MG tablet Take 25 mg by mouth 3 (three) times daily as needed.     Yes Historical Provider, MD  Neomycin-Bacitracin-Polymyxin (NEOSPORIN ORIGINAL) [phone] OINT APPLY TO MINOR SKIN TEARSAS NEEDED. 10/25/10  Yes [name] Overman, MD  PRAVACHOL 40 MG tablet TAKE ONE TABLET BY MOUTH AT BEDTIME. 12/28/10  Yes [name] Overman, MD  sodium phosphate (FLEET) enema One every three days if no bowel movement 11/29/10 11/29/11 Yes [name] Overman, MD  Starch-Maltodextrin (THICK-IT PO) Take by mouth. Use as directed    Yes Historical Provider, MD  sulfamethoxazole-trimethoprim (BACTRIM DS) 800-160 MG per tablet One half tab by mouth daily    Yes Historical Provider, MD  traMADol (ULTRAM) 50 MG tablet Take 50 mg by mouth 2 (two) times daily as needed.     Yes Historical Provider, MD  TUSSIN 100 MG/5ML syrup TAKE 2 TEASPOONSFUL (10  ML) BY MOUTH EVERY 6HOURS AS NEEDED  FOR COUGHUP TO 72 HOURS. 10/25/10  Yes [name] Overman, MD  TYLENOL 325 MG tablet TAKE (1) TABLET BY MOUTH (3) TIMES DAILY. 12/01/10  Yes [name] Overman, MD  warfarin (COUMADIN) 4 MG tablet Take 1 tablet (4 mg total) by mouth as directed. 07/25/11  Yes [name] Friends. Dietrich Pates, MD    No Known Allergies    Past medical history, social history, and family history reviewed and updated.  ROS: See history of present illness.  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  No previous tracing for comparison.  ASSESSMENT AND PLAN:  [name] Bing, MD 08/08/2011 4:34 PM

## 2011-08-08 NOTE — Patient Instructions (Signed)
**Note De-Identified  Obfuscation** Your physician recommends that you complete 3 hemoccult cards and return to this office. (Please follow instruction in envelope).  Your physician recommends that you schedule a follow-up appointment in: 1 year

## 2011-08-08 NOTE — Assessment & Plan Note (Signed)
Laboratory results have been consistently normal.  If glucose tolerance did exist in the past, it appears to have resolved, perhaps as the result of weight loss.

## 2011-08-08 NOTE — Patient Instructions (Signed)
F/U in 4 month  Nurse visit on Friday  For reading of Tb test  Labs today.   Fasting labs in 4 months 1 week before next visit   Pls cut down by 1 cigarette each month, 6 per month in Jan etc.  KEEP SMILING, THAT WILL KEEP YOU BUSY

## 2011-08-09 LAB — CBC
HCT: 41.4 % (ref 39.0–52.0)
MCH: 32.3 pg (ref 26.0–34.0)
MCV: 92.8 fL (ref 78.0–100.0)
RDW: 12.6 % (ref 11.5–15.5)
WBC: 4.7 10*3/uL (ref 4.0–10.5)

## 2011-08-12 NOTE — Assessment & Plan Note (Signed)
Diet controlled and doing well, reduce blood sugar testing to 3 times weekly

## 2011-08-12 NOTE — Progress Notes (Signed)
  Subjective:    Patient ID: [name] [name], male    DOB: 1940-10-31, 71 y.o.   MRN: [id number]  HPI The PT is here for follow up and re-evaluation of chronic medical conditions, medication management and review of any available recent lab and radiology data.  Preventive health is updated, specifically  Cancer screening and Immunization.   Questions or concerns regarding consultations or procedures which the PT has had in the interim are  addressed. The PT denies any adverse reactions to current medications since the last visit.  There are no new concerns. Requests tb skin test placement. There are no specific complaints       Review of Systems See HPI Denies recent fever or chills. Denies sinus pressure, nasal congestion, ear pain or sore throat. Denies chest congestion, productive cough or wheezing. Denies chest pains, palpitations and leg swelling Denies abdominal pain, nausea, vomiting,diarrhea or constipation.   Denies dysuria, frequency, hesitancy or incontinence.c/o malodorous urine, which is  Essentially chronic. No flank pain, fever or chills  limitation in mobility.foloowiing CVA with unsteady gait and high fall risk, uses PWC Denies headaches, seizures, numbness, or tingling.  Denies uncontrolled depression, anxiety or insomnia. Denies skin break down or rash.        Objective:   Physical Exam Patient alert and oriented and in no cardiopulmonary distress.  HEENT: No facial asymmetry, EOMI, no sinus tenderness,  oropharynx pink and moist.  Neck supple no adenopathy.  Chest: Clear to auscultation bilaterally.Decreased air entry throughout  CVS: S1, S2 no murmurs, no S3.  ABD: Soft non tender. Bowel sounds normal.  Ext: No edema  MS: Decreased ROM spine, shoulders, hips and knees.  Skin: Intact, no ulcerations or rash noted.  Psych: Good eye contact, normal affect. Memory impaired not anxious or depressed appearing.  CNS: CN 2-12 intact, power,  normal  throughout.        Assessment & Plan:

## 2011-08-12 NOTE — Assessment & Plan Note (Signed)
CBC and stool for Hemoccult testing will be monitored to exclude occult GI blood loss.

## 2011-08-12 NOTE — Assessment & Plan Note (Signed)
Cessation counselling done , pt to begin monthly taper

## 2011-08-12 NOTE — Assessment & Plan Note (Signed)
Controlled, no change in medication  

## 2011-08-12 NOTE — Assessment & Plan Note (Signed)
Hyperlipidemia:Low fat diet discussed and encouraged.  Controlled, no change in medication   

## 2011-08-16 ENCOUNTER — Encounter (INDEPENDENT_AMBULATORY_CARE_PROVIDER_SITE_OTHER): Payer: Medicare Other

## 2011-08-16 ENCOUNTER — Ambulatory Visit (INDEPENDENT_AMBULATORY_CARE_PROVIDER_SITE_OTHER): Payer: Medicare Other | Admitting: *Deleted

## 2011-08-16 DIAGNOSIS — Z7901 Long term (current) use of anticoagulants: Secondary | ICD-10-CM | POA: Diagnosis not present

## 2011-08-16 DIAGNOSIS — I679 Cerebrovascular disease, unspecified: Secondary | ICD-10-CM

## 2011-08-16 DIAGNOSIS — N390 Urinary tract infection, site not specified: Secondary | ICD-10-CM | POA: Diagnosis not present

## 2011-08-16 DIAGNOSIS — I2699 Other pulmonary embolism without acute cor pulmonale: Secondary | ICD-10-CM

## 2011-08-16 LAB — HEMOCCULT GUIAC POC 1CARD (OFFICE)
Card #3 Fecal Occult Blood, POC: NEGATIVE
Fecal Occult Blood, POC: NEGATIVE

## 2011-08-24 ENCOUNTER — Ambulatory Visit: Payer: Medicare Other | Admitting: Family Medicine

## 2011-09-04 ENCOUNTER — Telehealth: Payer: Self-pay | Admitting: Family Medicine

## 2011-09-04 NOTE — Telephone Encounter (Signed)
pls check with pt's [facility] re alternate meds for dexilant 60mg  and toviaz4mg , not preferred, paperwork is in your folder, let me know the alternatives preferred so new scripts can be sent and the pt will be informed after

## 2011-09-13 ENCOUNTER — Encounter: Payer: Medicare Other | Admitting: *Deleted

## 2011-09-19 DIAGNOSIS — N319 Neuromuscular dysfunction of bladder, unspecified: Secondary | ICD-10-CM | POA: Diagnosis not present

## 2011-09-19 DIAGNOSIS — N3944 Nocturnal enuresis: Secondary | ICD-10-CM | POA: Diagnosis not present

## 2011-09-20 NOTE — Telephone Encounter (Signed)
Toviaz ER 4mg ---change to oxybutynin or vesicare Dexilant DR 60mg ---omeprazole or pantoprazole

## 2011-09-21 ENCOUNTER — Other Ambulatory Visit: Payer: Self-pay | Admitting: Family Medicine

## 2011-09-21 ENCOUNTER — Other Ambulatory Visit: Payer: Self-pay

## 2011-09-21 MED ORDER — PANTOPRAZOLE SODIUM 40 MG PO TBEC: 40.0000 mg | DELAYED_RELEASE_TABLET | Freq: Every day | ORAL | Status: DC

## 2011-09-21 MED ORDER — SOLIFENACIN SUCCINATE 10 MG PO TABS: 10.0000 mg | ORAL_TABLET | Freq: Every day | ORAL | Status: DC

## 2011-09-21 NOTE — Telephone Encounter (Signed)
Changes made historically pls send in and notify pt/facility

## 2011-09-26 ENCOUNTER — Ambulatory Visit (INDEPENDENT_AMBULATORY_CARE_PROVIDER_SITE_OTHER): Payer: Medicare Other | Admitting: *Deleted

## 2011-09-26 DIAGNOSIS — Z7901 Long term (current) use of anticoagulants: Secondary | ICD-10-CM

## 2011-09-26 DIAGNOSIS — I2699 Other pulmonary embolism without acute cor pulmonale: Secondary | ICD-10-CM | POA: Diagnosis not present

## 2011-09-26 DIAGNOSIS — I679 Cerebrovascular disease, unspecified: Secondary | ICD-10-CM | POA: Diagnosis not present

## 2011-10-29 DIAGNOSIS — N319 Neuromuscular dysfunction of bladder, unspecified: Secondary | ICD-10-CM | POA: Diagnosis not present

## 2011-10-29 DIAGNOSIS — N3944 Nocturnal enuresis: Secondary | ICD-10-CM | POA: Diagnosis not present

## 2011-10-29 DIAGNOSIS — N3941 Urge incontinence: Secondary | ICD-10-CM | POA: Diagnosis not present

## 2011-10-31 ENCOUNTER — Ambulatory Visit (INDEPENDENT_AMBULATORY_CARE_PROVIDER_SITE_OTHER): Payer: Medicare Other | Admitting: *Deleted

## 2011-10-31 DIAGNOSIS — Z7901 Long term (current) use of anticoagulants: Secondary | ICD-10-CM | POA: Diagnosis not present

## 2011-11-05 ENCOUNTER — Other Ambulatory Visit: Payer: Self-pay | Admitting: Family Medicine

## 2011-11-12 DIAGNOSIS — N319 Neuromuscular dysfunction of bladder, unspecified: Secondary | ICD-10-CM | POA: Diagnosis not present

## 2011-11-12 DIAGNOSIS — N3944 Nocturnal enuresis: Secondary | ICD-10-CM | POA: Diagnosis not present

## 2011-11-20 DIAGNOSIS — E119 Type 2 diabetes mellitus without complications: Secondary | ICD-10-CM | POA: Diagnosis not present

## 2011-11-20 DIAGNOSIS — E1159 Type 2 diabetes mellitus with other circulatory complications: Secondary | ICD-10-CM | POA: Diagnosis not present

## 2011-11-22 ENCOUNTER — Other Ambulatory Visit: Payer: Self-pay | Admitting: Family Medicine

## 2011-11-28 ENCOUNTER — Ambulatory Visit: Payer: Medicare Other | Admitting: Family Medicine

## 2011-11-28 DIAGNOSIS — I10 Essential (primary) hypertension: Secondary | ICD-10-CM | POA: Diagnosis not present

## 2011-11-28 DIAGNOSIS — R7301 Impaired fasting glucose: Secondary | ICD-10-CM | POA: Diagnosis not present

## 2011-11-28 DIAGNOSIS — E785 Hyperlipidemia, unspecified: Secondary | ICD-10-CM | POA: Diagnosis not present

## 2011-11-29 LAB — LIPID PANEL
Cholesterol: 157 mg/dL (ref 0–200)
LDL Cholesterol: 94 mg/dL (ref 0–99)
Total CHOL/HDL Ratio: 3.9 Ratio
VLDL: 23 mg/dL (ref 0–40)

## 2011-11-29 LAB — COMPREHENSIVE METABOLIC PANEL
ALT: <8 U/L (ref 0–53)
AST: 12 U/L (ref 0–37)
Albumin: 3.9 g/dL (ref 3.5–5.2)
Alkaline Phosphatase: 74 U/L (ref 39–117)
BUN: 26 mg/dL — ABNORMAL HIGH (ref 6–23)
Creat: 1.25 mg/dL (ref 0.50–1.35)
Potassium: 4.4 mEq/L (ref 3.5–5.3)

## 2011-12-03 ENCOUNTER — Ambulatory Visit: Payer: Medicare Other | Admitting: Family Medicine

## 2011-12-06 ENCOUNTER — Ambulatory Visit (INDEPENDENT_AMBULATORY_CARE_PROVIDER_SITE_OTHER): Payer: Medicare Other | Admitting: Family Medicine

## 2011-12-06 ENCOUNTER — Ambulatory Visit: Payer: Medicare Other | Admitting: Family Medicine

## 2011-12-06 ENCOUNTER — Encounter: Payer: Self-pay | Admitting: Family Medicine

## 2011-12-06 VITALS — BP 100/70 | HR 60 | Temp 97.6°F | Resp 16 | Ht 67.0 in | Wt 200.4 lb

## 2011-12-06 DIAGNOSIS — F17200 Nicotine dependence, unspecified, uncomplicated: Secondary | ICD-10-CM

## 2011-12-06 DIAGNOSIS — I10 Essential (primary) hypertension: Secondary | ICD-10-CM

## 2011-12-06 DIAGNOSIS — R05 Cough: Secondary | ICD-10-CM

## 2011-12-06 DIAGNOSIS — E669 Obesity, unspecified: Secondary | ICD-10-CM

## 2011-12-06 DIAGNOSIS — K219 Gastro-esophageal reflux disease without esophagitis: Secondary | ICD-10-CM | POA: Diagnosis not present

## 2011-12-06 DIAGNOSIS — J309 Allergic rhinitis, unspecified: Secondary | ICD-10-CM | POA: Diagnosis not present

## 2011-12-06 DIAGNOSIS — E739 Lactose intolerance, unspecified: Secondary | ICD-10-CM

## 2011-12-06 DIAGNOSIS — R059 Cough, unspecified: Secondary | ICD-10-CM

## 2011-12-06 MED ORDER — BENZONATATE 100 MG PO CAPS: 100.0000 mg | ORAL_CAPSULE | Freq: Four times a day (QID) | ORAL | Status: DC | PRN

## 2011-12-06 MED ORDER — FLUTICASONE PROPIONATE 50 MCG/ACT NA SUSP: 2.0000 | Freq: Every day | NASAL | Status: DC

## 2011-12-06 NOTE — Progress Notes (Signed)
  Subjective:    Patient ID: [name] [name], male    DOB: 1940-12-21, 71 y.o.   MRN: [id number]  HPI The PT is here for follow up and re-evaluation of chronic medical conditions, medication management and review of any available recent lab and radiology data.  Preventive health is updated, specifically  Cancer screening and Immunization.   Questions or concerns regarding consultations or procedures which the PT has had in the interim are  addressed. The PT denies any adverse reactions to current medications since the last visit.  C/o increased nasal stuffiness and congestion with uncontrolled cough in the past month. Denies fever or chills.    Review of Systems See HPI Denies recent fever or chills. Denies sinus pressure,does have  nasal congestion, denies ear pain or sore throat. Chronic  chest congestion, cough currently increased and uncontrolled, denies wheezing. Denies chest pains, palpitations and leg swelling Denies abdominal pain, nausea, vomiting,diarrhea or constipation.   Denies dysuria, frequency, hesitancy or incontinence. Chronic  joint pain, and limitation in mobility. Denies headaches, seizures, numbness, or tingling. Denies depression, anxiety or insomnia. Denies skin break down or rash.        Objective:   Physical Exam  Patient alert  and in no cardiopulmonary distress.  HEENT: No facial asymmetry, EOMI, no sinus tenderness,  oropharynx pink and moist.  Neck decreased ROM no adenopathy.  Chest: decreased though adequate air entry, few crackles bibasilar , no wheeze  CVS: S1, S2 no murmurs, no S3.  ABD: Soft non tender. Bowel sounds normal.  Ext: No edema  MS: Decrease ROM spine, shoulders, hips and knees.Wheelchair dependent  Skin: Intact, no ulcerations or rash noted.  Psych: Good eye contact,  Memory loss not anxious or depressed appearing.  CNS: CN 2-12 intact       Assessment & Plan:

## 2011-12-06 NOTE — Patient Instructions (Signed)
F/u in 4.5 month  Medication sent in for nasal congestion and chest congestion.  No other medication changes.  You need to stop smoking.  Recent labs are excellent.  Please call if you need me before next appointment

## 2011-12-08 DIAGNOSIS — E669 Obesity, unspecified: Secondary | ICD-10-CM | POA: Insufficient documentation

## 2011-12-08 NOTE — Assessment & Plan Note (Signed)
Deteriorated. Patient re-educated about  the importance of commitment to a  minimum of 150 minutes of exercise per week. The importance of healthy food choices with portion control discussed. Encouraged to start a food diary, count calories and to consider  joining a support group. Sample diet sheets offered. Goals set by the patient for the next several months.    

## 2011-12-08 NOTE — Assessment & Plan Note (Signed)
Increased and uncontrolled due to uncontrolled allergies and nicotine, med prescribed

## 2011-12-08 NOTE — Assessment & Plan Note (Signed)
Controlled, no change in medication  

## 2011-12-08 NOTE — Assessment & Plan Note (Signed)
Increased and uncontrolled symptoms, med prescribed 

## 2011-12-08 NOTE — Assessment & Plan Note (Signed)
Unchanged, unwilling to quit 

## 2011-12-08 NOTE — Assessment & Plan Note (Signed)
Improved, hBA1C still elevated however

## 2011-12-12 ENCOUNTER — Ambulatory Visit (INDEPENDENT_AMBULATORY_CARE_PROVIDER_SITE_OTHER): Payer: Medicare Other | Admitting: *Deleted

## 2011-12-12 DIAGNOSIS — Z7901 Long term (current) use of anticoagulants: Secondary | ICD-10-CM | POA: Diagnosis not present

## 2011-12-18 ENCOUNTER — Other Ambulatory Visit: Payer: Self-pay | Admitting: Family Medicine

## 2011-12-24 ENCOUNTER — Telehealth: Payer: Self-pay | Admitting: Family Medicine

## 2011-12-25 DIAGNOSIS — N319 Neuromuscular dysfunction of bladder, unspecified: Secondary | ICD-10-CM | POA: Diagnosis not present

## 2011-12-25 DIAGNOSIS — N3941 Urge incontinence: Secondary | ICD-10-CM | POA: Diagnosis not present

## 2011-12-25 NOTE — Telephone Encounter (Signed)
Called [facility] and they said that they will have to fill out a form and send it here for the dr to sign and they would fax it

## 2011-12-27 ENCOUNTER — Telehealth: Payer: Self-pay | Admitting: Family Medicine

## 2011-12-27 NOTE — Telephone Encounter (Signed)
done

## 2011-12-27 NOTE — Telephone Encounter (Signed)
Can you write this for pt in facility?

## 2012-01-08 ENCOUNTER — Other Ambulatory Visit: Payer: Self-pay | Admitting: Family Medicine

## 2012-01-11 ENCOUNTER — Telehealth: Payer: Self-pay | Admitting: Family Medicine

## 2012-01-11 NOTE — Telephone Encounter (Signed)
rx written please fax and let facility know

## 2012-01-11 NOTE — Telephone Encounter (Signed)
rx faxed

## 2012-01-23 ENCOUNTER — Ambulatory Visit (INDEPENDENT_AMBULATORY_CARE_PROVIDER_SITE_OTHER): Payer: Medicare Other | Admitting: *Deleted

## 2012-01-23 DIAGNOSIS — Z7901 Long term (current) use of anticoagulants: Secondary | ICD-10-CM | POA: Diagnosis not present

## 2012-01-29 ENCOUNTER — Telehealth: Payer: Self-pay | Admitting: Family Medicine

## 2012-01-29 DIAGNOSIS — E119 Type 2 diabetes mellitus without complications: Secondary | ICD-10-CM | POA: Diagnosis not present

## 2012-01-29 DIAGNOSIS — E1159 Type 2 diabetes mellitus with other circulatory complications: Secondary | ICD-10-CM | POA: Diagnosis not present

## 2012-01-29 NOTE — Telephone Encounter (Signed)
Spoke with [name] [name] from home away from home and sent over copy of written rx as well as clarification to discontinue blood sugar checks.

## 2012-02-04 ENCOUNTER — Other Ambulatory Visit: Payer: Self-pay | Admitting: Family Medicine

## 2012-02-13 DIAGNOSIS — N319 Neuromuscular dysfunction of bladder, unspecified: Secondary | ICD-10-CM | POA: Diagnosis not present

## 2012-02-13 DIAGNOSIS — N3000 Acute cystitis without hematuria: Secondary | ICD-10-CM | POA: Diagnosis not present

## 2012-02-13 DIAGNOSIS — R350 Frequency of micturition: Secondary | ICD-10-CM | POA: Diagnosis not present

## 2012-02-27 ENCOUNTER — Ambulatory Visit (INDEPENDENT_AMBULATORY_CARE_PROVIDER_SITE_OTHER): Payer: Medicare Other | Admitting: *Deleted

## 2012-02-27 DIAGNOSIS — I2699 Other pulmonary embolism without acute cor pulmonale: Secondary | ICD-10-CM

## 2012-02-27 DIAGNOSIS — Z7901 Long term (current) use of anticoagulants: Secondary | ICD-10-CM

## 2012-02-27 LAB — POCT INR: INR: 3

## 2012-03-04 ENCOUNTER — Telehealth: Payer: Self-pay | Admitting: Family Medicine

## 2012-03-05 NOTE — Telephone Encounter (Signed)
Called and notified via voicemail that papers are ready for pickup  

## 2012-03-11 ENCOUNTER — Telehealth: Payer: Self-pay | Admitting: Family Medicine

## 2012-03-12 ENCOUNTER — Other Ambulatory Visit: Payer: Self-pay | Admitting: Family Medicine

## 2012-03-14 NOTE — Telephone Encounter (Signed)
Concern addressed.  And order refaxed.

## 2012-03-26 ENCOUNTER — Ambulatory Visit (INDEPENDENT_AMBULATORY_CARE_PROVIDER_SITE_OTHER): Payer: Medicare Other | Admitting: *Deleted

## 2012-03-26 DIAGNOSIS — Z7901 Long term (current) use of anticoagulants: Secondary | ICD-10-CM | POA: Diagnosis not present

## 2012-03-26 DIAGNOSIS — I2699 Other pulmonary embolism without acute cor pulmonale: Secondary | ICD-10-CM | POA: Diagnosis not present

## 2012-03-26 LAB — POCT INR: INR: 3.7

## 2012-03-31 ENCOUNTER — Telehealth: Payer: Self-pay | Admitting: Family Medicine

## 2012-03-31 NOTE — Telephone Encounter (Signed)
Referral form for [facility] completed for physical therapy and faxed.

## 2012-03-31 NOTE — Telephone Encounter (Signed)
Need referral form completed and sent to [facility].

## 2012-04-01 DIAGNOSIS — E119 Type 2 diabetes mellitus without complications: Secondary | ICD-10-CM | POA: Diagnosis not present

## 2012-04-01 DIAGNOSIS — I69998 Other sequelae following unspecified cerebrovascular disease: Secondary | ICD-10-CM | POA: Diagnosis not present

## 2012-04-01 DIAGNOSIS — IMO0001 Reserved for inherently not codable concepts without codable children: Secondary | ICD-10-CM | POA: Diagnosis not present

## 2012-04-01 DIAGNOSIS — R269 Unspecified abnormalities of gait and mobility: Secondary | ICD-10-CM | POA: Diagnosis not present

## 2012-04-02 ENCOUNTER — Telehealth: Payer: Self-pay | Admitting: Family Medicine

## 2012-04-02 NOTE — Telephone Encounter (Signed)
pls notify pharmacy and patient's caregiver. Discontinue diabetic tussinex liquid effective 04/03/2012, use standing order robitussin 100mg /ml, also fax over paper to pharmacy that i have signed about this

## 2012-04-04 NOTE — Telephone Encounter (Signed)
Done

## 2012-04-10 DIAGNOSIS — IMO0001 Reserved for inherently not codable concepts without codable children: Secondary | ICD-10-CM | POA: Diagnosis not present

## 2012-04-10 DIAGNOSIS — R269 Unspecified abnormalities of gait and mobility: Secondary | ICD-10-CM | POA: Diagnosis not present

## 2012-04-10 DIAGNOSIS — E119 Type 2 diabetes mellitus without complications: Secondary | ICD-10-CM | POA: Diagnosis not present

## 2012-04-10 DIAGNOSIS — I69998 Other sequelae following unspecified cerebrovascular disease: Secondary | ICD-10-CM | POA: Diagnosis not present

## 2012-04-11 ENCOUNTER — Telehealth: Payer: Self-pay | Admitting: Family Medicine

## 2012-04-11 DIAGNOSIS — IMO0001 Reserved for inherently not codable concepts without codable children: Secondary | ICD-10-CM | POA: Diagnosis not present

## 2012-04-11 DIAGNOSIS — I69998 Other sequelae following unspecified cerebrovascular disease: Secondary | ICD-10-CM | POA: Diagnosis not present

## 2012-04-11 DIAGNOSIS — E119 Type 2 diabetes mellitus without complications: Secondary | ICD-10-CM | POA: Diagnosis not present

## 2012-04-11 DIAGNOSIS — R269 Unspecified abnormalities of gait and mobility: Secondary | ICD-10-CM | POA: Diagnosis not present

## 2012-04-11 NOTE — Telephone Encounter (Signed)
Noted, will need ov if not responding

## 2012-04-14 DIAGNOSIS — IMO0001 Reserved for inherently not codable concepts without codable children: Secondary | ICD-10-CM | POA: Diagnosis not present

## 2012-04-14 DIAGNOSIS — I69998 Other sequelae following unspecified cerebrovascular disease: Secondary | ICD-10-CM | POA: Diagnosis not present

## 2012-04-14 DIAGNOSIS — E119 Type 2 diabetes mellitus without complications: Secondary | ICD-10-CM | POA: Diagnosis not present

## 2012-04-14 DIAGNOSIS — R269 Unspecified abnormalities of gait and mobility: Secondary | ICD-10-CM | POA: Diagnosis not present

## 2012-04-15 DIAGNOSIS — IMO0001 Reserved for inherently not codable concepts without codable children: Secondary | ICD-10-CM | POA: Diagnosis not present

## 2012-04-15 DIAGNOSIS — R269 Unspecified abnormalities of gait and mobility: Secondary | ICD-10-CM | POA: Diagnosis not present

## 2012-04-15 DIAGNOSIS — I69998 Other sequelae following unspecified cerebrovascular disease: Secondary | ICD-10-CM | POA: Diagnosis not present

## 2012-04-15 DIAGNOSIS — E119 Type 2 diabetes mellitus without complications: Secondary | ICD-10-CM | POA: Diagnosis not present

## 2012-04-16 ENCOUNTER — Ambulatory Visit (INDEPENDENT_AMBULATORY_CARE_PROVIDER_SITE_OTHER): Payer: Medicare Other | Admitting: *Deleted

## 2012-04-16 DIAGNOSIS — Z7901 Long term (current) use of anticoagulants: Secondary | ICD-10-CM

## 2012-04-16 DIAGNOSIS — I2699 Other pulmonary embolism without acute cor pulmonale: Secondary | ICD-10-CM | POA: Diagnosis not present

## 2012-04-17 DIAGNOSIS — I69998 Other sequelae following unspecified cerebrovascular disease: Secondary | ICD-10-CM | POA: Diagnosis not present

## 2012-04-17 DIAGNOSIS — R269 Unspecified abnormalities of gait and mobility: Secondary | ICD-10-CM | POA: Diagnosis not present

## 2012-04-17 DIAGNOSIS — E119 Type 2 diabetes mellitus without complications: Secondary | ICD-10-CM | POA: Diagnosis not present

## 2012-04-17 DIAGNOSIS — IMO0001 Reserved for inherently not codable concepts without codable children: Secondary | ICD-10-CM | POA: Diagnosis not present

## 2012-04-22 DIAGNOSIS — B351 Tinea unguium: Secondary | ICD-10-CM | POA: Diagnosis not present

## 2012-04-22 DIAGNOSIS — R269 Unspecified abnormalities of gait and mobility: Secondary | ICD-10-CM | POA: Diagnosis not present

## 2012-04-22 DIAGNOSIS — I69998 Other sequelae following unspecified cerebrovascular disease: Secondary | ICD-10-CM

## 2012-04-22 DIAGNOSIS — E119 Type 2 diabetes mellitus without complications: Secondary | ICD-10-CM

## 2012-04-22 DIAGNOSIS — IMO0001 Reserved for inherently not codable concepts without codable children: Secondary | ICD-10-CM | POA: Diagnosis not present

## 2012-04-22 DIAGNOSIS — E1159 Type 2 diabetes mellitus with other circulatory complications: Secondary | ICD-10-CM | POA: Diagnosis not present

## 2012-04-23 ENCOUNTER — Ambulatory Visit (INDEPENDENT_AMBULATORY_CARE_PROVIDER_SITE_OTHER): Payer: Medicare Other | Admitting: Family Medicine

## 2012-04-23 ENCOUNTER — Encounter: Payer: Self-pay | Admitting: Family Medicine

## 2012-04-23 VITALS — BP 100/70 | HR 66 | Resp 15 | Ht 67.0 in | Wt 208.0 lb

## 2012-04-23 DIAGNOSIS — F329 Major depressive disorder, single episode, unspecified: Secondary | ICD-10-CM

## 2012-04-23 DIAGNOSIS — F32A Depression, unspecified: Secondary | ICD-10-CM

## 2012-04-23 DIAGNOSIS — E785 Hyperlipidemia, unspecified: Secondary | ICD-10-CM

## 2012-04-23 DIAGNOSIS — I679 Cerebrovascular disease, unspecified: Secondary | ICD-10-CM

## 2012-04-23 DIAGNOSIS — E739 Lactose intolerance, unspecified: Secondary | ICD-10-CM

## 2012-04-23 DIAGNOSIS — F17200 Nicotine dependence, unspecified, uncomplicated: Secondary | ICD-10-CM

## 2012-04-23 DIAGNOSIS — R7301 Impaired fasting glucose: Secondary | ICD-10-CM

## 2012-04-23 DIAGNOSIS — I10 Essential (primary) hypertension: Secondary | ICD-10-CM | POA: Diagnosis not present

## 2012-04-23 DIAGNOSIS — F341 Dysthymic disorder: Secondary | ICD-10-CM

## 2012-04-23 NOTE — Patient Instructions (Addendum)
Annual wellness In February, please ask sister to accompany pt (please send a message to facility)  Fasting lipid, cmp HBA1C and TSH, psa early November  Flu vaccine today  Please believe that you are alive for a reason and enjoy life the best you can  No changes in medication at this time  HAPPY BIRTHDAY next week, when it comes!

## 2012-04-24 DIAGNOSIS — E119 Type 2 diabetes mellitus without complications: Secondary | ICD-10-CM | POA: Diagnosis not present

## 2012-04-24 DIAGNOSIS — R269 Unspecified abnormalities of gait and mobility: Secondary | ICD-10-CM | POA: Diagnosis not present

## 2012-04-24 DIAGNOSIS — I69998 Other sequelae following unspecified cerebrovascular disease: Secondary | ICD-10-CM | POA: Diagnosis not present

## 2012-04-24 DIAGNOSIS — IMO0001 Reserved for inherently not codable concepts without codable children: Secondary | ICD-10-CM | POA: Diagnosis not present

## 2012-04-25 DIAGNOSIS — E119 Type 2 diabetes mellitus without complications: Secondary | ICD-10-CM | POA: Diagnosis not present

## 2012-04-25 DIAGNOSIS — I69998 Other sequelae following unspecified cerebrovascular disease: Secondary | ICD-10-CM | POA: Diagnosis not present

## 2012-04-25 DIAGNOSIS — IMO0001 Reserved for inherently not codable concepts without codable children: Secondary | ICD-10-CM | POA: Diagnosis not present

## 2012-04-25 DIAGNOSIS — R269 Unspecified abnormalities of gait and mobility: Secondary | ICD-10-CM | POA: Diagnosis not present

## 2012-04-27 NOTE — Assessment & Plan Note (Signed)
Controlled, no change in medication  

## 2012-04-27 NOTE — Assessment & Plan Note (Signed)
Wheelchair dependent, due to left hemiparesis

## 2012-04-27 NOTE — Assessment & Plan Note (Signed)
Controlled, no change in medication rept labs next visit. Hyperlipidemia:Low fat diet discussed and encouraged.

## 2012-04-27 NOTE — Progress Notes (Signed)
  Subjective:    Patient ID: [name] [name], male    DOB: 07/12/1941, 71 y.o.   MRN: [id number]  HPI The PT is here for follow up and re-evaluation of chronic medical conditions, medication management and review of any available recent lab and radiology data.  . The PT denies any adverse reactions to current medications since the last visit.  C/o increased depression, states does not know why  He is alive, no suicidal plans     Review of Systems See HPI Denies recent fever or chills. Denies sinus pressure, nasal congestion, ear pain or sore throat. Denies chest congestion, productive cough or wheezing. Denies chest pains, palpitations and leg swelling Denies abdominal pain, nausea, vomiting,diarrhea or constipation.   Denies dysuria, frequency, hesitancy or incontinence. Chronic joint pain, and limitation in mobility. Denies headaches  Denies skin break down or rash.        Objective:   Physical Exam  Patient alert  and in no cardiopulmonary distress.Expressive aphasia  HEENT: No facial asymmetry, EOMI, no sinus tenderness,  oropharynx pink and moist.  Neck decreased ROm no adenopathy.  Chest: Clear to auscultation bilaterally.Decreased throughout  CVS: S1, S2 no murmurs, no S3.  ABD: Soft non tender. Bowel sounds normal.  Ext: No edema  MS: decreased  ROM spine, shoulders, hips and knees.  Skin: Intact, no ulcerations or rash noted.  Psych: Good eye contact,blunted  affect. Memory loss depressed appearing.  CNS: CN 2-12 intact, power,sensation diminished on left       Assessment & Plan:

## 2012-04-27 NOTE — Assessment & Plan Note (Signed)
Not well controlled, pt states he has no idea why he is alive. Not actively suicidal or homicidal. Doubt would benefit form therapy, encouraged him to interact more with the people with whom he lives

## 2012-04-27 NOTE — Assessment & Plan Note (Signed)
Unchanged, no commitment to quitting

## 2012-04-28 ENCOUNTER — Telehealth: Payer: Self-pay | Admitting: Family Medicine

## 2012-04-28 DIAGNOSIS — I69998 Other sequelae following unspecified cerebrovascular disease: Secondary | ICD-10-CM | POA: Diagnosis not present

## 2012-04-28 DIAGNOSIS — R269 Unspecified abnormalities of gait and mobility: Secondary | ICD-10-CM | POA: Diagnosis not present

## 2012-04-28 DIAGNOSIS — E119 Type 2 diabetes mellitus without complications: Secondary | ICD-10-CM | POA: Diagnosis not present

## 2012-04-28 DIAGNOSIS — IMO0001 Reserved for inherently not codable concepts without codable children: Secondary | ICD-10-CM | POA: Diagnosis not present

## 2012-04-29 DIAGNOSIS — E119 Type 2 diabetes mellitus without complications: Secondary | ICD-10-CM | POA: Diagnosis not present

## 2012-04-29 DIAGNOSIS — IMO0001 Reserved for inherently not codable concepts without codable children: Secondary | ICD-10-CM | POA: Diagnosis not present

## 2012-04-29 DIAGNOSIS — R269 Unspecified abnormalities of gait and mobility: Secondary | ICD-10-CM | POA: Diagnosis not present

## 2012-04-29 DIAGNOSIS — I69998 Other sequelae following unspecified cerebrovascular disease: Secondary | ICD-10-CM | POA: Diagnosis not present

## 2012-04-30 NOTE — Telephone Encounter (Signed)
We do not manage coumadin labs here. Needs to be addressed with [place]  (If they call back)

## 2012-05-01 DIAGNOSIS — E119 Type 2 diabetes mellitus without complications: Secondary | ICD-10-CM | POA: Diagnosis not present

## 2012-05-01 DIAGNOSIS — I69998 Other sequelae following unspecified cerebrovascular disease: Secondary | ICD-10-CM | POA: Diagnosis not present

## 2012-05-01 DIAGNOSIS — IMO0001 Reserved for inherently not codable concepts without codable children: Secondary | ICD-10-CM | POA: Diagnosis not present

## 2012-05-01 DIAGNOSIS — R269 Unspecified abnormalities of gait and mobility: Secondary | ICD-10-CM | POA: Diagnosis not present

## 2012-05-05 ENCOUNTER — Telehealth: Payer: Self-pay | Admitting: Family Medicine

## 2012-05-06 NOTE — Telephone Encounter (Signed)
Noted  

## 2012-05-06 NOTE — Telephone Encounter (Signed)
Noted, but facility needs to attempt to co ordinate care in such a way that this does not happen, pls remind them of this

## 2012-05-07 ENCOUNTER — Other Ambulatory Visit: Payer: Self-pay

## 2012-05-07 ENCOUNTER — Ambulatory Visit (INDEPENDENT_AMBULATORY_CARE_PROVIDER_SITE_OTHER): Payer: Medicare Other | Admitting: *Deleted

## 2012-05-07 DIAGNOSIS — Z7901 Long term (current) use of anticoagulants: Secondary | ICD-10-CM

## 2012-05-07 DIAGNOSIS — E119 Type 2 diabetes mellitus without complications: Secondary | ICD-10-CM | POA: Diagnosis not present

## 2012-05-07 DIAGNOSIS — R269 Unspecified abnormalities of gait and mobility: Secondary | ICD-10-CM | POA: Diagnosis not present

## 2012-05-07 DIAGNOSIS — I69998 Other sequelae following unspecified cerebrovascular disease: Secondary | ICD-10-CM | POA: Diagnosis not present

## 2012-05-07 DIAGNOSIS — I2699 Other pulmonary embolism without acute cor pulmonale: Secondary | ICD-10-CM | POA: Diagnosis not present

## 2012-05-07 DIAGNOSIS — IMO0001 Reserved for inherently not codable concepts without codable children: Secondary | ICD-10-CM | POA: Diagnosis not present

## 2012-05-07 LAB — POCT INR: INR: 2.7

## 2012-05-07 MED ORDER — ALPRAZOLAM 0.25 MG PO TABS: ORAL_TABLET | ORAL | Status: DC

## 2012-05-12 DIAGNOSIS — N3944 Nocturnal enuresis: Secondary | ICD-10-CM | POA: Diagnosis not present

## 2012-05-12 DIAGNOSIS — R350 Frequency of micturition: Secondary | ICD-10-CM | POA: Diagnosis not present

## 2012-05-12 DIAGNOSIS — N319 Neuromuscular dysfunction of bladder, unspecified: Secondary | ICD-10-CM | POA: Diagnosis not present

## 2012-05-12 DIAGNOSIS — N3020 Other chronic cystitis without hematuria: Secondary | ICD-10-CM | POA: Diagnosis not present

## 2012-05-13 DIAGNOSIS — R269 Unspecified abnormalities of gait and mobility: Secondary | ICD-10-CM | POA: Diagnosis not present

## 2012-05-13 DIAGNOSIS — IMO0001 Reserved for inherently not codable concepts without codable children: Secondary | ICD-10-CM | POA: Diagnosis not present

## 2012-05-13 DIAGNOSIS — I69998 Other sequelae following unspecified cerebrovascular disease: Secondary | ICD-10-CM | POA: Diagnosis not present

## 2012-05-13 DIAGNOSIS — E119 Type 2 diabetes mellitus without complications: Secondary | ICD-10-CM | POA: Diagnosis not present

## 2012-05-15 DIAGNOSIS — R269 Unspecified abnormalities of gait and mobility: Secondary | ICD-10-CM | POA: Diagnosis not present

## 2012-05-15 DIAGNOSIS — I69998 Other sequelae following unspecified cerebrovascular disease: Secondary | ICD-10-CM | POA: Diagnosis not present

## 2012-05-15 DIAGNOSIS — E119 Type 2 diabetes mellitus without complications: Secondary | ICD-10-CM | POA: Diagnosis not present

## 2012-05-15 DIAGNOSIS — IMO0001 Reserved for inherently not codable concepts without codable children: Secondary | ICD-10-CM | POA: Diagnosis not present

## 2012-05-19 DIAGNOSIS — IMO0001 Reserved for inherently not codable concepts without codable children: Secondary | ICD-10-CM | POA: Diagnosis not present

## 2012-05-19 DIAGNOSIS — I69998 Other sequelae following unspecified cerebrovascular disease: Secondary | ICD-10-CM | POA: Diagnosis not present

## 2012-05-19 DIAGNOSIS — E119 Type 2 diabetes mellitus without complications: Secondary | ICD-10-CM | POA: Diagnosis not present

## 2012-05-19 DIAGNOSIS — R269 Unspecified abnormalities of gait and mobility: Secondary | ICD-10-CM | POA: Diagnosis not present

## 2012-05-23 DIAGNOSIS — E119 Type 2 diabetes mellitus without complications: Secondary | ICD-10-CM | POA: Diagnosis not present

## 2012-05-23 DIAGNOSIS — IMO0001 Reserved for inherently not codable concepts without codable children: Secondary | ICD-10-CM | POA: Diagnosis not present

## 2012-05-23 DIAGNOSIS — R269 Unspecified abnormalities of gait and mobility: Secondary | ICD-10-CM | POA: Diagnosis not present

## 2012-05-23 DIAGNOSIS — I69998 Other sequelae following unspecified cerebrovascular disease: Secondary | ICD-10-CM | POA: Diagnosis not present

## 2012-05-26 ENCOUNTER — Telehealth: Payer: Self-pay | Admitting: Family Medicine

## 2012-05-26 DIAGNOSIS — IMO0001 Reserved for inherently not codable concepts without codable children: Secondary | ICD-10-CM | POA: Diagnosis not present

## 2012-05-26 DIAGNOSIS — E119 Type 2 diabetes mellitus without complications: Secondary | ICD-10-CM | POA: Diagnosis not present

## 2012-05-26 DIAGNOSIS — I69998 Other sequelae following unspecified cerebrovascular disease: Secondary | ICD-10-CM | POA: Diagnosis not present

## 2012-05-26 DIAGNOSIS — R269 Unspecified abnormalities of gait and mobility: Secondary | ICD-10-CM | POA: Diagnosis not present

## 2012-05-26 NOTE — Telephone Encounter (Signed)
Wants to get his labwork done tomorrow instead of 1st week in Nov due to transportation issues> advised that would be fine

## 2012-05-27 ENCOUNTER — Other Ambulatory Visit: Payer: Self-pay | Admitting: Family Medicine

## 2012-05-29 DIAGNOSIS — R269 Unspecified abnormalities of gait and mobility: Secondary | ICD-10-CM | POA: Diagnosis not present

## 2012-05-29 DIAGNOSIS — I69998 Other sequelae following unspecified cerebrovascular disease: Secondary | ICD-10-CM | POA: Diagnosis not present

## 2012-05-29 DIAGNOSIS — IMO0001 Reserved for inherently not codable concepts without codable children: Secondary | ICD-10-CM | POA: Diagnosis not present

## 2012-05-29 DIAGNOSIS — E119 Type 2 diabetes mellitus without complications: Secondary | ICD-10-CM | POA: Diagnosis not present

## 2012-06-04 ENCOUNTER — Ambulatory Visit (INDEPENDENT_AMBULATORY_CARE_PROVIDER_SITE_OTHER): Payer: Medicare Other | Admitting: *Deleted

## 2012-06-04 DIAGNOSIS — E739 Lactose intolerance, unspecified: Secondary | ICD-10-CM | POA: Diagnosis not present

## 2012-06-04 DIAGNOSIS — R7301 Impaired fasting glucose: Secondary | ICD-10-CM | POA: Diagnosis not present

## 2012-06-04 DIAGNOSIS — Z7901 Long term (current) use of anticoagulants: Secondary | ICD-10-CM | POA: Diagnosis not present

## 2012-06-04 DIAGNOSIS — I10 Essential (primary) hypertension: Secondary | ICD-10-CM | POA: Diagnosis not present

## 2012-06-04 DIAGNOSIS — E785 Hyperlipidemia, unspecified: Secondary | ICD-10-CM | POA: Diagnosis not present

## 2012-06-04 DIAGNOSIS — I2699 Other pulmonary embolism without acute cor pulmonale: Secondary | ICD-10-CM | POA: Diagnosis not present

## 2012-06-04 LAB — COMPREHENSIVE METABOLIC PANEL
AST: 10 U/L (ref 0–37)
Albumin: 4.1 g/dL (ref 3.5–5.2)
BUN: 27 mg/dL — ABNORMAL HIGH (ref 6–23)
CO2: 27 mEq/L (ref 19–32)
Calcium: 9.5 mg/dL (ref 8.4–10.5)
Chloride: 104 mEq/L (ref 96–112)
Glucose, Bld: 76 mg/dL (ref 70–99)
Potassium: 5.1 mEq/L (ref 3.5–5.3)

## 2012-06-04 LAB — LIPID PANEL
Cholesterol: 166 mg/dL (ref 0–200)
Triglycerides: 115 mg/dL (ref <150)

## 2012-06-04 LAB — HEMOGLOBIN A1C: Mean Plasma Glucose: 117 mg/dL — ABNORMAL HIGH (ref <117)

## 2012-07-01 DIAGNOSIS — E1159 Type 2 diabetes mellitus with other circulatory complications: Secondary | ICD-10-CM | POA: Diagnosis not present

## 2012-07-01 DIAGNOSIS — B351 Tinea unguium: Secondary | ICD-10-CM | POA: Diagnosis not present

## 2012-07-01 DIAGNOSIS — E119 Type 2 diabetes mellitus without complications: Secondary | ICD-10-CM | POA: Diagnosis not present

## 2012-07-07 ENCOUNTER — Encounter (INDEPENDENT_AMBULATORY_CARE_PROVIDER_SITE_OTHER): Payer: Medicare Other

## 2012-07-07 DIAGNOSIS — Z7901 Long term (current) use of anticoagulants: Secondary | ICD-10-CM

## 2012-07-09 ENCOUNTER — Other Ambulatory Visit: Payer: Self-pay | Admitting: Family Medicine

## 2012-07-15 ENCOUNTER — Other Ambulatory Visit: Payer: Self-pay | Admitting: Family Medicine

## 2012-07-19 ENCOUNTER — Encounter: Payer: Self-pay | Admitting: Cardiology

## 2012-07-24 ENCOUNTER — Ambulatory Visit (INDEPENDENT_AMBULATORY_CARE_PROVIDER_SITE_OTHER): Payer: Medicare Other | Admitting: *Deleted

## 2012-07-24 DIAGNOSIS — Z7901 Long term (current) use of anticoagulants: Secondary | ICD-10-CM | POA: Diagnosis not present

## 2012-07-24 DIAGNOSIS — I2699 Other pulmonary embolism without acute cor pulmonale: Secondary | ICD-10-CM | POA: Diagnosis not present

## 2012-07-24 LAB — POCT INR: INR: 3.2

## 2012-07-28 ENCOUNTER — Encounter: Payer: Self-pay | Admitting: Family Medicine

## 2012-07-28 ENCOUNTER — Ambulatory Visit (INDEPENDENT_AMBULATORY_CARE_PROVIDER_SITE_OTHER): Payer: Medicare Other | Admitting: Family Medicine

## 2012-07-28 VITALS — BP 126/78 | HR 66 | Resp 18 | Ht 67.0 in | Wt 196.1 lb

## 2012-07-28 DIAGNOSIS — J309 Allergic rhinitis, unspecified: Secondary | ICD-10-CM

## 2012-07-28 DIAGNOSIS — F32A Depression, unspecified: Secondary | ICD-10-CM

## 2012-07-28 DIAGNOSIS — F341 Dysthymic disorder: Secondary | ICD-10-CM

## 2012-07-28 DIAGNOSIS — I10 Essential (primary) hypertension: Secondary | ICD-10-CM | POA: Diagnosis not present

## 2012-07-28 DIAGNOSIS — K219 Gastro-esophageal reflux disease without esophagitis: Secondary | ICD-10-CM

## 2012-07-28 DIAGNOSIS — R7301 Impaired fasting glucose: Secondary | ICD-10-CM

## 2012-07-28 DIAGNOSIS — E739 Lactose intolerance, unspecified: Secondary | ICD-10-CM

## 2012-07-28 DIAGNOSIS — E785 Hyperlipidemia, unspecified: Secondary | ICD-10-CM

## 2012-07-28 DIAGNOSIS — R32 Unspecified urinary incontinence: Secondary | ICD-10-CM | POA: Diagnosis not present

## 2012-07-28 DIAGNOSIS — I2699 Other pulmonary embolism without acute cor pulmonale: Secondary | ICD-10-CM

## 2012-07-28 DIAGNOSIS — F17200 Nicotine dependence, unspecified, uncomplicated: Secondary | ICD-10-CM

## 2012-07-28 DIAGNOSIS — F329 Major depressive disorder, single episode, unspecified: Secondary | ICD-10-CM

## 2012-07-28 NOTE — Progress Notes (Signed)
  Subjective:    Patient ID: [name] [name], male    DOB: February 03, 1941, 71 y.o.   MRN: [id number]  HPI The PT is here for follow up and re-evaluation of chronic medical conditions, medication management and review of any available recent lab and radiology data.  Preventive health is updated, specifically  Cancer screening and Immunization.    The PT denies any adverse reactions to current medications since the last visit.  There are no new concerns.  There are no specific complaints       Review of Systems See HPI Denies recent fever or chills. Denies sinus pressure, nasal congestion, ear pain or sore throat. Denies chest congestion, productive cough or wheezing. Denies chest pains, palpitations and leg swelling Denies abdominal pain, nausea, vomiting,diarrhea or constipation.   Denies dysuria, frequency, hesitancy or incontinence. Chronic  limitation in mobility related to past CVA, wheelchair dependent. Denies headaches, seizures, numbness, or tingling. Denies uncontrolled  depression, anxiety or insomnia. Denies skin break down or rash.        Objective:   Physical Exam Patient alert and oriented and in no cardiopulmonary distress.  HEENT: No facial asymmetry, EOMI, no sinus tenderness,  oropharynx pink and moist.  Neck decreased ROM no adenopathy.  Chest: Clear to auscultation bilaterally.decreased air entry  CVS: S1, S2 no murmurs, no S3.  ABD: Soft non tender. Bowel sounds normal.  Ext: No edema  MS: decreased ROM spine, shoulders, hips and knees.  Skin: Intact, no ulcerations or rash noted.  Psych: Good eye contact, normal affect.  not anxious or depressed appearing.  CNS: CN 2-12 intact,left hemiparesis unchanged      Assessment & Plan:

## 2012-07-28 NOTE — Patient Instructions (Addendum)
F/u in early March, call if you need me before  Discontinue due to non use tramadol, meclizine, dimenhydrinate, alprazolam and alcohol pads effective 07/28/2012.  Fasting lipid, cmp and EGFr, hBA1C in March before visit  Please check with pharmacy for zostavax, pt needs this, best to get at the pharmacy if covered.

## 2012-08-13 NOTE — Assessment & Plan Note (Signed)
Controlled, continue modified diet due to difficulty swallowing

## 2012-08-13 NOTE — Assessment & Plan Note (Signed)
Controlled, no change in medication  

## 2012-08-13 NOTE — Assessment & Plan Note (Signed)
Unchanged, followed by urology also

## 2012-08-13 NOTE — Assessment & Plan Note (Signed)
Pt maintained on chronic coumadin due to high risk of recurrence as he is immobile. Followed by coumadin clinic

## 2012-08-13 NOTE — Assessment & Plan Note (Signed)
Improving with low carb diet, retest HBa1C every 6 month

## 2012-08-13 NOTE — Assessment & Plan Note (Signed)
Unchanged, no commitment to quitting Patient counseled for approximately 5 minutes regarding the health risks of ongoing nicotine use, specifically all types of cancer, heart disease, stroke and respiratory failure. The options available for help with cessation ,the behavioral changes to assist the process, and the option to either gradully reduce usage  Or abruptly stop.is also discussed. Pt is also encouraged to set specific goals in number of cigarettes used daily, as well as to set a quit date.

## 2012-08-20 ENCOUNTER — Other Ambulatory Visit: Payer: Self-pay | Admitting: Family Medicine

## 2012-08-21 ENCOUNTER — Ambulatory Visit (INDEPENDENT_AMBULATORY_CARE_PROVIDER_SITE_OTHER): Payer: Medicare Other | Admitting: *Deleted

## 2012-08-21 DIAGNOSIS — I2699 Other pulmonary embolism without acute cor pulmonale: Secondary | ICD-10-CM

## 2012-08-21 DIAGNOSIS — Z7901 Long term (current) use of anticoagulants: Secondary | ICD-10-CM | POA: Diagnosis not present

## 2012-08-21 LAB — POCT INR: INR: 3.4

## 2012-09-01 ENCOUNTER — Telehealth: Payer: Self-pay | Admitting: Family Medicine

## 2012-09-01 NOTE — Telephone Encounter (Signed)
We have the zoster vaccine and will administer at his appt tomorrow

## 2012-09-02 ENCOUNTER — Encounter: Payer: Self-pay | Admitting: *Deleted

## 2012-09-02 ENCOUNTER — Ambulatory Visit (INDEPENDENT_AMBULATORY_CARE_PROVIDER_SITE_OTHER): Payer: Medicare Other | Admitting: Family Medicine

## 2012-09-02 ENCOUNTER — Ambulatory Visit (INDEPENDENT_AMBULATORY_CARE_PROVIDER_SITE_OTHER): Payer: Medicare Other | Admitting: Cardiology

## 2012-09-02 ENCOUNTER — Encounter: Payer: Self-pay | Admitting: Cardiology

## 2012-09-02 ENCOUNTER — Encounter: Payer: Self-pay | Admitting: Family Medicine

## 2012-09-02 VITALS — BP 116/72 | HR 56 | Ht 69.0 in | Wt 207.0 lb

## 2012-09-02 VITALS — BP 126/72 | HR 60 | Resp 18 | Ht 67.0 in | Wt 207.0 lb

## 2012-09-02 DIAGNOSIS — R5381 Other malaise: Secondary | ICD-10-CM

## 2012-09-02 DIAGNOSIS — R5383 Other fatigue: Secondary | ICD-10-CM

## 2012-09-02 DIAGNOSIS — Z7901 Long term (current) use of anticoagulants: Secondary | ICD-10-CM

## 2012-09-02 DIAGNOSIS — E785 Hyperlipidemia, unspecified: Secondary | ICD-10-CM

## 2012-09-02 DIAGNOSIS — I679 Cerebrovascular disease, unspecified: Secondary | ICD-10-CM

## 2012-09-02 DIAGNOSIS — R7301 Impaired fasting glucose: Secondary | ICD-10-CM | POA: Insufficient documentation

## 2012-09-02 DIAGNOSIS — I10 Essential (primary) hypertension: Secondary | ICD-10-CM

## 2012-09-02 DIAGNOSIS — R269 Unspecified abnormalities of gait and mobility: Secondary | ICD-10-CM

## 2012-09-02 DIAGNOSIS — Z2911 Encounter for prophylactic immunotherapy for respiratory syncytial virus (RSV): Secondary | ICD-10-CM

## 2012-09-02 DIAGNOSIS — R2681 Unsteadiness on feet: Secondary | ICD-10-CM

## 2012-09-02 DIAGNOSIS — R531 Weakness: Secondary | ICD-10-CM

## 2012-09-02 DIAGNOSIS — Z8673 Personal history of transient ischemic attack (TIA), and cerebral infarction without residual deficits: Secondary | ICD-10-CM

## 2012-09-02 MED ORDER — WARFARIN SODIUM 4 MG PO TABS: 4.0000 mg | ORAL_TABLET | ORAL | Status: DC

## 2012-09-02 NOTE — Assessment & Plan Note (Signed)
No elevated blood pressures have been recorded for the past few years. Current therapy is efficacious and will be continued.

## 2012-09-02 NOTE — Patient Instructions (Addendum)
Your physician recommends that you schedule a follow-up appointment in: 1 year  Your physician recommends that you return for lab work in: Today  Stools x 3 and return to office asap

## 2012-09-02 NOTE — Progress Notes (Signed)
  Subjective:    Patient ID: [name] [name], male    DOB: [birth year], 72 y.o.   MRN: [id number]  HPI Pt here for annual wellness without a family member which is inappropriate . Visit rescheduled. Pt has no complaints.  His zostavax is in the office and he has received this today. A request was made for PT to eval and do strengthening this has been ordered   Review of Systems     Objective:   Physical Exam        Assessment & Plan:

## 2012-09-02 NOTE — Assessment & Plan Note (Signed)
Adequate control of hyperlipidemia in the absence of known vascular disease.

## 2012-09-02 NOTE — Progress Notes (Deleted)
Name: [name] [name]    DOB: Jul 15, 1941  Age: 71 y.o.  MR#: [id number]       PCP:  Syliva Overman, MD      Insurance: @PAYORNAME @   CC:   No chief complaint on file.  MEDICATION LIST LIPIDS,CMET,A1C TO BE DONE BY DR SIMPSON SOON STOOLS NEGATIVE X 3 08/16/2011  VS BP 116/72  Pulse 56  Ht 5\' 9"  (1.753 m)  Wt 207 lb (93.895 kg)  BMI 30.57 kg/m2  Weights Current Weight  09/02/12 207 lb (93.895 kg)  07/28/12 196 lb 1.3 oz (88.941 kg)  04/23/12 208 lb (94.348 kg)    Blood Pressure  BP Readings from Last 3 Encounters:  09/02/12 116/72  07/28/12 126/78  04/23/12 100/70     Admit date:  (Not on file) Last encounter with RMR:  07/19/2012   Allergy No Known Allergies  Current Outpatient Prescriptions  Medication Sig Dispense Refill  . CELEXA 40 MG tablet TAKE ONE TABLET BY MOUTH ONCE DAILY.  30 each  3  . COLACE 100 MG capsule TAKE 2 CAPSULES BY MOUTH TWICE DAILY.  120 each  1  . COREG 25 MG tablet TAKE 1 TABLET BY MOUTH   TWICE DAILY WITH MEALS.  60 each  1  . FLOMAX 0.4 MG CAPS TAKE 1 CAPSULE BY MOUTH ONCE DAILY.  30 capsule  0  . GNP ANTACID ANTI-GAS 200-200-20 MG/5ML suspension TAKE 2 TABLESPOONFULS (30CC) BY MOUTH AS NEEDED FOR HEART BURN OR INDIGESTION. MAX 4 DOSES / 24HRS.  355 mL  PRN  . IMDUR 30 MG 24 hr tablet TAKE ONE TABLET BY MOUTH ONCE DAILY.  30 each  0  . IMODIUM A-D 2 MG tablet TAKE 1 TABLET BY MOUTH   AFTER EACH LOOSE STOOL ASNEEDED FOR DIARRHEA. NO  MORE THAN 6/24 HOURS.  24 each  0  . LASIX 40 MG tablet TAKE ONE TABLET BY MOUTH ONCE DAILY.  30 each  0  . lisinopril (PRINIVIL,ZESTRIL) 20 MG tablet Take 20 mg by mouth daily.      . meclizine (ANTIVERT) 25 MG tablet Take 25 mg by mouth 2 (two) times daily as needed.      Marland Kitchen Neomycin-Bacitracin-Polymyxin (NEOSPORIN ORIGINAL) [phone] OINT APPLY TO MINOR SKIN TEARSAS NEEDED.  15 each  0  . PRAVACHOL 40 MG tablet TAKE ONE TABLET BY MOUTH AT BEDTIME.  30 each  3  . ROBITUSSIN CHEST CONGESTION 100 MG/5ML syrup TAKE  2 TEASPOONSFUL (10 CC'S) BY MOUTH EVERY 6 HOURS AS NEEDED FOR COUGH.  120 Bottle  0  . sodium phosphate (FLEET) 7-19 GM/118ML ENEM USE 1 ENEMA RECTALLY EVERY 3 DAYS IF NO BOWEL MOVEMENT.  1 enema  PRN  . Starch-Maltodextrin (THICK-IT PO) Take by mouth. Use as directed       . TRAVEL SICKNESS 50 MG tablet TAKE 1 TABLET BY MOUTH EVERY 4 HOURS AS NEEDED FOR NAUSEA OR VOMITING.  30 tablet  PRN  . trimethoprim (TRIMPEX) 100 MG tablet Take 1 tablet (100 mg total) by mouth daily.  30 tablet    . TYLENOL 325 MG tablet TAKE (1) TABLET BY MOUTH (3) TIMES DAILY.  90 each  0  . warfarin (COUMADIN) 4 MG tablet Take 1 tablet (4 mg total) by mouth as directed.  90 tablet  0   Current Facility-Administered Medications  Medication Dose Route Frequency Provider Last Rate Last Dose  . Influenza (>/= 3 years) inactive virus vaccine (FLVIRIN/FLUZONE) injection SUSP 0.5 mL  0.5 mL Intramuscular  Once Kerri Perches, MD        Discontinued Meds:    Medications Discontinued During This Encounter  Medication Reason  . sulfamethoxazole-trimethoprim (BACTRIM DS) 800-160 MG per tablet Error  . solifenacin (VESICARE) 10 MG tablet Error  . pantoprazole (PROTONIX) 40 MG tablet Error  . FLONASE 50 MCG/ACT nasal spray Error  . magnesium hydroxide (PHILLIPS MILK OF MAGNESIA) 400 MG/5ML suspension Error    Patient Active Problem List  Diagnosis  . IMPAIRED GLUCOSE TOLERANCE  . Hyperlipidemia  . Tobacco dependence  . Hypertension  . Chronic anticoagulation  . Pulmonary embolism  . Cerebrovascular disease  . Gastroesophageal reflux disease  . Degenerative joint disease  . Anxiety and depression  . Substance abuse  . Thrombocytopenia  . Allergic rhinitis  . Obesity  . Urinary incontinence    LABS Anti-coag visit on 08/21/2012  Component Date Value  . INR 08/21/2012 3.4   Appointment on 07/07/2012  Component Date Value  . INR 07/24/2012 3.2   Anti-coag visit on 06/04/2012  Component Date Value  . INR  06/04/2012 2.5      Results for this Opt Visit:     Results for orders placed in visit on 08/21/12  POCT INR      Component Value Range   INR 3.4      EKG Orders placed in visit on 09/02/12  . EKG 12-LEAD     Prior Assessment and Plan Problem List as of 09/02/2012            Cardiology Problems   Hyperlipidemia   Last Assessment & Plan Note   04/23/2012 Office Visit Signed 04/27/2012  1:17 PM by Kerri Perches, MD    Controlled, no change in medication rept labs next visit. Hyperlipidemia:Low fat diet discussed and encouraged.      Hypertension   Last Assessment & Plan Note   07/28/2012 Office Visit Signed 08/13/2012  6:35 AM by Kerri Perches, MD    Controlled, no change in medication     Pulmonary embolism   Last Assessment & Plan Note   07/28/2012 Office Visit Signed 08/13/2012  6:37 AM by Kerri Perches, MD    Pt maintained on chronic coumadin due to high risk of recurrence as he is immobile. Followed by coumadin clinic    Cerebrovascular disease   Last Assessment & Plan Note   04/23/2012 Office Visit Signed 04/27/2012  1:17 PM by Kerri Perches, MD    Wheelchair dependent, due to left hemiparesis      Other   IMPAIRED GLUCOSE TOLERANCE   Last Assessment & Plan Note   07/28/2012 Office Visit Signed 08/13/2012  6:39 AM by Kerri Perches, MD    Improving with low carb diet, retest HBa1C every 6 month    Tobacco dependence   Last Assessment & Plan Note   07/28/2012 Office Visit Signed 08/13/2012  6:41 AM by Kerri Perches, MD    Unchanged, no commitment to quitting Patient counseled for approximately 5 minutes regarding the health risks of ongoing nicotine use, specifically all types of cancer, heart disease, stroke and respiratory failure. The options available for help with cessation ,the behavioral changes to assist the process, and the option to either gradully reduce usage  Or abruptly stop.is also discussed. Pt is also encouraged to set  specific goals in number of cigarettes used daily, as well as to set a quit date.     Chronic anticoagulation   Last Assessment & Plan  Note   08/08/2011 Office Visit Signed 08/12/2011 12:20 PM by Kathlen Brunswick, MD    CBC and stool for Hemoccult testing will be monitored to exclude occult GI blood loss.    Gastroesophageal reflux disease   Last Assessment & Plan Note   07/28/2012 Office Visit Signed 08/13/2012  6:40 AM by Kerri Perches, MD    Controlled, continue modified diet due to difficulty swallowing    Degenerative joint disease   Anxiety and depression   Last Assessment & Plan Note   07/28/2012 Office Visit Signed 08/13/2012  6:41 AM by Kerri Perches, MD    Controlled, no change in medication     Substance abuse   Thrombocytopenia   Allergic rhinitis   Last Assessment & Plan Note   07/28/2012 Office Visit Signed 08/13/2012  6:37 AM by Kerri Perches, MD    Controlled, no change in medication     Obesity   Last Assessment & Plan Note   12/06/2011 Office Visit Signed 12/08/2011  2:37 PM by Kerri Perches, MD    Deteriorated. Patient re-educated about  the importance of commitment to a  minimum of 150 minutes of exercise per week. The importance of healthy food choices with portion control discussed. Encouraged to start a food diary, count calories and to consider  joining a support group. Sample diet sheets offered. Goals set by the patient for the next several months.       Urinary incontinence   Last Assessment & Plan Note   07/28/2012 Office Visit Signed 08/13/2012  6:41 AM by Kerri Perches, MD    Unchanged, followed by urology also        Imaging: No results found.   FRS Calculation: Score not calculated. Missing: Total Cholesterol

## 2012-09-02 NOTE — Progress Notes (Signed)
Patient ID: [name] [name], male   DOB: 12-Nov-1940, 72 y.o.   MRN: [id number]  HPI: Scheduled annual return visit for this very nice gentleman who has been maintained on warfarin as the result of recurrent DVT/pulmonary embolism in 2004. Over the past year, he has developed no new medical problems nor required urgent medical care. There is been no evidence for overt or occult GI blood loss. Dosing of warfarin has been somewhat variable, and it is somewhat difficult for him to return frequently to anticoagulation clinic.  Prior to Admission medications   Medication Sig Start Date End Date Taking? Authorizing Provider  CELEXA 40 MG tablet TAKE ONE TABLET BY MOUTH ONCE DAILY. 12/28/10  Yes Kerri Perches, MD  COLACE 100 MG capsule TAKE 2 CAPSULES BY MOUTH TWICE DAILY. 02/02/11  Yes Kerri Perches, MD  COREG 25 MG tablet TAKE 1 TABLET BY MOUTH   TWICE DAILY WITH MEALS. 02/02/11  Yes Kerri Perches, MD  FLOMAX 0.4 MG CAPS TAKE 1 CAPSULE BY MOUTH ONCE DAILY. 07/25/11  Yes Kerri Perches, MD  GNP ANTACID ANTI-GAS 200-200-20 MG/5ML suspension TAKE 2 TABLESPOONFULS (30CC) BY MOUTH AS NEEDED FOR HEART BURN OR INDIGESTION. MAX 4 DOSES / 24HRS. 07/09/12  Yes Kerri Perches, MD  IMDUR 30 MG 24 hr tablet TAKE ONE TABLET BY MOUTH ONCE DAILY. 12/01/10  Yes Kerri Perches, MD  IMODIUM A-D 2 MG tablet TAKE 1 TABLET BY MOUTH   AFTER EACH LOOSE STOOL ASNEEDED FOR DIARRHEA. NO  MORE THAN 6/24 HOURS. 10/25/10  Yes Kerri Perches, MD  LASIX 40 MG tablet TAKE ONE TABLET BY MOUTH ONCE DAILY. 12/01/10  Yes Kerri Perches, MD  lisinopril (PRINIVIL,ZESTRIL) 20 MG tablet Take 20 mg by mouth daily. 03/07/11  Yes Kerri Perches, MD  meclizine (ANTIVERT) 25 MG tablet Take 25 mg by mouth 2 (two) times daily as needed.   Yes Historical Provider, MD  Neomycin-Bacitracin-Polymyxin (NEOSPORIN ORIGINAL) [phone] OINT APPLY TO MINOR SKIN TEARSAS NEEDED. 10/25/10  Yes Kerri Perches, MD  PRAVACHOL 40 MG  tablet TAKE ONE TABLET BY MOUTH AT BEDTIME. 12/28/10  Yes Kerri Perches, MD  ROBITUSSIN CHEST CONGESTION 100 MG/5ML syrup TAKE 2 TEASPOONSFUL (10 CC'S) BY MOUTH EVERY 6 HOURS AS NEEDED FOR COUGH. 11/22/11  Yes Kerri Perches, MD  sodium phosphate (FLEET) 7-19 GM/118ML ENEM USE 1 ENEMA RECTALLY EVERY 3 DAYS IF NO BOWEL MOVEMENT. 05/27/12  Yes Kerri Perches, MD  Starch-Maltodextrin (THICK-IT PO) Take by mouth. Use as directed    Yes Historical Provider, MD  TRAVEL SICKNESS 50 MG tablet TAKE 1 TABLET BY MOUTH EVERY 4 HOURS AS NEEDED FOR NAUSEA OR VOMITING. 08/20/12  Yes Kerri Perches, MD  trimethoprim (TRIMPEX) 100 MG tablet Take 1 tablet (100 mg total) by mouth daily. 07/28/12  Yes Kerri Perches, MD  TYLENOL 325 MG tablet TAKE (1) TABLET BY MOUTH (3) TIMES DAILY. 12/01/10  Yes Kerri Perches, MD  warfarin (COUMADIN) 4 MG tablet Take 1 tablet (4 mg total) by mouth as directed. 07/25/11  Yes Kathlen Brunswick, MD  No Known Allergies    Past medical history, social history, and family history reviewed and updated.  ROS: Denies chest pain, dyspnea, orthopnea or PND. He has mild intermittent pedal edema. He experiences no nausea, emesis nor has he had any change in bowel habit. Colonoscopy was performed within the past 2 years.  All other systems reviewed and are negative  PHYSICAL EXAM:  BP 116/72  Pulse 56  Ht 5\' 9"  (1.753 m)  Wt 93.895 kg (207 lb)  BMI 30.57 kg/m2  General-Well developed; no acute distress Body habitus-mildly overweight Neck-No JVD; no carotid bruits; normal carotid upstrokes Lungs-clear lung fields; resonant to percussion Cardiovascular-normal PMI; normal S1 and S2 Abdomen-normal bowel sounds; soft and non-tender without masses or organomegaly Musculoskeletal-No deformities, no cyanosis or clubbing Neurologic-Normal cranial nerves; symmetric strength and tone Skin-Warm, no significant lesions Extremities-distal pulses intact; trace edema  EKG: sinus  bradycardia; technical difficulties; borderline low-voltage; nonspecific ST-T wave abnormality.  ASSESSMENT AND PLAN:  [name] Bing, MD 09/02/2012 1:24 PM

## 2012-09-02 NOTE — Assessment & Plan Note (Signed)
To simplify therapy, treatment with warfarin can be changed to rivaroxaban. Most recent creatinine clearance was 43 cc/min.  A repeat chemistry profile will be obtained and the drug started if creatinine is stable or improved.

## 2012-09-03 ENCOUNTER — Telehealth: Payer: Self-pay | Admitting: Cardiology

## 2012-09-03 NOTE — Telephone Encounter (Signed)
Needs clarification on new Warfarin dose / tgs

## 2012-09-03 NOTE — Addendum Note (Signed)
Addended by: Kandis Fantasia B on: 09/03/2012 09:56 AM   Modules accepted: Orders

## 2012-09-03 NOTE — Telephone Encounter (Signed)
Coumadin dose of 4mg  daily except 2mg  on Wednesdays and Saturdays verified with Marlene Bast at [facility] from [facility].  Pt scheduled for INR check on 09/11/12

## 2012-09-10 ENCOUNTER — Ambulatory Visit (HOSPITAL_COMMUNITY)
Admission: RE | Admit: 2012-09-10 | Discharge: 2012-09-10 | Disposition: A | Discharge status: Home / Self Care | Payer: Medicare Other | Source: Ambulatory Visit | Attending: Family Medicine | Admitting: Family Medicine

## 2012-09-10 DIAGNOSIS — IMO0001 Reserved for inherently not codable concepts without codable children: Secondary | ICD-10-CM | POA: Diagnosis not present

## 2012-09-10 DIAGNOSIS — I10 Essential (primary) hypertension: Secondary | ICD-10-CM | POA: Diagnosis not present

## 2012-09-10 DIAGNOSIS — M6281 Muscle weakness (generalized): Secondary | ICD-10-CM | POA: Insufficient documentation

## 2012-09-10 DIAGNOSIS — R269 Unspecified abnormalities of gait and mobility: Secondary | ICD-10-CM | POA: Diagnosis not present
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
## 2012-09-11 ENCOUNTER — Ambulatory Visit (INDEPENDENT_AMBULATORY_CARE_PROVIDER_SITE_OTHER): Payer: Medicare Other | Admitting: *Deleted

## 2012-09-11 DIAGNOSIS — I2699 Other pulmonary embolism without acute cor pulmonale: Secondary | ICD-10-CM | POA: Diagnosis not present

## 2012-09-11 DIAGNOSIS — Z7901 Long term (current) use of anticoagulants: Secondary | ICD-10-CM

## 2012-09-11 LAB — POCT INR: INR: 2.4

## 2012-09-15 ENCOUNTER — Telehealth: Payer: Self-pay | Admitting: Family Medicine

## 2012-09-16 ENCOUNTER — Ambulatory Visit (HOSPITAL_COMMUNITY)
Admission: RE | Admit: 2012-09-16 | Discharge: 2012-09-16 | Disposition: A | Discharge status: Home / Self Care | Payer: Medicare Other | Source: Ambulatory Visit | Attending: Family Medicine | Admitting: Family Medicine

## 2012-09-16 DIAGNOSIS — R269 Unspecified abnormalities of gait and mobility: Secondary | ICD-10-CM | POA: Diagnosis not present

## 2012-09-16 DIAGNOSIS — I10 Essential (primary) hypertension: Secondary | ICD-10-CM | POA: Diagnosis not present

## 2012-09-16 DIAGNOSIS — IMO0001 Reserved for inherently not codable concepts without codable children: Secondary | ICD-10-CM | POA: Diagnosis not present

## 2012-09-16 DIAGNOSIS — M6281 Muscle weakness (generalized): Secondary | ICD-10-CM | POA: Diagnosis not present

## 2012-09-16 NOTE — Telephone Encounter (Signed)
Order faxed.

## 2012-09-17 NOTE — Progress Notes (Signed)
Physical Therapy Treatment Patient Details  Name: [name] [name] MRN: [id number] Date of Birth: Jan 01, 1941  Today's Date: 09/17/2012 Time: 1500-1540 PT Time Calculation (min): 40 min  Visit#: 2 of 8  Re-eval: 10/10/12  charge:  There activity x 28; Bt x 15  Authorization: medicare   Authorization Visit#: 2 of 8   Subjective: Pt states that he has been standing at his facility    Precautions/Restrictions falls    Exercise/Treatments Mobility/Balance  Transfers Sit to Stand: 3: Mod assist Sit to Stand Details (indicate cue type and reason): Pt keeps COG behind BOS; Pt worked on static standing balance x 18 min.   Stand to Sit: 4: Min guard Stand Pivot Transfers: 3: Mod assist Ambulation/Gait Ambulation/Gait: Yes Ambulation/Gait Assistance: 4: Min assist Ambulation/Gait Assistance Details: Pt needed to take two rest breaks during ambulation Ambulation Distance (Feet): 60 Feet Assistive device: Rolling walker Gait Pattern: Decreased step length - left;Decreased stance time - right;Trunk flexed Gait velocity: slow     Standing Heel Raises: 10 reps Functional Squat: 10 reps Other Standing Knee Exercises: marching in place x 10  Seated Long Arc Quad: 10 reps;Weights Long Arc Quad Weight: 4 lbs. Other Seated Knee Exercises: sit to stand x 5 on each leg   Physical Therapy Assessment and Plan PT Assessment and Plan Clinical Impression Statement: Pt fatigued at end of session but happy with self as he was able to walk.  Pt has poor standing static balance without UE assist but was able to hold self for a few seconds with verbal cuing at the end of session. PT Plan: Pt to work on balance and gait with rolling walker.  Begin with static standing balance activity and progress to dynamic as able.        Problem List Patient Active Problem List  Diagnosis  . IMPAIRED GLUCOSE TOLERANCE  . Hyperlipidemia  . Tobacco dependence  . Hypertension  . Chronic anticoagulation  .  Pulmonary embolism  . Cerebrovascular disease  . Gastroesophageal reflux disease  . Degenerative joint disease  . Anxiety and depression  . Substance abuse  . Thrombocytopenia  . Allergic rhinitis  . Obesity  . Urinary incontinence  . Chronic kidney disease, stage 3  . Fasting hyperglycemia    PT - End of Session Equipment Utilized During Treatment: Gait belt Activity Tolerance: Patient tolerated treatment well General Behavior During Session: [facility] for tasks performed Cognition: [facility] for tasks performed PT Plan of Care PT Home Exercise Plan: given( pt already doring strengthening ex that were given to him months ago)  GP    Micheale Schlack,CINDY 09/17/2012, 10:08 AM

## 2012-09-18 ENCOUNTER — Ambulatory Visit (HOSPITAL_COMMUNITY): Payer: Medicare Other

## 2012-09-23 ENCOUNTER — Ambulatory Visit (HOSPITAL_COMMUNITY): Payer: Medicare Other | Admitting: *Deleted

## 2012-09-23 DIAGNOSIS — E119 Type 2 diabetes mellitus without complications: Secondary | ICD-10-CM | POA: Diagnosis not present

## 2012-09-23 DIAGNOSIS — B351 Tinea unguium: Secondary | ICD-10-CM | POA: Diagnosis not present

## 2012-09-23 DIAGNOSIS — E1159 Type 2 diabetes mellitus with other circulatory complications: Secondary | ICD-10-CM | POA: Diagnosis not present

## 2012-09-25 ENCOUNTER — Ambulatory Visit (HOSPITAL_COMMUNITY)
Admission: RE | Admit: 2012-09-25 | Discharge: 2012-09-25 | Disposition: A | Discharge status: Home / Self Care | Payer: Medicare Other | Source: Ambulatory Visit | Attending: Family Medicine | Admitting: Family Medicine

## 2012-09-25 DIAGNOSIS — M6281 Muscle weakness (generalized): Secondary | ICD-10-CM | POA: Diagnosis not present

## 2012-09-25 DIAGNOSIS — R269 Unspecified abnormalities of gait and mobility: Secondary | ICD-10-CM | POA: Diagnosis not present

## 2012-09-25 DIAGNOSIS — IMO0001 Reserved for inherently not codable concepts without codable children: Secondary | ICD-10-CM | POA: Diagnosis not present

## 2012-09-25 DIAGNOSIS — I10 Essential (primary) hypertension: Secondary | ICD-10-CM | POA: Diagnosis not present

## 2012-09-25 NOTE — Progress Notes (Signed)
Physical Therapy Treatment Patient Details  Name: [name] [name] MRN: [id number] Date of Birth: 1941/01/18  Today's Date: 09/25/2012 Time: 7846-9629 PT Time Calculation (min): 52 min  Visit#: 3 of 8  Re-eval: 10/10/12 Authorization: medicare  Authorization Visit#: 3 of 8 Charges:  therex 20', gait 25'   Subjective: Symptoms/Limitations Symptoms: Pt. states he's been walking a little bit at his facility.  Reports no pain today. Pain Assessment Currently in Pain?: No/denies   Exercise/Treatments Standing Heel Raises: 10 reps Forward Step Up: 10 reps;Limitations Forward Step Up Limitations: 4" toe tapping, alternating Functional Squat: 10 reps Gait Training: 40'X2, 60'X1 with RW and min-mod assist Other Standing Knee Exercises: marching in place x 10  Other Standing Knee Exercises: static stand tolerance without UE assist 30" max Seated Long Arc Quad: 10 reps;Weights Long Arc Quad Weight: 4 lbs. Other Seated Knee Exercises: sit to stand x 10 Other Seated Knee Exercises: hip flexion 10 reps each with 4#    Physical Therapy Assessment and Plan PT Assessment and Plan Clinical Impression Statement: Pt. requires constant VC's with ambulation to keep head up, increase stride and increase BOS.  Pt. with extreme tightness in R hip rotators causing his R LE to remain in near full ER and pushing heels into one another with ambulation.  Pt constantly steps on opposite heel with ambulation.  PROM stretch done for R hip rotators in seated position.  Able to maintain static standing posture without UE support for max of 30" today. PT Plan: Pt to work on balance and gait with rolling walker.  Begin with static standing balance activity and progress to dynamic as able.     Problem List Patient Active Problem List  Diagnosis  . IMPAIRED GLUCOSE TOLERANCE  . Hyperlipidemia  . Tobacco dependence  . Hypertension  . Chronic anticoagulation  . Pulmonary embolism  . Cerebrovascular disease   . Gastroesophageal reflux disease  . Degenerative joint disease  . Anxiety and depression  . Substance abuse  . Thrombocytopenia  . Allergic rhinitis  . Obesity  . Urinary incontinence  . Chronic kidney disease, stage 3  . Fasting hyperglycemia       Lurena Nida, PTA/CLT 09/25/2012, 4:42 PM

## 2012-09-30 ENCOUNTER — Telehealth: Payer: Self-pay | Admitting: Family Medicine

## 2012-09-30 ENCOUNTER — Ambulatory Visit (HOSPITAL_COMMUNITY)
Admission: RE | Admit: 2012-09-30 | Discharge: 2012-09-30 | Disposition: A | Discharge status: Home / Self Care | Payer: Medicare Other | Source: Ambulatory Visit | Attending: Family Medicine | Admitting: Family Medicine

## 2012-09-30 DIAGNOSIS — R269 Unspecified abnormalities of gait and mobility: Secondary | ICD-10-CM | POA: Diagnosis not present

## 2012-09-30 DIAGNOSIS — M6281 Muscle weakness (generalized): Secondary | ICD-10-CM | POA: Diagnosis not present

## 2012-09-30 DIAGNOSIS — I10 Essential (primary) hypertension: Secondary | ICD-10-CM | POA: Diagnosis not present

## 2012-09-30 DIAGNOSIS — IMO0001 Reserved for inherently not codable concepts without codable children: Secondary | ICD-10-CM | POA: Diagnosis not present

## 2012-09-30 NOTE — Telephone Encounter (Signed)
Leda Gauze requesting that I prescribe his once daily trimethaprin as well as write an order for this. This med is prescribed by his urologist , so both need to come from the treating physician. I am leaving the papers sent in your area also Order for gait belt entered and signed, pls fax and let her know Thanks

## 2012-09-30 NOTE — Progress Notes (Signed)
Physical Therapy Treatment Patient Details  Name: [name] [name] MRN: [id number] Date of Birth: January 24, 1941  Today's Date: 09/30/2012 Time: 1445-1530 PT Time Calculation (min): 45 min  Visit#: 4 of 8  Re-eval: 10/10/12 Charges: Gait x 15' Theract x 18' NMR x 8'  Authorization: medicare  Authorization Visit#: 4 of 8   Subjective: Symptoms/Limitations Symptoms: Pt states that it is difficult to keep hips in neutral because he has broken both legs in the past. Pain Assessment Currently in Pain?: No/denies   Exercise/Treatments Standing Gait Training: 40' x 1 with RW and min-mod assist Other Standing Knee Exercises: static stand tolerance without UE assist 30" max Seated Other Seated Knee Exercises: Marching with TC's to avoid ER; Hip abd 10x5" Other Seated Knee Exercises: STS x 5 with mod-max assist for proper form  Physical Therapy Assessment and Plan PT Assessment and Plan Clinical Impression Statement: Tx focus on static balance, gait and functional strength. Pt tends to try to stand straight up rather than leaning upper body forward. Sit to stand with mod-max assist completed multiple time to improve form with this activity. Began abductor and internal rotator strengthening this session. Pt reports no increase in pain at end of session. PT Plan: Pt to work on balance and gait with rolling walker.  Begin with static standing balance activity and progress to dynamic as able.     Problem List Patient Active Problem List  Diagnosis  . IMPAIRED GLUCOSE TOLERANCE  . Hyperlipidemia  . Tobacco dependence  . Hypertension  . Chronic anticoagulation  . Pulmonary embolism  . Cerebrovascular disease  . Gastroesophageal reflux disease  . Degenerative joint disease  . Anxiety and depression  . Substance abuse  . Thrombocytopenia  . Allergic rhinitis  . Obesity  . Urinary incontinence  . Chronic kidney disease, stage 3  . Fasting hyperglycemia    PT - End of  Session Equipment Utilized During Treatment: Gait belt Activity Tolerance: Patient tolerated treatment well General Behavior During Session: [facility] for tasks performed Cognition: [facility] for tasks performed  Seth Bake, PTA 09/30/2012, 4:01 PM

## 2012-10-02 ENCOUNTER — Ambulatory Visit (HOSPITAL_COMMUNITY)
Admission: RE | Admit: 2012-10-02 | Discharge: 2012-10-02 | Disposition: A | Discharge status: Home / Self Care | Payer: Medicare Other | Source: Ambulatory Visit | Attending: Family Medicine | Admitting: Family Medicine

## 2012-10-02 DIAGNOSIS — R269 Unspecified abnormalities of gait and mobility: Secondary | ICD-10-CM | POA: Diagnosis not present

## 2012-10-02 DIAGNOSIS — I10 Essential (primary) hypertension: Secondary | ICD-10-CM | POA: Diagnosis not present

## 2012-10-02 DIAGNOSIS — M6281 Muscle weakness (generalized): Secondary | ICD-10-CM | POA: Diagnosis not present

## 2012-10-02 DIAGNOSIS — IMO0001 Reserved for inherently not codable concepts without codable children: Secondary | ICD-10-CM | POA: Diagnosis not present

## 2012-10-02 NOTE — Progress Notes (Signed)
Physical Therapy Treatment Patient Details  Name: [name] [name] MRN: [id number] Date of Birth: Sep 08, 1940  Today's Date: 10/02/2012 Time: 4098-1191 PT Time Calculation (min): 46 min Visit#: 5 of 8  Re-eval: 10/10/12 Authorization: medicare  Authorization Visit#: 5 of 8  Charges:  Gait 24', therex 16'  Subjective: Symptoms/Limitations Symptoms: Pt. reports no pain today, however displays pain at times with general mobility. Pain Assessment Currently in Pain?: No/denies   Exercise/Treatments Standing Gait Training: 40' x 3  with RW and min-mod assist Other Standing Knee Exercises: static stand tolerance without UE assist 30" max Seated Other Seated Knee Exercises: Marching with TC's to avoid ER; Hip abd 10x5" Other Seated Knee Exercises: STS x 5 with mod-max assist for proper form    Physical Therapy Assessment and Plan PT Assessment and Plan Clinical Impression Statement: Pt. able to ambulate 3 bouts of 40 feet today using RW.  Pt. c/o R knee pain towards end of ambulation.  Pt. continues to require constant cues to increase stride and BOS with ambulation.  Pt. with improving safety awareness with transfers. PT Plan: Pt to work on balance and gait with rolling walker.  Begin with static standing balance activity and progress to dynamic as able.     Problem List Patient Active Problem List  Diagnosis  . IMPAIRED GLUCOSE TOLERANCE  . Hyperlipidemia  . Tobacco dependence  . Hypertension  . Chronic anticoagulation  . Pulmonary embolism  . Cerebrovascular disease  . Gastroesophageal reflux disease  . Degenerative joint disease  . Anxiety and depression  . Substance abuse  . Thrombocytopenia  . Allergic rhinitis  . Obesity  . Urinary incontinence  . Chronic kidney disease, stage 3  . Fasting hyperglycemia    PT - End of Session Equipment Utilized During Treatment: Gait belt Activity Tolerance: Patient tolerated treatment well General Behavior During Session: [facility]  for tasks performed Cognition: [facility] for tasks performed   Lurena Nida, PTA/CLT 10/02/2012, 5:52 PM

## 2012-10-03 ENCOUNTER — Other Ambulatory Visit: Payer: Self-pay

## 2012-10-03 MED ORDER — SOLIFENACIN SUCCINATE 10 MG PO TABS: 10.0000 mg | ORAL_TABLET | Freq: Every day | ORAL | Status: DC

## 2012-10-07 ENCOUNTER — Ambulatory Visit (HOSPITAL_COMMUNITY)
Admission: RE | Admit: 2012-10-07 | Discharge: 2012-10-07 | Disposition: A | Discharge status: Home / Self Care | Payer: Medicare Other | Source: Ambulatory Visit | Attending: Family Medicine | Admitting: Family Medicine

## 2012-10-07 DIAGNOSIS — IMO0001 Reserved for inherently not codable concepts without codable children: Secondary | ICD-10-CM | POA: Diagnosis not present

## 2012-10-07 DIAGNOSIS — R269 Unspecified abnormalities of gait and mobility: Secondary | ICD-10-CM | POA: Diagnosis not present

## 2012-10-07 DIAGNOSIS — M6281 Muscle weakness (generalized): Secondary | ICD-10-CM | POA: Insufficient documentation

## 2012-10-07 DIAGNOSIS — I10 Essential (primary) hypertension: Secondary | ICD-10-CM | POA: Insufficient documentation

## 2012-10-07 NOTE — Progress Notes (Signed)
Physical Therapy Treatment Patient Details  Name: [name] [name] MRN: [id number] Date of Birth: Dec 29, 1940  Today's Date: 10/07/2012 Time: 5621-3086 PT Time Calculation (min): 52 min  Visit#: 6 of 8  Re-eval: 10/10/12 Diagnosis: difficulty walking Next MD Visit: 10/31/2012 Authorization: medicare  Authorization Visit#: 6 of 8  Charges:  therex 25', gait 18'  Subjective: Symptoms/Limitations Symptoms: Pt. states he's moving a little better.  States he has no pain today.  Precautions/Restrictions  Precautions Precautions: Fall Precaution Comments: nectar thickened liquids  Exercise/Treatments Standing Gait Training: 40' x 2  with RW and min-mod assist Other Standing Knee Exercises: static stand tolerance without UE assist 30" max Seated Long Arc Quad: 2 sets;10 reps Long Arc Quad Weight: 5 lbs. Other Seated Knee Exercises: Marching with TC's to avoid ER Other Seated Knee Exercises: STS x 5 without UE from elevated surface (bottom of mat 21" from floor)     Physical Therapy Assessment and Plan PT Assessment and Plan Clinical Impression Statement: No complaints of knee pain during session today.  Static standing of 2 minutes completed with use of RW. Increased mat surface to allow sit to stand without use of UE's to help strengthen his LE's.  Pt. voiced he feels he is moving better, however his legs feel weak.  Increased LAQ to 5# today without difficulty.l PT Plan: Pt to work on balance and gait with rolling walker.  Continue to improve LE strength.  Re-evaluate next visit (Pt. Missed one appt.).   Problem List Patient Active Problem List  Diagnosis  . IMPAIRED GLUCOSE TOLERANCE  . Hyperlipidemia  . Tobacco dependence  . Hypertension  . Chronic anticoagulation  . Pulmonary embolism  . Cerebrovascular disease  . Gastroesophageal reflux disease  . Degenerative joint disease  . Anxiety and depression  . Substance abuse  . Thrombocytopenia  . Allergic rhinitis  .  Obesity  . Urinary incontinence  . Chronic kidney disease, stage 3  . Fasting hyperglycemia    PT - End of Session Equipment Utilized During Treatment: Gait belt Activity Tolerance: Patient tolerated treatment well General Behavior During Session: [facility] for tasks performed Cognition: [facility] for tasks performed   Lurena Nida, PTA/CLT 10/07/2012, 3:50 PM

## 2012-10-09 ENCOUNTER — Ambulatory Visit (INDEPENDENT_AMBULATORY_CARE_PROVIDER_SITE_OTHER): Payer: Medicare Other | Admitting: *Deleted

## 2012-10-09 ENCOUNTER — Ambulatory Visit (HOSPITAL_COMMUNITY)
Admission: RE | Admit: 2012-10-09 | Discharge: 2012-10-09 | Disposition: A | Discharge status: Home / Self Care | Payer: Medicare Other | Source: Ambulatory Visit | Attending: Family Medicine | Admitting: Family Medicine

## 2012-10-09 DIAGNOSIS — I10 Essential (primary) hypertension: Secondary | ICD-10-CM | POA: Diagnosis not present

## 2012-10-09 DIAGNOSIS — Z7901 Long term (current) use of anticoagulants: Secondary | ICD-10-CM

## 2012-10-09 DIAGNOSIS — R269 Unspecified abnormalities of gait and mobility: Secondary | ICD-10-CM | POA: Diagnosis not present

## 2012-10-09 DIAGNOSIS — I2699 Other pulmonary embolism without acute cor pulmonale: Secondary | ICD-10-CM

## 2012-10-09 DIAGNOSIS — IMO0001 Reserved for inherently not codable concepts without codable children: Secondary | ICD-10-CM | POA: Diagnosis not present

## 2012-10-09 DIAGNOSIS — M6281 Muscle weakness (generalized): Secondary | ICD-10-CM | POA: Diagnosis not present

## 2012-10-09 LAB — POCT INR: INR: 2.5
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
## 2012-10-14 DIAGNOSIS — E739 Lactose intolerance, unspecified: Secondary | ICD-10-CM | POA: Diagnosis not present

## 2012-10-14 DIAGNOSIS — I10 Essential (primary) hypertension: Secondary | ICD-10-CM | POA: Diagnosis not present

## 2012-10-14 DIAGNOSIS — E785 Hyperlipidemia, unspecified: Secondary | ICD-10-CM | POA: Diagnosis not present

## 2012-10-14 DIAGNOSIS — R32 Unspecified urinary incontinence: Secondary | ICD-10-CM | POA: Diagnosis not present

## 2012-10-14 DIAGNOSIS — R7301 Impaired fasting glucose: Secondary | ICD-10-CM | POA: Diagnosis not present

## 2012-10-15 LAB — HEMOGLOBIN A1C
Hgb A1c MFr Bld: 6 % — ABNORMAL HIGH (ref <5.7)
Mean Plasma Glucose: 126 mg/dL — ABNORMAL HIGH (ref <117)

## 2012-10-15 LAB — LIPID PANEL
Cholesterol: 134 mg/dL (ref 0–200)
HDL: 39 mg/dL — ABNORMAL LOW (ref >39)
LDL Cholesterol: 72 mg/dL (ref 0–99)
Triglycerides: 116 mg/dL (ref <150)

## 2012-10-15 LAB — COMPLETE METABOLIC PANEL WITH GFR
BUN: 18 mg/dL (ref 6–23)
CO2: 23 mEq/L (ref 19–32)
Creat: 1.31 mg/dL (ref 0.50–1.35)
GFR, Est African American: 63 mL/min
GFR, Est Non African American: 54 mL/min — ABNORMAL LOW
Glucose, Bld: 82 mg/dL (ref 70–99)
Total Bilirubin: 0.5 mg/dL (ref 0.3–1.2)

## 2012-11-06 ENCOUNTER — Ambulatory Visit: Payer: Medicare Other | Admitting: Family Medicine

## 2012-11-06 ENCOUNTER — Ambulatory Visit (INDEPENDENT_AMBULATORY_CARE_PROVIDER_SITE_OTHER): Payer: Medicare Other | Admitting: *Deleted

## 2012-11-06 DIAGNOSIS — Z7901 Long term (current) use of anticoagulants: Secondary | ICD-10-CM

## 2012-11-06 DIAGNOSIS — I2699 Other pulmonary embolism without acute cor pulmonale: Secondary | ICD-10-CM | POA: Diagnosis not present

## 2012-11-10 ENCOUNTER — Ambulatory Visit: Payer: Medicare Other | Admitting: Family Medicine

## 2012-11-19 ENCOUNTER — Ambulatory Visit (INDEPENDENT_AMBULATORY_CARE_PROVIDER_SITE_OTHER): Payer: Medicare Other | Admitting: Family Medicine

## 2012-11-19 ENCOUNTER — Encounter: Payer: Self-pay | Admitting: Family Medicine

## 2012-11-19 VITALS — BP 130/84 | HR 62 | Resp 16 | Ht 67.0 in | Wt 204.1 lb

## 2012-11-19 DIAGNOSIS — R7301 Impaired fasting glucose: Secondary | ICD-10-CM | POA: Diagnosis not present

## 2012-11-19 DIAGNOSIS — E739 Lactose intolerance, unspecified: Secondary | ICD-10-CM

## 2012-11-19 DIAGNOSIS — F341 Dysthymic disorder: Secondary | ICD-10-CM

## 2012-11-19 DIAGNOSIS — F329 Major depressive disorder, single episode, unspecified: Secondary | ICD-10-CM

## 2012-11-19 DIAGNOSIS — F17200 Nicotine dependence, unspecified, uncomplicated: Secondary | ICD-10-CM | POA: Diagnosis not present

## 2012-11-19 DIAGNOSIS — Z125 Encounter for screening for malignant neoplasm of prostate: Secondary | ICD-10-CM

## 2012-11-19 DIAGNOSIS — E785 Hyperlipidemia, unspecified: Secondary | ICD-10-CM

## 2012-11-19 DIAGNOSIS — I10 Essential (primary) hypertension: Secondary | ICD-10-CM | POA: Diagnosis not present

## 2012-11-19 NOTE — Progress Notes (Signed)
  Subjective:    Patient ID: [name] [name], male    DOB: [birth year], 72 y.o.   MRN: [id number]  HPI The PT is here for follow up and re-evaluation of chronic medical conditions, medication management and review of any available recent lab and radiology data.  Preventive health is updated, specifically  Cancer screening and Immunization.   Questions or concerns regarding consultations or procedures which the PT has had in the interim are  addressed. The PT denies any adverse reactions to current medications since the last visit.  There are no new concerns.  There are no specific complaints       Review of Systems See HPI Denies recent fever or chills. Denies sinus pressure, nasal congestion, ear pain or sore throat. Denies chest congestion, productive cough or wheezing. Denies chest pains, palpitations and leg swelling Denies abdominal pain, nausea, vomiting,diarrhea or constipation.   Denies dysuria, frequency, hesitancy or incontinence.  Denies headaches, seizures, numbness, or tingling. Denies uncontrolled  depression, anxiety or insomnia. Denies skin break down or rash.        Objective:   Physical Exam Patient alert and oriented and in no cardiopulmonary distress.Wheelchair dependent  HEENT: No facial asymmetry, EOMI, no sinus tenderness,  oropharynx pink and moist.  Neck supple no adenopathy.  Chest: Clear to auscultation bilaterally.Decreased though adequate air entry  CVS: S1, S2 no murmurs, no S3.  ABD: Soft non tender. Bowel sounds normal.  Ext: No edema  MS: decreased  ROM spine, shoulders, hips and knees.  Skin: Intact, no ulcerations or rash noted.  Psych: Good eye contact, normal affect. Memory impaired  not anxious or depressed appearing.  CNS: CN 2-12 intact,         Assessment & Plan:

## 2012-11-19 NOTE — Assessment & Plan Note (Signed)
Hyperlipidemia:Low fat diet discussed and encouraged.  Controlled, no change in medication   

## 2012-11-19 NOTE — Assessment & Plan Note (Signed)
Controlled, no change in medication  

## 2012-11-19 NOTE — Assessment & Plan Note (Signed)
Deteriorated, hBA1C is 6.0 in March Patient educated about the importance of limiting  Carbohydrate intake , the need to commit to daily physical activity for a minimum of 30 minutes , and to commit weight loss. The fact that changes in all these areas will reduce or eliminate all together the development of diabetes is stressed.

## 2012-11-19 NOTE — Patient Instructions (Addendum)
Annual wellness in 4.5 month  Continue to reduce cigarettes by 1 cigarette per month, so 4 cigarettes per day  By the time you get back  Non fasting PSA and hBA1C in 4.5 month, before next visit  Labwork is excellent, also blood pressure, no med changes

## 2012-11-19 NOTE — Assessment & Plan Note (Signed)
Cutting back, now at 7 per day , willing to reduce by 1 cigarette per mont, encouraged to continue doing this Patient counseled for approximately 5 minutes regarding the health risks of ongoing nicotine use, specifically all types of cancer, heart disease, stroke and respiratory failure. The options available for help with cessation ,the behavioral changes to assist the process, and the option to either gradully reduce usage  Or abruptly stop.is also discussed. Pt is also encouraged to set specific goals in number of cigarettes used daily, as well as to set a quit date.

## 2012-12-01 ENCOUNTER — Other Ambulatory Visit: Payer: Self-pay | Admitting: Cardiology

## 2012-12-01 DIAGNOSIS — I679 Cerebrovascular disease, unspecified: Secondary | ICD-10-CM | POA: Diagnosis not present

## 2012-12-01 LAB — BASIC METABOLIC PANEL
CO2: 24 mEq/L (ref 19–32)
Calcium: 9.2 mg/dL (ref 8.4–10.5)
Chloride: 106 mEq/L (ref 96–112)
Glucose, Bld: 70 mg/dL (ref 70–99)
Potassium: 4.7 mEq/L (ref 3.5–5.3)
Sodium: 138 mEq/L (ref 135–145)

## 2012-12-01 LAB — CBC
Hemoglobin: 14.9 g/dL (ref 13.0–17.0)
MCH: 32.5 pg (ref 26.0–34.0)
Platelets: 116 10*3/uL — ABNORMAL LOW (ref 150–400)
RBC: 4.59 MIL/uL (ref 4.22–5.81)
WBC: 6.3 10*3/uL (ref 4.0–10.5)

## 2012-12-02 ENCOUNTER — Encounter: Payer: Self-pay | Admitting: *Deleted

## 2012-12-02 ENCOUNTER — Encounter: Payer: Self-pay | Admitting: Cardiology

## 2012-12-04 ENCOUNTER — Ambulatory Visit (INDEPENDENT_AMBULATORY_CARE_PROVIDER_SITE_OTHER): Payer: Medicare Other | Admitting: *Deleted

## 2012-12-04 DIAGNOSIS — Z7901 Long term (current) use of anticoagulants: Secondary | ICD-10-CM

## 2012-12-04 DIAGNOSIS — I2699 Other pulmonary embolism without acute cor pulmonale: Secondary | ICD-10-CM | POA: Diagnosis not present

## 2012-12-22 ENCOUNTER — Telehealth: Payer: Self-pay | Admitting: Family Medicine

## 2012-12-23 ENCOUNTER — Other Ambulatory Visit: Payer: Self-pay | Admitting: Family Medicine

## 2012-12-23 DIAGNOSIS — R7301 Impaired fasting glucose: Secondary | ICD-10-CM | POA: Diagnosis not present

## 2012-12-23 DIAGNOSIS — Z125 Encounter for screening for malignant neoplasm of prostate: Secondary | ICD-10-CM | POA: Diagnosis not present

## 2012-12-23 DIAGNOSIS — E739 Lactose intolerance, unspecified: Secondary | ICD-10-CM | POA: Diagnosis not present

## 2012-12-23 LAB — HEMOGLOBIN A1C
Hgb A1c MFr Bld: 5.6 % (ref <5.7)
Mean Plasma Glucose: 114 mg/dL (ref <117)

## 2012-12-23 LAB — PSA, MEDICARE: PSA: 3.74 ng/mL (ref <=4.00)

## 2012-12-23 MED ORDER — NYSTATIN 100000 UNIT/GM EX CREA: TOPICAL_CREAM | Freq: Two times a day (BID) | CUTANEOUS | Status: DC

## 2012-12-23 MED ORDER — CLOTRIMAZOLE-BETAMETHASONE 1-0.05 % EX CREA: TOPICAL_CREAM | Freq: Two times a day (BID) | CUTANEOUS | Status: AC

## 2012-12-23 NOTE — Telephone Encounter (Signed)
Nystatin and clotrimazole/betameth have been prescribed, pls let facility know

## 2012-12-23 NOTE — Telephone Encounter (Signed)
Needs an order for cream  because he's having some irritation around his bottom   [phone] fax rx sent to [facility]

## 2012-12-24 ENCOUNTER — Telehealth: Payer: Self-pay | Admitting: Family Medicine

## 2012-12-24 NOTE — Telephone Encounter (Signed)
For his bottom, its irritated

## 2012-12-24 NOTE — Telephone Encounter (Signed)
Facility aware.

## 2012-12-31 ENCOUNTER — Encounter: Payer: Medicare Other | Admitting: Family Medicine

## 2012-12-31 MED ORDER — STARCH-MALTODEXTRIN PO POWD: ORAL | Status: DC

## 2012-12-31 NOTE — Telephone Encounter (Signed)
Pt has had standing orders for skin which have had to be discontinued due to non use, will wait on call for flare, then re fill, pls let her know

## 2012-12-31 NOTE — Telephone Encounter (Signed)
Standing order sent to [facility]

## 2012-12-31 NOTE — Telephone Encounter (Signed)
Was needing thick it powder to be a standing order. Ok to write on a letterhead and fax?

## 2012-12-31 NOTE — Telephone Encounter (Signed)
Yes pls do, you can stamp my sig, I appreciate

## 2013-01-15 ENCOUNTER — Ambulatory Visit (INDEPENDENT_AMBULATORY_CARE_PROVIDER_SITE_OTHER): Payer: Medicare Other | Admitting: *Deleted

## 2013-01-15 DIAGNOSIS — Z7901 Long term (current) use of anticoagulants: Secondary | ICD-10-CM

## 2013-01-15 DIAGNOSIS — I2699 Other pulmonary embolism without acute cor pulmonale: Secondary | ICD-10-CM | POA: Diagnosis not present

## 2013-01-26 DIAGNOSIS — N3000 Acute cystitis without hematuria: Secondary | ICD-10-CM | POA: Diagnosis not present

## 2013-01-26 DIAGNOSIS — N3020 Other chronic cystitis without hematuria: Secondary | ICD-10-CM | POA: Diagnosis not present

## 2013-02-18 ENCOUNTER — Telehealth: Payer: Self-pay | Admitting: Family Medicine

## 2013-02-22 NOTE — Telephone Encounter (Signed)
fl2 received from group home for review and signature.

## 2013-02-23 DIAGNOSIS — R350 Frequency of micturition: Secondary | ICD-10-CM | POA: Diagnosis not present

## 2013-02-23 DIAGNOSIS — N3941 Urge incontinence: Secondary | ICD-10-CM | POA: Diagnosis not present

## 2013-02-23 DIAGNOSIS — N319 Neuromuscular dysfunction of bladder, unspecified: Secondary | ICD-10-CM | POA: Diagnosis not present

## 2013-02-26 ENCOUNTER — Ambulatory Visit (INDEPENDENT_AMBULATORY_CARE_PROVIDER_SITE_OTHER): Payer: Medicare Other | Admitting: *Deleted

## 2013-02-26 DIAGNOSIS — Z7901 Long term (current) use of anticoagulants: Secondary | ICD-10-CM | POA: Diagnosis not present

## 2013-02-26 DIAGNOSIS — I2699 Other pulmonary embolism without acute cor pulmonale: Secondary | ICD-10-CM

## 2013-02-26 LAB — POCT INR: INR: 3

## 2013-03-02 DIAGNOSIS — N3941 Urge incontinence: Secondary | ICD-10-CM | POA: Diagnosis not present

## 2013-03-06 ENCOUNTER — Other Ambulatory Visit: Payer: Self-pay | Admitting: *Deleted

## 2013-03-06 DIAGNOSIS — I679 Cerebrovascular disease, unspecified: Secondary | ICD-10-CM

## 2013-03-09 DIAGNOSIS — R350 Frequency of micturition: Secondary | ICD-10-CM | POA: Diagnosis not present

## 2013-03-09 DIAGNOSIS — N3941 Urge incontinence: Secondary | ICD-10-CM | POA: Diagnosis not present

## 2013-03-16 ENCOUNTER — Other Ambulatory Visit: Payer: Self-pay | Admitting: Cardiology

## 2013-03-16 DIAGNOSIS — I679 Cerebrovascular disease, unspecified: Secondary | ICD-10-CM | POA: Diagnosis not present

## 2013-03-16 LAB — BASIC METABOLIC PANEL
BUN: 19 mg/dL (ref 6–23)
Calcium: 9.5 mg/dL (ref 8.4–10.5)
Creat: 1.11 mg/dL (ref 0.50–1.35)
Glucose, Bld: 80 mg/dL (ref 70–99)
Potassium: 4.6 mEq/L (ref 3.5–5.3)

## 2013-03-16 LAB — CBC
MCV: 92.1 fL (ref 78.0–100.0)
Platelets: 102 10*3/uL — ABNORMAL LOW (ref 150–400)
RBC: 4.67 MIL/uL (ref 4.22–5.81)
RDW: 13.9 % (ref 11.5–15.5)
WBC: 4.5 10*3/uL (ref 4.0–10.5)

## 2013-03-23 DIAGNOSIS — N3941 Urge incontinence: Secondary | ICD-10-CM | POA: Diagnosis not present

## 2013-03-24 ENCOUNTER — Encounter: Payer: Self-pay | Admitting: *Deleted

## 2013-03-24 ENCOUNTER — Encounter: Payer: Self-pay | Admitting: Cardiology

## 2013-03-30 DIAGNOSIS — R350 Frequency of micturition: Secondary | ICD-10-CM | POA: Diagnosis not present

## 2013-03-30 DIAGNOSIS — N3941 Urge incontinence: Secondary | ICD-10-CM | POA: Diagnosis not present

## 2013-04-07 ENCOUNTER — Other Ambulatory Visit: Payer: Self-pay | Admitting: Family Medicine

## 2013-04-08 ENCOUNTER — Encounter: Payer: Self-pay | Admitting: Cardiovascular Disease

## 2013-04-08 ENCOUNTER — Ambulatory Visit (INDEPENDENT_AMBULATORY_CARE_PROVIDER_SITE_OTHER): Payer: Medicare Other | Admitting: Cardiovascular Disease

## 2013-04-08 ENCOUNTER — Ambulatory Visit (INDEPENDENT_AMBULATORY_CARE_PROVIDER_SITE_OTHER): Payer: Medicare Other | Admitting: *Deleted

## 2013-04-08 VITALS — BP 100/61 | HR 57 | Ht 69.0 in | Wt 202.8 lb

## 2013-04-08 DIAGNOSIS — I10 Essential (primary) hypertension: Secondary | ICD-10-CM

## 2013-04-08 DIAGNOSIS — I2510 Atherosclerotic heart disease of native coronary artery without angina pectoris: Secondary | ICD-10-CM

## 2013-04-08 DIAGNOSIS — I255 Ischemic cardiomyopathy: Secondary | ICD-10-CM

## 2013-04-08 DIAGNOSIS — I2699 Other pulmonary embolism without acute cor pulmonale: Secondary | ICD-10-CM

## 2013-04-08 DIAGNOSIS — Z7901 Long term (current) use of anticoagulants: Secondary | ICD-10-CM | POA: Diagnosis not present

## 2013-04-08 DIAGNOSIS — I2589 Other forms of chronic ischemic heart disease: Secondary | ICD-10-CM

## 2013-04-08 DIAGNOSIS — E785 Hyperlipidemia, unspecified: Secondary | ICD-10-CM

## 2013-04-08 DIAGNOSIS — I82409 Acute embolism and thrombosis of unspecified deep veins of unspecified lower extremity: Secondary | ICD-10-CM

## 2013-04-08 LAB — POCT INR: INR: 2.3

## 2013-04-08 MED ORDER — RIVAROXABAN 20 MG PO TABS: 20.0000 mg | ORAL_TABLET | Freq: Every day | ORAL | Status: DC
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
## 2013-04-08 NOTE — Patient Instructions (Addendum)
Your physician recommends that you schedule a follow-up appointment in: ONE YEAR/ONE MONTH WITH LISA REID  Your physician has recommended you make the following change in your medication:   1) STOP TAKING COUMADIN/WARFARIN 2) START TAKING XARELTO 20MG  ONCE DAILY

## 2013-04-13 DIAGNOSIS — R351 Nocturia: Secondary | ICD-10-CM | POA: Diagnosis not present

## 2013-04-13 DIAGNOSIS — N3941 Urge incontinence: Secondary | ICD-10-CM | POA: Diagnosis not present

## 2013-04-13 DIAGNOSIS — R350 Frequency of micturition: Secondary | ICD-10-CM | POA: Diagnosis not present

## 2013-04-14 ENCOUNTER — Telehealth: Payer: Self-pay | Admitting: Family Medicine

## 2013-04-14 NOTE — Telephone Encounter (Signed)
Called and left message.

## 2013-04-16 NOTE — Telephone Encounter (Signed)
Spoke with Mr [name] [name] regarding questions about medications

## 2013-04-20 DIAGNOSIS — R350 Frequency of micturition: Secondary | ICD-10-CM | POA: Diagnosis not present

## 2013-04-20 DIAGNOSIS — N3941 Urge incontinence: Secondary | ICD-10-CM | POA: Diagnosis not present

## 2013-04-22 ENCOUNTER — Other Ambulatory Visit: Payer: Self-pay | Admitting: Family Medicine

## 2013-04-24 ENCOUNTER — Other Ambulatory Visit: Payer: Self-pay

## 2013-04-24 ENCOUNTER — Telehealth: Payer: Self-pay | Admitting: Family Medicine

## 2013-04-24 NOTE — Telephone Encounter (Signed)
Called pharmacy and faxed over orders to them and to [facility]

## 2013-04-24 NOTE — Telephone Encounter (Signed)
Pls contact the pharmacy and the facility he lives at on telephone as well as to fax in the following orders D/c flonase, d/c protonix, d/c nystatin effective 04/24/2013 and  d/c trimethoprin, effective 6/23 and d/c coumadin effective 04/08/2013 Start keflex 250mg  one at bedtime effective 02/02/2013 Start xarelto 20mg  QHS 04/08/2013 Let pharmacy and facility know new dose of tylenol 325mg  one twice daily, as needed, for pain or fever over 100

## 2013-04-27 DIAGNOSIS — R350 Frequency of micturition: Secondary | ICD-10-CM | POA: Diagnosis not present

## 2013-04-27 DIAGNOSIS — N3941 Urge incontinence: Secondary | ICD-10-CM | POA: Diagnosis not present

## 2013-04-30 ENCOUNTER — Encounter: Payer: Self-pay | Admitting: Family Medicine

## 2013-04-30 ENCOUNTER — Ambulatory Visit (INDEPENDENT_AMBULATORY_CARE_PROVIDER_SITE_OTHER): Payer: Medicare Other | Admitting: Family Medicine

## 2013-04-30 VITALS — BP 100/62 | HR 58 | Resp 16 | Wt 206.4 lb

## 2013-04-30 DIAGNOSIS — Z0000 Encounter for general adult medical examination without abnormal findings: Secondary | ICD-10-CM

## 2013-04-30 DIAGNOSIS — Z23 Encounter for immunization: Secondary | ICD-10-CM

## 2013-04-30 MED ORDER — NICOTINE 7 MG/24HR TD PT24: 1.0000 | MEDICATED_PATCH | TRANSDERMAL | Status: AC

## 2013-04-30 MED ORDER — NICOTINE 14 MG/24HR TD PT24: 1.0000 | MEDICATED_PATCH | TRANSDERMAL | Status: DC

## 2013-04-30 NOTE — Patient Instructions (Addendum)
F/u in 4 month.. Flu vaccine today   May eat ice cream 3 ounces twice weekly   Nicoderm patch for help with smoking cessation will be sent in. DO nOT smoke with the patch   Thick It will be sent   HAPPY bIRTHDAY!!  Need to discuss end of life issue with your sister

## 2013-04-30 NOTE — Progress Notes (Signed)
Subjective:    Patient ID: [name] [name], male    DOB: 02/15/1941, 72 y.o.   MRN: [id number]  HPI Preventive Screening-Counseling & Management   Patient present here today for a Medicare annual wellness visit.   Current Problems (verified)   Medications Prior to Visit Allergies (verified)   PAST HISTORY  Family History: 2 sibs living , 1 brother died of MI in his 42's  Social History Disabled, divorced , father of father of 8 children , Smokes still, no alcohol or street drugs    Risk Factors  Current exercise habits:  Daily   Dietary issues discussed:low sweets and fatty foods   Cardiac risk factors: Increased risk, CVD  Depression Screen  (Note: if answer to either of the following is "Yes", a more complete depression screening is indicated)   Over the past two weeks, have you felt down, depressed or hopeless? No  Over the past two weeks, have you felt little interest or pleasure in doing things? No  Have you lost interest or pleasure in daily life? No  Do you often feel hopeless? No  Do you cry easily over simple problems? No  Pt generally feels as if his quality of life is poor, and though he denies being depressed  Activities of Daily Living  In your present state of health, do you have any difficulty performing the following activities?  Driving?:unable, due to CVA with lower extremity weakness Managing money?: yes Feeding yourself?:No Getting from bed to chair?:yes, needs assistance Climbing a flight of stairs?:yes, unable Preparing food and eating?:yes , no food prep ability Bathing or showering?:yes Getting dressed?:dresses himself independently Getting to the toilet?:yes  Using the toilet?:No Moving around from place to place?: yes, ambulates with gait belt and assistance   Fall Risk Assessment In the past year have you fallen or had a near fall?:one near fall ambulates with assistance only with gait belt Are you currently taking any medications that  make you dizzy?:No   Hearing Difficulties: No Do you often ask people to speak up or repeat themselves?:No Do you experience ringing or noises in your ears?:No Do you have difficulty understanding soft or whispered voices?:No  Cognitive Testing  Alert? Yes Normal Appearance?Yes  Oriented to person? Yes Place? Yes  Time? Yes  Displays appropriate judgment?Yes  Can read the correct time from a watch face? yes Are you having problems remembering things?sometimes, generally not  Advanced Directives have been discussed with the patient?Yes , states he wants no ressucitation, needs to discuss with his sister and move forward with DNR paper if this is indeed his decision, sister was present and left the office before I entered the room   List the Names of Other Physician/Practitioners you currently use: Cardiology, dr Jerre Simon   Indicate any recent Medical Services you may have received from other than [facility] providers in the past year (date may be approximate).   Assessment:    Annual Wellness Exam   Plan:    During the course of the visit the patient was educated and counseled about appropriate screening and preventive services including:  A healthy diet is rich in fruit, vegetables and whole grains. Poultry fish, nuts and beans are a healthy choice for protein rather then red meat. A low sodium diet and drinking 64 ounces of water daily is generally recommended. Oils and sweet should be limited. Carbohydrates especially for those who are diabetic or overweight, should be limited to 30-45 gram per meal. It is important to eat  on a regular schedule, at least 3 times daily. Snacks should be primarily fruits, vegetables or nuts. It is important that you exercise regularly at least 30 minutes 5 times a week. If you develop chest pain, have severe difficulty breathing, or feel very tired, stop exercising immediately and seek medical attention  Immunization reviewed and updated. Cancer screening  reviewed and updated    Patient Instructions (the written plan) was given to the patient.  Medicare Attestation  I have personally reviewed:  The patient's medical and social history  Their use of alcohol, tobacco or illicit drugs  Their current medications and supplements  The patient's functional ability including ADLs,fall risks, home safety risks, cognitive, and hearing and visual impairment  Diet and physical activities  Evidence for depression or mood disorders  The patient's weight, height, BMI, and visual acuity have been recorded in the chart. I have made referrals, counseling, and provided education to the patient based on review of the above and I have provided the patient with a written personalized care plan for preventive services.      Review of Systems     Objective:   Physical Exam        Assessment & Plan:

## 2013-05-02 DIAGNOSIS — Z0000 Encounter for general adult medical examination without abnormal findings: Secondary | ICD-10-CM | POA: Insufficient documentation

## 2013-05-02 NOTE — Assessment & Plan Note (Signed)
Annual wellness as documented Pt unable to live independently, and needs assistance with mobility Facility  Requests use of nicotine patch to help with smoking cessation Pt states he is DNR, I will leave a message to discuss further with his sister in his record for clarification of this issue

## 2013-05-04 DIAGNOSIS — N3941 Urge incontinence: Secondary | ICD-10-CM | POA: Diagnosis not present

## 2013-05-04 DIAGNOSIS — R350 Frequency of micturition: Secondary | ICD-10-CM | POA: Diagnosis not present

## 2013-05-11 DIAGNOSIS — R350 Frequency of micturition: Secondary | ICD-10-CM | POA: Diagnosis not present

## 2013-05-11 DIAGNOSIS — N3941 Urge incontinence: Secondary | ICD-10-CM | POA: Diagnosis not present

## 2013-05-18 DIAGNOSIS — N3941 Urge incontinence: Secondary | ICD-10-CM | POA: Diagnosis not present

## 2013-05-18 DIAGNOSIS — R350 Frequency of micturition: Secondary | ICD-10-CM | POA: Diagnosis not present

## 2013-05-20 ENCOUNTER — Encounter: Payer: Self-pay | Admitting: *Deleted

## 2013-05-25 DIAGNOSIS — N3941 Urge incontinence: Secondary | ICD-10-CM | POA: Diagnosis not present

## 2013-05-25 DIAGNOSIS — R350 Frequency of micturition: Secondary | ICD-10-CM | POA: Diagnosis not present

## 2013-06-03 ENCOUNTER — Encounter: Payer: Medicare Other | Admitting: *Deleted

## 2013-06-03 DIAGNOSIS — Z7901 Long term (current) use of anticoagulants: Secondary | ICD-10-CM

## 2013-06-03 DIAGNOSIS — I2699 Other pulmonary embolism without acute cor pulmonale: Secondary | ICD-10-CM | POA: Diagnosis not present

## 2013-06-03 LAB — CBC
HCT: 44.6 % (ref 39.0–52.0)
Hemoglobin: 15.6 g/dL (ref 13.0–17.0)
MCH: 32.2 pg (ref 26.0–34.0)
MCHC: 35 g/dL (ref 30.0–36.0)
RBC: 4.85 MIL/uL (ref 4.22–5.81)

## 2013-06-03 LAB — PROTIME-INR: INR: 1.46 (ref <1.50)

## 2013-06-09 ENCOUNTER — Encounter: Payer: Self-pay | Admitting: *Deleted

## 2013-06-15 ENCOUNTER — Telehealth: Payer: Self-pay | Admitting: *Deleted

## 2013-06-15 DIAGNOSIS — R350 Frequency of micturition: Secondary | ICD-10-CM | POA: Diagnosis not present

## 2013-06-15 DIAGNOSIS — N3941 Urge incontinence: Secondary | ICD-10-CM | POA: Diagnosis not present

## 2013-06-15 DIAGNOSIS — I4891 Unspecified atrial fibrillation: Secondary | ICD-10-CM

## 2013-06-19 ENCOUNTER — Telehealth: Payer: Self-pay | Admitting: Family Medicine

## 2013-06-19 DIAGNOSIS — R29898 Other symptoms and signs involving the musculoskeletal system: Secondary | ICD-10-CM

## 2013-06-19 DIAGNOSIS — Z8673 Personal history of transient ischemic attack (TIA), and cerebral infarction without residual deficits: Secondary | ICD-10-CM

## 2013-06-19 NOTE — Telephone Encounter (Signed)
Spoke with home and they are aware and didn't voice any problems or concerns

## 2013-06-19 NOTE — Telephone Encounter (Signed)
Please contact the home where he lives, advise them pt is being referred for PT twice weekly  For 6 weeksto evaluate and strengthen lower extremities Referral is entered.  pt is not  home bound so i do not   believe that he will qualify for services at home, pls ensure this is not a problem for them, let me know if it is

## 2013-06-23 ENCOUNTER — Other Ambulatory Visit: Payer: Self-pay | Admitting: Cardiovascular Disease

## 2013-06-23 ENCOUNTER — Ambulatory Visit (HOSPITAL_COMMUNITY)
Admission: RE | Admit: 2013-06-23 | Discharge: 2013-06-23 | Disposition: A | Discharge status: Home / Self Care | Payer: Medicare Other | Source: Ambulatory Visit | Attending: Family Medicine | Admitting: Family Medicine

## 2013-06-23 DIAGNOSIS — IMO0001 Reserved for inherently not codable concepts without codable children: Secondary | ICD-10-CM | POA: Diagnosis not present

## 2013-06-23 DIAGNOSIS — R269 Unspecified abnormalities of gait and mobility: Secondary | ICD-10-CM | POA: Diagnosis not present

## 2013-06-23 DIAGNOSIS — M6281 Muscle weakness (generalized): Secondary | ICD-10-CM | POA: Insufficient documentation

## 2013-06-23 DIAGNOSIS — I4891 Unspecified atrial fibrillation: Secondary | ICD-10-CM | POA: Diagnosis not present

## 2013-06-23 LAB — BASIC METABOLIC PANEL WITH GFR
BUN: 16 mg/dL (ref 6–23)
Calcium: 9.1 mg/dL (ref 8.4–10.5)
Chloride: 104 mEq/L (ref 96–112)
GFR, Est African American: 83 mL/min
Glucose, Bld: 78 mg/dL (ref 70–99)
Potassium: 4.7 mEq/L (ref 3.5–5.3)
Sodium: 138 mEq/L (ref 135–145)
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
## 2013-06-29 ENCOUNTER — Encounter: Payer: Self-pay | Admitting: *Deleted

## 2013-07-06 ENCOUNTER — Telehealth: Payer: Self-pay | Admitting: Family Medicine

## 2013-07-06 NOTE — Telephone Encounter (Signed)
Pls call pharmacy and caregiver with d/c order for nicotine since the report from caregiver is that he continues to smoke so there is no need to prescribe the medication , pls fax script writen also

## 2013-07-07 NOTE — Telephone Encounter (Signed)
Called [facility] and they verified they received the fax to D/c the med

## 2013-07-13 DIAGNOSIS — N3941 Urge incontinence: Secondary | ICD-10-CM | POA: Diagnosis not present

## 2013-07-13 DIAGNOSIS — R350 Frequency of micturition: Secondary | ICD-10-CM | POA: Diagnosis not present

## 2013-07-16 ENCOUNTER — Telehealth: Payer: Self-pay | Admitting: Cardiovascular Disease

## 2013-07-16 MED ORDER — RIVAROXABAN 20 MG PO TABS: 20.0000 mg | ORAL_TABLET | Freq: Every day | ORAL | Status: DC

## 2013-07-16 NOTE — Telephone Encounter (Signed)
Medication sent via escribe.  

## 2013-07-16 NOTE — Telephone Encounter (Signed)
Rivaroxaban (XARELTO) 20 MG TABS tablet Refill needed to be faxed in  [facility]  [phone]

## 2013-08-03 DIAGNOSIS — N3941 Urge incontinence: Secondary | ICD-10-CM | POA: Diagnosis not present

## 2013-08-03 DIAGNOSIS — R350 Frequency of micturition: Secondary | ICD-10-CM | POA: Diagnosis not present

## 2013-08-10 ENCOUNTER — Telehealth: Payer: Self-pay | Admitting: Family Medicine

## 2013-08-11 ENCOUNTER — Telehealth: Payer: Self-pay | Admitting: Family Medicine

## 2013-08-12 MED ORDER — CLOTRIMAZOLE-BETAMETHASONE 1-0.05 % EX CREA: 1.0000 "application " | TOPICAL_CREAM | Freq: Two times a day (BID) | CUTANEOUS | Status: DC

## 2013-08-12 NOTE — Telephone Encounter (Signed)
Like fungal rash so cream sent advise if not better needs to sched an appt in 10 days pls. Also pls ask if drainage form the rash/skin breakdown, if so will need to be seen

## 2013-08-12 NOTE — Telephone Encounter (Signed)
Attempted to call facility.  Phone ringing busy.  Will make another attempt.

## 2013-08-12 NOTE — Telephone Encounter (Signed)
No drainage noted.  Described as irritation of the skin with burning.  Advised to leave off brief is possible and use cream as prescribed.

## 2013-08-12 NOTE — Telephone Encounter (Signed)
Please advise 

## 2013-08-24 DIAGNOSIS — R350 Frequency of micturition: Secondary | ICD-10-CM | POA: Diagnosis not present

## 2013-08-24 DIAGNOSIS — N3941 Urge incontinence: Secondary | ICD-10-CM | POA: Diagnosis not present

## 2013-08-25 ENCOUNTER — Other Ambulatory Visit: Payer: Self-pay | Admitting: Family Medicine

## 2013-08-31 ENCOUNTER — Ambulatory Visit: Payer: Medicare Other | Admitting: Family Medicine

## 2013-08-31 NOTE — Progress Notes (Signed)
This encounter was created in error - please disregard.

## 2013-09-03 ENCOUNTER — Other Ambulatory Visit: Payer: Self-pay | Admitting: Family Medicine

## 2013-09-28 ENCOUNTER — Other Ambulatory Visit: Payer: Self-pay | Admitting: Family Medicine

## 2013-10-19 DIAGNOSIS — N3941 Urge incontinence: Secondary | ICD-10-CM | POA: Diagnosis not present

## 2013-10-30 NOTE — Telephone Encounter (Signed)
This was 1.6.2015

## 2013-11-09 DIAGNOSIS — N3941 Urge incontinence: Secondary | ICD-10-CM | POA: Diagnosis not present

## 2013-11-09 DIAGNOSIS — R350 Frequency of micturition: Secondary | ICD-10-CM | POA: Diagnosis not present

## 2013-11-16 ENCOUNTER — Other Ambulatory Visit: Payer: Self-pay | Admitting: Cardiovascular Disease

## 2013-11-18 ENCOUNTER — Other Ambulatory Visit: Payer: Self-pay | Admitting: Family Medicine

## 2013-11-30 ENCOUNTER — Emergency Department (HOSPITAL_COMMUNITY): Payer: Medicare Other

## 2013-11-30 ENCOUNTER — Encounter (HOSPITAL_COMMUNITY): Payer: Self-pay | Admitting: Emergency Medicine

## 2013-11-30 ENCOUNTER — Telehealth: Payer: Self-pay | Admitting: Family Medicine

## 2013-11-30 ENCOUNTER — Telehealth: Payer: Self-pay

## 2013-11-30 ENCOUNTER — Emergency Department (HOSPITAL_COMMUNITY)
Admission: EM | Admit: 2013-11-30 | Discharge: 2013-11-30 | Disposition: A | Discharge status: Home / Self Care | Payer: Medicare Other | Attending: Emergency Medicine | Admitting: Emergency Medicine

## 2013-11-30 DIAGNOSIS — R5383 Other fatigue: Secondary | ICD-10-CM

## 2013-11-30 DIAGNOSIS — I10 Essential (primary) hypertension: Secondary | ICD-10-CM

## 2013-11-30 DIAGNOSIS — R5381 Other malaise: Secondary | ICD-10-CM

## 2013-11-30 DIAGNOSIS — F4489 Other dissociative and conversion disorders: Secondary | ICD-10-CM | POA: Diagnosis not present

## 2013-11-30 DIAGNOSIS — F341 Dysthymic disorder: Secondary | ICD-10-CM | POA: Diagnosis not present

## 2013-11-30 DIAGNOSIS — M549 Dorsalgia, unspecified: Secondary | ICD-10-CM

## 2013-11-30 DIAGNOSIS — E785 Hyperlipidemia, unspecified: Secondary | ICD-10-CM | POA: Insufficient documentation

## 2013-11-30 DIAGNOSIS — Z7901 Long term (current) use of anticoagulants: Secondary | ICD-10-CM | POA: Diagnosis not present

## 2013-11-30 DIAGNOSIS — M1990 Unspecified osteoarthritis, unspecified site: Secondary | ICD-10-CM | POA: Insufficient documentation

## 2013-11-30 DIAGNOSIS — Z87448 Personal history of other diseases of urinary system: Secondary | ICD-10-CM | POA: Diagnosis not present

## 2013-11-30 DIAGNOSIS — IMO0002 Reserved for concepts with insufficient information to code with codable children: Secondary | ICD-10-CM | POA: Insufficient documentation

## 2013-11-30 DIAGNOSIS — E669 Obesity, unspecified: Secondary | ICD-10-CM | POA: Diagnosis not present

## 2013-11-30 DIAGNOSIS — F0390 Unspecified dementia without behavioral disturbance: Secondary | ICD-10-CM | POA: Insufficient documentation

## 2013-11-30 DIAGNOSIS — M949 Disorder of cartilage, unspecified: Secondary | ICD-10-CM

## 2013-11-30 DIAGNOSIS — Z8673 Personal history of transient ischemic attack (TIA), and cerebral infarction without residual deficits: Secondary | ICD-10-CM | POA: Insufficient documentation

## 2013-11-30 DIAGNOSIS — Z79899 Other long term (current) drug therapy: Secondary | ICD-10-CM | POA: Insufficient documentation

## 2013-11-30 DIAGNOSIS — Z86711 Personal history of pulmonary embolism: Secondary | ICD-10-CM | POA: Insufficient documentation

## 2013-11-30 DIAGNOSIS — M545 Low back pain, unspecified: Secondary | ICD-10-CM | POA: Insufficient documentation

## 2013-11-30 DIAGNOSIS — M1710 Unilateral primary osteoarthritis, unspecified knee: Secondary | ICD-10-CM | POA: Insufficient documentation

## 2013-11-30 DIAGNOSIS — E739 Lactose intolerance, unspecified: Secondary | ICD-10-CM

## 2013-11-30 DIAGNOSIS — Z8719 Personal history of other diseases of the digestive system: Secondary | ICD-10-CM | POA: Insufficient documentation

## 2013-11-30 DIAGNOSIS — R7301 Impaired fasting glucose: Secondary | ICD-10-CM

## 2013-11-30 DIAGNOSIS — G9389 Other specified disorders of brain: Secondary | ICD-10-CM | POA: Diagnosis not present

## 2013-11-30 DIAGNOSIS — M899 Disorder of bone, unspecified: Secondary | ICD-10-CM

## 2013-11-30 DIAGNOSIS — F17200 Nicotine dependence, unspecified, uncomplicated: Secondary | ICD-10-CM | POA: Insufficient documentation

## 2013-11-30 DIAGNOSIS — M47817 Spondylosis without myelopathy or radiculopathy, lumbosacral region: Secondary | ICD-10-CM | POA: Diagnosis not present

## 2013-11-30 DIAGNOSIS — N3941 Urge incontinence: Secondary | ICD-10-CM | POA: Diagnosis not present

## 2013-11-30 DIAGNOSIS — R350 Frequency of micturition: Secondary | ICD-10-CM | POA: Diagnosis not present

## 2013-11-30 DIAGNOSIS — I6789 Other cerebrovascular disease: Secondary | ICD-10-CM | POA: Diagnosis not present

## 2013-11-30 HISTORY — DX: Depression, unspecified: F32.A

## 2013-11-30 HISTORY — DX: Suicide attempt, initial encounter: T14.91XA

## 2013-11-30 HISTORY — DX: Major depressive disorder, single episode, unspecified: F32.9

## 2013-11-30 HISTORY — DX: Cerebral aneurysm, nonruptured: I67.1

## 2013-11-30 MED ORDER — KETOROLAC TROMETHAMINE 30 MG/ML IJ SOLN
30.0000 mg | Freq: Once | INTRAMUSCULAR | Status: AC
  Administered 2013-11-30: 30 mg via INTRAVENOUS
  Filled 2013-11-30: qty 1

## 2013-11-30 NOTE — ED Notes (Signed)
Family called and advised pt ready for discharge.

## 2013-11-30 NOTE — ED Notes (Signed)
EMS reports [facility] resident of [facility] and is wheelchair bound.  Reports pt c/o back pain x 2 days after straining to get up out of bed.
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
## 2013-11-30 NOTE — Discharge Instructions (Signed)
And plain films of back and CT head showed no acute findings. Tylenol for pain. Followup your primary care Dr.

## 2013-11-30 NOTE — Telephone Encounter (Addendum)
Pls call facility, speak with Vaughan Basta, who called me form the ED Stated she was told he had no recent stroke (head Ct scan is negative),  And she was concerned that he was not being hospitalized. After further discussion, she said she was afraid of him falling due to weakness that he is unable to stand. Requests PT, pLEASE refer to home PT/OT s/p CVA with progressive weakness and high fall risk ALSO he needs cbc and diff, fasting cmp and EGFr, HBA1C, lipids, TSH and vit D, this week also CCUA and reflex c/s (past h/o recurrent UTI), h/h can draw, and he needs OV next weeks, has missed appt, not seen since 7 months ago, also for h/h involvement needs face to face OV, let them know importance of keep and make appt, pls also specifically request that Vaughan Basta accompany him to give update on general status

## 2013-11-30 NOTE — ED Notes (Signed)
Pt c/o lower back pain since Thursday. Pt denies injury.

## 2013-11-30 NOTE — Telephone Encounter (Signed)
Facility called in to report they thought [name] [name] has had a stroke. They were advised to take him directly to the ER by nurse and Dr Lodema HongSimpson

## 2013-12-01 ENCOUNTER — Ambulatory Visit: Payer: Medicare Other | Admitting: Family Medicine

## 2013-12-01 NOTE — Telephone Encounter (Signed)
Called patient and left message for them to return call at the office   

## 2013-12-02 NOTE — Addendum Note (Signed)
Addended by: Abner GreenspanHUDY, Jaime Dome H on: 12/02/2013 03:17 PM   Modules accepted: Orders

## 2013-12-04 NOTE — Telephone Encounter (Signed)
Caregiver came to the office to collect lab order and she is aware it is needed before his next appt

## 2013-12-07 DIAGNOSIS — R7301 Impaired fasting glucose: Secondary | ICD-10-CM | POA: Diagnosis not present

## 2013-12-07 DIAGNOSIS — I10 Essential (primary) hypertension: Secondary | ICD-10-CM | POA: Diagnosis not present

## 2013-12-07 DIAGNOSIS — R5381 Other malaise: Secondary | ICD-10-CM | POA: Diagnosis not present

## 2013-12-07 DIAGNOSIS — M949 Disorder of cartilage, unspecified: Secondary | ICD-10-CM | POA: Diagnosis not present

## 2013-12-07 DIAGNOSIS — M549 Dorsalgia, unspecified: Secondary | ICD-10-CM | POA: Diagnosis not present

## 2013-12-07 DIAGNOSIS — E785 Hyperlipidemia, unspecified: Secondary | ICD-10-CM | POA: Diagnosis not present

## 2013-12-07 DIAGNOSIS — M899 Disorder of bone, unspecified: Secondary | ICD-10-CM | POA: Diagnosis not present

## 2013-12-07 DIAGNOSIS — R5383 Other fatigue: Secondary | ICD-10-CM | POA: Diagnosis not present

## 2013-12-07 LAB — COMPLETE METABOLIC PANEL WITH GFR
ALK PHOS: 98 U/L (ref 39–117)
ALT: <8 U/L (ref 0–53)
AST: 10 U/L (ref 0–37)
Albumin: 3.9 g/dL (ref 3.5–5.2)
BILIRUBIN TOTAL: 0.6 mg/dL (ref 0.2–1.2)
BUN: 19 mg/dL (ref 6–23)
CO2: 25 meq/L (ref 19–32)
CREATININE: 1.23 mg/dL (ref 0.50–1.35)
Calcium: 9.6 mg/dL (ref 8.4–10.5)
Chloride: 102 mEq/L (ref 96–112)
GFR, EST NON AFRICAN AMERICAN: 58 mL/min — AB
GFR, Est African American: 67 mL/min
Glucose, Bld: 80 mg/dL (ref 70–99)
Potassium: 4.6 mEq/L (ref 3.5–5.3)
Sodium: 139 mEq/L (ref 135–145)
Total Protein: 7.8 g/dL (ref 6.0–8.3)

## 2013-12-07 LAB — CBC WITH DIFFERENTIAL/PLATELET
BASOS PCT: 0 % (ref 0–1)
Basophils Absolute: 0 10*3/uL (ref 0.0–0.1)
EOS ABS: 0.2 10*3/uL (ref 0.0–0.7)
EOS PCT: 5 % (ref 0–5)
HEMATOCRIT: 42.5 % (ref 39.0–52.0)
HEMOGLOBIN: 15.2 g/dL (ref 13.0–17.0)
LYMPHS ABS: 1.5 10*3/uL (ref 0.7–4.0)
Lymphocytes Relative: 34 % (ref 12–46)
MCH: 32.5 pg (ref 26.0–34.0)
MCHC: 35.8 g/dL (ref 30.0–36.0)
MCV: 90.8 fL (ref 78.0–100.0)
MONO ABS: 0.5 10*3/uL (ref 0.1–1.0)
Monocytes Relative: 11 % (ref 3–12)
NEUTROS PCT: 50 % (ref 43–77)
Neutro Abs: 2.3 10*3/uL (ref 1.7–7.7)
Platelets: 132 10*3/uL — ABNORMAL LOW (ref 150–400)
RBC: 4.68 MIL/uL (ref 4.22–5.81)
RDW: 13.6 % (ref 11.5–15.5)
WBC: 4.5 10*3/uL (ref 4.0–10.5)

## 2013-12-07 LAB — LIPID PANEL
CHOLESTEROL: 148 mg/dL (ref 0–200)
HDL: 37 mg/dL — ABNORMAL LOW (ref >39)
LDL Cholesterol: 88 mg/dL (ref 0–99)
TRIGLYCERIDES: 114 mg/dL (ref <150)
Total CHOL/HDL Ratio: 4 Ratio
VLDL: 23 mg/dL (ref 0–40)

## 2013-12-07 LAB — TSH: TSH: 0.765 u[IU]/mL (ref 0.350–4.500)

## 2013-12-07 LAB — HEMOGLOBIN A1C
HEMOGLOBIN A1C: 5.8 % — AB (ref <5.7)
Mean Plasma Glucose: 120 mg/dL — ABNORMAL HIGH (ref <117)

## 2013-12-08 ENCOUNTER — Encounter: Payer: Self-pay | Admitting: Family Medicine

## 2013-12-08 ENCOUNTER — Ambulatory Visit (INDEPENDENT_AMBULATORY_CARE_PROVIDER_SITE_OTHER): Payer: Medicare Other | Admitting: Family Medicine

## 2013-12-08 VITALS — BP 100/64 | HR 60 | Resp 16 | Ht 69.0 in | Wt 200.0 lb

## 2013-12-08 DIAGNOSIS — M159 Polyosteoarthritis, unspecified: Secondary | ICD-10-CM

## 2013-12-08 DIAGNOSIS — I2589 Other forms of chronic ischemic heart disease: Secondary | ICD-10-CM

## 2013-12-08 DIAGNOSIS — I679 Cerebrovascular disease, unspecified: Secondary | ICD-10-CM

## 2013-12-08 DIAGNOSIS — F17200 Nicotine dependence, unspecified, uncomplicated: Secondary | ICD-10-CM

## 2013-12-08 DIAGNOSIS — F17208 Nicotine dependence, unspecified, with other nicotine-induced disorders: Secondary | ICD-10-CM

## 2013-12-08 DIAGNOSIS — I10 Essential (primary) hypertension: Secondary | ICD-10-CM

## 2013-12-08 DIAGNOSIS — E785 Hyperlipidemia, unspecified: Secondary | ICD-10-CM

## 2013-12-08 DIAGNOSIS — E559 Vitamin D deficiency, unspecified: Secondary | ICD-10-CM

## 2013-12-08 DIAGNOSIS — F19988 Other psychoactive substance use, unspecified with other psychoactive substance-induced disorder: Secondary | ICD-10-CM

## 2013-12-08 DIAGNOSIS — M158 Other polyosteoarthritis: Secondary | ICD-10-CM

## 2013-12-08 LAB — URINALYSIS W MICROSCOPIC + REFLEX CULTURE
Bacteria, UA: NONE SEEN
Bilirubin Urine: NEGATIVE
Crystals: NONE SEEN
GLUCOSE, UA: NEGATIVE mg/dL
Hgb urine dipstick: NEGATIVE
Ketones, ur: NEGATIVE mg/dL
Nitrite: NEGATIVE
PH: 5.5 (ref 5.0–8.0)
PROTEIN: NEGATIVE mg/dL
Specific Gravity, Urine: 1.017 (ref 1.005–1.030)
Urobilinogen, UA: 1 mg/dL (ref 0.0–1.0)

## 2013-12-08 LAB — VITAMIN D 25 HYDROXY (VIT D DEFICIENCY, FRACTURES): VIT D 25 HYDROXY: 20 ng/mL — AB (ref 30–89)

## 2013-12-08 MED ORDER — ERGOCALCIFEROL 1.25 MG (50000 UT) PO CAPS: 50000.0000 [IU] | ORAL_CAPSULE | ORAL | Status: AC

## 2013-12-08 MED ORDER — GABAPENTIN 100 MG PO CAPS: 100.0000 mg | ORAL_CAPSULE | Freq: Every day | ORAL | Status: DC

## 2013-12-08 NOTE — Progress Notes (Signed)
   Subjective:    Patient ID: [name] SawyersDavid T [name], male    DOB: February 22, 1941, 73 y.o.   MRN: [id number]  HPI  Pt in for f/u chronic medical conditions, with specific request for help with physical erapy for strengthening. Also has a c/o back pian. States he ahs appt with [facility] in next 4 weeks Immunization and cancer screening needs are updated  Review of Systems See HPI Denies recent fever or chills. Denies sinus pressure, nasal congestion, ear pain or sore throat. Denies chest congestion, productive cough or wheezing. Denies chest pains, palpitations and leg swelling Denies abdominal pain, nausea, vomiting,diarrhea or constipation.   Denies dysuria, frequency, hesitancy or incontinence. Denies headaches, seizures,  Denies depression, anxiety or insomnia. Denies skin break down or rash.        Objective:   Physical Exam BP 100/64  Pulse 60  Resp 16  Ht 5\' 9"  (1.753 m)  Wt 200 lb (90.719 kg)  BMI 29.52 kg/m2  SpO2 99% Patient alert and oriented and in no cardiopulmonary distress.  HEENT: No facial asymmetry, EOMI,   oropharynx pink and moist.  Neck supple no JVD, no mass.  Chest: Clear to auscultation bilaterally.  CVS: S1, S2 no murmurs, no S3.Regular rate.  ABD: Soft non tender.   Ext: No edema  WG:[id number]:decreased ROM spine, shoulders, hips and knees.  Skin: Intact, no ulcerations or rash noted.  Psych: Good eye contact, normal affect. Memory intact not anxious or depressed appearing.  CNS: CN 2-12 intact, grade 3  power,  In left upper and lower extremities      Assessment & Plan:  Hypertension Controlled, no change in medication   Osteoarthrosis, generalized, involving multiple sites C/o increased back pain trial of gabapentin   Cigarette nicotine dependence with nicotine-induced disorder Improving, down to 3 per day Patient counseled for approximately 5 minutes regarding the health risks of ongoing nicotine use, specifically all types of cancer,  heart disease, stroke and respiratory failure. The options available for help with cessation ,the behavioral changes to assist the process, and the option to either gradully reduce usage  Or abruptly stop.is also discussed. Pt is also encouraged to set specific goals in number of cigarettes used daily, as well as to set a quit date.   Vitamin D deficiency Level is low, pt advised to start weekly supplement  Hyperlipidemia with target LDL less than 100 Low HDL , otherwise at goal. No med change , increased physical activity , as able, encouraged  Cerebrovascular disease Ongoing left hemiparesis. Pt notes deterioration in level of function due to progressive weakness PT/OT referral to help with this

## 2013-12-08 NOTE — Patient Instructions (Addendum)
Annual wellness in ealy to mid October, call if you need me before  Your labs are excellent, except that your vitamin D level is low.  You need to start once weekly vitamin D  For back pain , you will start gabapentin 100mg  one at bedtime  Dure to noted weakness and inrceased fall risk from history provided , you are referred for home PT/OT twice weekly for 6 to 8 weeks. It is VERY important , that you continue the exercises taught after the therapy sessions end.  Good that you are down to 3 cigarettes per day, BEST for you to Quit , so I hope that you do so in the next 3 months

## 2013-12-09 LAB — URINE CULTURE

## 2013-12-24 ENCOUNTER — Other Ambulatory Visit: Payer: Self-pay | Admitting: Family Medicine

## 2013-12-25 ENCOUNTER — Telehealth: Payer: Self-pay | Admitting: Family Medicine

## 2013-12-25 NOTE — Telephone Encounter (Signed)
Med refilled.

## 2013-12-30 ENCOUNTER — Telehealth: Payer: Self-pay | Admitting: Family Medicine

## 2013-12-30 NOTE — Telephone Encounter (Signed)
noted 

## 2014-01-01 DIAGNOSIS — IMO0001 Reserved for inherently not codable concepts without codable children: Secondary | ICD-10-CM | POA: Diagnosis not present

## 2014-01-01 DIAGNOSIS — I69922 Dysarthria following unspecified cerebrovascular disease: Secondary | ICD-10-CM | POA: Diagnosis not present

## 2014-01-01 DIAGNOSIS — I10 Essential (primary) hypertension: Secondary | ICD-10-CM | POA: Diagnosis not present

## 2014-01-01 DIAGNOSIS — M159 Polyosteoarthritis, unspecified: Secondary | ICD-10-CM | POA: Diagnosis not present

## 2014-01-01 DIAGNOSIS — Z9181 History of falling: Secondary | ICD-10-CM | POA: Diagnosis not present

## 2014-01-01 DIAGNOSIS — Z8673 Personal history of transient ischemic attack (TIA), and cerebral infarction without residual deficits: Secondary | ICD-10-CM | POA: Diagnosis not present

## 2014-01-04 DIAGNOSIS — N3941 Urge incontinence: Secondary | ICD-10-CM | POA: Diagnosis not present

## 2014-01-04 DIAGNOSIS — R350 Frequency of micturition: Secondary | ICD-10-CM | POA: Diagnosis not present

## 2014-01-05 DIAGNOSIS — I10 Essential (primary) hypertension: Secondary | ICD-10-CM | POA: Diagnosis not present

## 2014-01-05 DIAGNOSIS — Z8673 Personal history of transient ischemic attack (TIA), and cerebral infarction without residual deficits: Secondary | ICD-10-CM | POA: Diagnosis not present

## 2014-01-05 DIAGNOSIS — M159 Polyosteoarthritis, unspecified: Secondary | ICD-10-CM | POA: Diagnosis not present

## 2014-01-05 DIAGNOSIS — Z9181 History of falling: Secondary | ICD-10-CM | POA: Diagnosis not present

## 2014-01-05 DIAGNOSIS — I69922 Dysarthria following unspecified cerebrovascular disease: Secondary | ICD-10-CM | POA: Diagnosis not present

## 2014-01-05 DIAGNOSIS — IMO0001 Reserved for inherently not codable concepts without codable children: Secondary | ICD-10-CM | POA: Diagnosis not present

## 2014-01-06 DIAGNOSIS — M159 Polyosteoarthritis, unspecified: Secondary | ICD-10-CM | POA: Diagnosis not present

## 2014-01-06 DIAGNOSIS — Z8673 Personal history of transient ischemic attack (TIA), and cerebral infarction without residual deficits: Secondary | ICD-10-CM | POA: Diagnosis not present

## 2014-01-06 DIAGNOSIS — I10 Essential (primary) hypertension: Secondary | ICD-10-CM | POA: Diagnosis not present

## 2014-01-06 DIAGNOSIS — IMO0001 Reserved for inherently not codable concepts without codable children: Secondary | ICD-10-CM | POA: Diagnosis not present

## 2014-01-06 DIAGNOSIS — Z9181 History of falling: Secondary | ICD-10-CM | POA: Diagnosis not present

## 2014-01-06 DIAGNOSIS — I69922 Dysarthria following unspecified cerebrovascular disease: Secondary | ICD-10-CM | POA: Diagnosis not present

## 2014-01-08 DIAGNOSIS — Z9181 History of falling: Secondary | ICD-10-CM | POA: Diagnosis not present

## 2014-01-08 DIAGNOSIS — I69922 Dysarthria following unspecified cerebrovascular disease: Secondary | ICD-10-CM | POA: Diagnosis not present

## 2014-01-08 DIAGNOSIS — M159 Polyosteoarthritis, unspecified: Secondary | ICD-10-CM | POA: Diagnosis not present

## 2014-01-08 DIAGNOSIS — IMO0001 Reserved for inherently not codable concepts without codable children: Secondary | ICD-10-CM | POA: Diagnosis not present

## 2014-01-08 DIAGNOSIS — I10 Essential (primary) hypertension: Secondary | ICD-10-CM | POA: Diagnosis not present

## 2014-01-08 DIAGNOSIS — Z8673 Personal history of transient ischemic attack (TIA), and cerebral infarction without residual deficits: Secondary | ICD-10-CM | POA: Diagnosis not present

## 2014-01-11 DIAGNOSIS — I10 Essential (primary) hypertension: Secondary | ICD-10-CM | POA: Diagnosis not present

## 2014-01-11 DIAGNOSIS — I69922 Dysarthria following unspecified cerebrovascular disease: Secondary | ICD-10-CM | POA: Diagnosis not present

## 2014-01-11 DIAGNOSIS — Z8673 Personal history of transient ischemic attack (TIA), and cerebral infarction without residual deficits: Secondary | ICD-10-CM | POA: Diagnosis not present

## 2014-01-11 DIAGNOSIS — IMO0001 Reserved for inherently not codable concepts without codable children: Secondary | ICD-10-CM | POA: Diagnosis not present

## 2014-01-11 DIAGNOSIS — Z9181 History of falling: Secondary | ICD-10-CM | POA: Diagnosis not present

## 2014-01-11 DIAGNOSIS — M159 Polyosteoarthritis, unspecified: Secondary | ICD-10-CM | POA: Diagnosis not present

## 2014-01-13 DIAGNOSIS — Z8673 Personal history of transient ischemic attack (TIA), and cerebral infarction without residual deficits: Secondary | ICD-10-CM | POA: Diagnosis not present

## 2014-01-13 DIAGNOSIS — I10 Essential (primary) hypertension: Secondary | ICD-10-CM | POA: Diagnosis not present

## 2014-01-13 DIAGNOSIS — M159 Polyosteoarthritis, unspecified: Secondary | ICD-10-CM | POA: Diagnosis not present

## 2014-01-13 DIAGNOSIS — I69922 Dysarthria following unspecified cerebrovascular disease: Secondary | ICD-10-CM | POA: Diagnosis not present

## 2014-01-13 DIAGNOSIS — IMO0001 Reserved for inherently not codable concepts without codable children: Secondary | ICD-10-CM | POA: Diagnosis not present

## 2014-01-13 DIAGNOSIS — Z9181 History of falling: Secondary | ICD-10-CM | POA: Diagnosis not present

## 2014-01-18 DIAGNOSIS — M159 Polyosteoarthritis, unspecified: Secondary | ICD-10-CM | POA: Diagnosis not present

## 2014-01-18 DIAGNOSIS — I10 Essential (primary) hypertension: Secondary | ICD-10-CM | POA: Diagnosis not present

## 2014-01-18 DIAGNOSIS — I69922 Dysarthria following unspecified cerebrovascular disease: Secondary | ICD-10-CM | POA: Diagnosis not present

## 2014-01-18 DIAGNOSIS — Z9181 History of falling: Secondary | ICD-10-CM | POA: Diagnosis not present

## 2014-01-18 DIAGNOSIS — IMO0001 Reserved for inherently not codable concepts without codable children: Secondary | ICD-10-CM | POA: Diagnosis not present

## 2014-01-18 DIAGNOSIS — Z8673 Personal history of transient ischemic attack (TIA), and cerebral infarction without residual deficits: Secondary | ICD-10-CM | POA: Diagnosis not present

## 2014-01-21 DIAGNOSIS — I69922 Dysarthria following unspecified cerebrovascular disease: Secondary | ICD-10-CM | POA: Diagnosis not present

## 2014-01-21 DIAGNOSIS — I10 Essential (primary) hypertension: Secondary | ICD-10-CM | POA: Diagnosis not present

## 2014-01-21 DIAGNOSIS — IMO0001 Reserved for inherently not codable concepts without codable children: Secondary | ICD-10-CM | POA: Diagnosis not present

## 2014-01-21 DIAGNOSIS — M159 Polyosteoarthritis, unspecified: Secondary | ICD-10-CM | POA: Diagnosis not present

## 2014-01-21 DIAGNOSIS — Z8673 Personal history of transient ischemic attack (TIA), and cerebral infarction without residual deficits: Secondary | ICD-10-CM | POA: Diagnosis not present

## 2014-01-21 DIAGNOSIS — Z9181 History of falling: Secondary | ICD-10-CM | POA: Diagnosis not present

## 2014-01-25 DIAGNOSIS — IMO0001 Reserved for inherently not codable concepts without codable children: Secondary | ICD-10-CM | POA: Diagnosis not present

## 2014-01-25 DIAGNOSIS — Z8673 Personal history of transient ischemic attack (TIA), and cerebral infarction without residual deficits: Secondary | ICD-10-CM | POA: Diagnosis not present

## 2014-01-25 DIAGNOSIS — Z9181 History of falling: Secondary | ICD-10-CM | POA: Diagnosis not present

## 2014-01-25 DIAGNOSIS — I69922 Dysarthria following unspecified cerebrovascular disease: Secondary | ICD-10-CM | POA: Diagnosis not present

## 2014-01-25 DIAGNOSIS — M159 Polyosteoarthritis, unspecified: Secondary | ICD-10-CM | POA: Diagnosis not present

## 2014-01-25 DIAGNOSIS — I10 Essential (primary) hypertension: Secondary | ICD-10-CM | POA: Diagnosis not present

## 2014-01-28 DIAGNOSIS — I10 Essential (primary) hypertension: Secondary | ICD-10-CM | POA: Diagnosis not present

## 2014-01-28 DIAGNOSIS — IMO0001 Reserved for inherently not codable concepts without codable children: Secondary | ICD-10-CM | POA: Diagnosis not present

## 2014-01-28 DIAGNOSIS — I69922 Dysarthria following unspecified cerebrovascular disease: Secondary | ICD-10-CM | POA: Diagnosis not present

## 2014-01-28 DIAGNOSIS — Z8673 Personal history of transient ischemic attack (TIA), and cerebral infarction without residual deficits: Secondary | ICD-10-CM | POA: Diagnosis not present

## 2014-01-28 DIAGNOSIS — M159 Polyosteoarthritis, unspecified: Secondary | ICD-10-CM | POA: Diagnosis not present

## 2014-01-28 DIAGNOSIS — Z9181 History of falling: Secondary | ICD-10-CM | POA: Diagnosis not present

## 2014-02-01 DIAGNOSIS — I69922 Dysarthria following unspecified cerebrovascular disease: Secondary | ICD-10-CM

## 2014-02-01 DIAGNOSIS — Z8673 Personal history of transient ischemic attack (TIA), and cerebral infarction without residual deficits: Secondary | ICD-10-CM | POA: Diagnosis not present

## 2014-02-01 DIAGNOSIS — R350 Frequency of micturition: Secondary | ICD-10-CM | POA: Diagnosis not present

## 2014-02-01 DIAGNOSIS — N3941 Urge incontinence: Secondary | ICD-10-CM | POA: Diagnosis not present

## 2014-02-01 DIAGNOSIS — IMO0001 Reserved for inherently not codable concepts without codable children: Secondary | ICD-10-CM

## 2014-02-01 DIAGNOSIS — M159 Polyosteoarthritis, unspecified: Secondary | ICD-10-CM

## 2014-02-02 ENCOUNTER — Other Ambulatory Visit: Payer: Self-pay | Admitting: Family Medicine

## 2014-02-02 ENCOUNTER — Telehealth: Payer: Self-pay | Admitting: Family Medicine

## 2014-02-02 DIAGNOSIS — IMO0001 Reserved for inherently not codable concepts without codable children: Secondary | ICD-10-CM | POA: Diagnosis not present

## 2014-02-02 DIAGNOSIS — Z8673 Personal history of transient ischemic attack (TIA), and cerebral infarction without residual deficits: Secondary | ICD-10-CM | POA: Diagnosis not present

## 2014-02-02 DIAGNOSIS — I69922 Dysarthria following unspecified cerebrovascular disease: Secondary | ICD-10-CM | POA: Diagnosis not present

## 2014-02-02 DIAGNOSIS — I10 Essential (primary) hypertension: Secondary | ICD-10-CM | POA: Diagnosis not present

## 2014-02-02 DIAGNOSIS — Z9181 History of falling: Secondary | ICD-10-CM | POA: Diagnosis not present

## 2014-02-02 DIAGNOSIS — M159 Polyosteoarthritis, unspecified: Secondary | ICD-10-CM | POA: Diagnosis not present

## 2014-02-02 NOTE — Telephone Encounter (Signed)
Loura HaltStacy [name], physical therapist with [facility], called and stated when she went into the home this morning [name] [name] stated he wasn't feeling well and she took his BP and it was 80/48. Patient had told her he had already taken his BP medicine. [name] ArnoldStacy wants to know what to advise patient to do. Please Advise. [name] [name] [name]'s phone is [phone].

## 2014-02-03 DIAGNOSIS — I10 Essential (primary) hypertension: Secondary | ICD-10-CM | POA: Diagnosis not present

## 2014-02-03 DIAGNOSIS — M159 Polyosteoarthritis, unspecified: Secondary | ICD-10-CM | POA: Diagnosis not present

## 2014-02-03 DIAGNOSIS — I69922 Dysarthria following unspecified cerebrovascular disease: Secondary | ICD-10-CM | POA: Diagnosis not present

## 2014-02-03 DIAGNOSIS — Z9181 History of falling: Secondary | ICD-10-CM | POA: Diagnosis not present

## 2014-02-03 DIAGNOSIS — IMO0001 Reserved for inherently not codable concepts without codable children: Secondary | ICD-10-CM | POA: Diagnosis not present

## 2014-02-03 DIAGNOSIS — Z8673 Personal history of transient ischemic attack (TIA), and cerebral infarction without residual deficits: Secondary | ICD-10-CM | POA: Diagnosis not present

## 2014-02-08 DIAGNOSIS — Z8673 Personal history of transient ischemic attack (TIA), and cerebral infarction without residual deficits: Secondary | ICD-10-CM | POA: Diagnosis not present

## 2014-02-08 DIAGNOSIS — I10 Essential (primary) hypertension: Secondary | ICD-10-CM | POA: Diagnosis not present

## 2014-02-08 DIAGNOSIS — Z9181 History of falling: Secondary | ICD-10-CM | POA: Diagnosis not present

## 2014-02-08 DIAGNOSIS — I69922 Dysarthria following unspecified cerebrovascular disease: Secondary | ICD-10-CM | POA: Diagnosis not present

## 2014-02-08 DIAGNOSIS — IMO0001 Reserved for inherently not codable concepts without codable children: Secondary | ICD-10-CM | POA: Diagnosis not present

## 2014-02-08 DIAGNOSIS — M159 Polyosteoarthritis, unspecified: Secondary | ICD-10-CM | POA: Diagnosis not present

## 2014-02-10 NOTE — Telephone Encounter (Signed)
Letter mailed

## 2014-02-11 NOTE — Telephone Encounter (Signed)
Patient blood pressure has normalized.

## 2014-02-12 DIAGNOSIS — M159 Polyosteoarthritis, unspecified: Secondary | ICD-10-CM | POA: Diagnosis not present

## 2014-02-12 DIAGNOSIS — IMO0001 Reserved for inherently not codable concepts without codable children: Secondary | ICD-10-CM | POA: Diagnosis not present

## 2014-02-12 DIAGNOSIS — Z8673 Personal history of transient ischemic attack (TIA), and cerebral infarction without residual deficits: Secondary | ICD-10-CM | POA: Diagnosis not present

## 2014-02-12 DIAGNOSIS — I69922 Dysarthria following unspecified cerebrovascular disease: Secondary | ICD-10-CM | POA: Diagnosis not present

## 2014-02-12 DIAGNOSIS — Z9181 History of falling: Secondary | ICD-10-CM | POA: Diagnosis not present

## 2014-02-12 DIAGNOSIS — I10 Essential (primary) hypertension: Secondary | ICD-10-CM | POA: Diagnosis not present

## 2014-03-08 DIAGNOSIS — N3941 Urge incontinence: Secondary | ICD-10-CM | POA: Diagnosis not present

## 2014-03-08 DIAGNOSIS — R350 Frequency of micturition: Secondary | ICD-10-CM | POA: Diagnosis not present

## 2014-03-17 ENCOUNTER — Ambulatory Visit: Payer: Self-pay | Admitting: *Deleted

## 2014-03-17 DIAGNOSIS — N3020 Other chronic cystitis without hematuria: Secondary | ICD-10-CM | POA: Diagnosis not present

## 2014-03-17 DIAGNOSIS — I2699 Other pulmonary embolism without acute cor pulmonale: Secondary | ICD-10-CM

## 2014-03-17 DIAGNOSIS — N3941 Urge incontinence: Secondary | ICD-10-CM | POA: Diagnosis not present

## 2014-03-17 DIAGNOSIS — Z7901 Long term (current) use of anticoagulants: Secondary | ICD-10-CM

## 2014-03-29 DIAGNOSIS — N3941 Urge incontinence: Secondary | ICD-10-CM | POA: Diagnosis not present

## 2014-03-29 DIAGNOSIS — R350 Frequency of micturition: Secondary | ICD-10-CM | POA: Diagnosis not present

## 2014-03-31 DIAGNOSIS — N3020 Other chronic cystitis without hematuria: Secondary | ICD-10-CM | POA: Diagnosis not present

## 2014-03-31 DIAGNOSIS — N3941 Urge incontinence: Secondary | ICD-10-CM | POA: Diagnosis not present

## 2014-04-08 ENCOUNTER — Ambulatory Visit (INDEPENDENT_AMBULATORY_CARE_PROVIDER_SITE_OTHER): Payer: Medicare Other

## 2014-04-08 ENCOUNTER — Encounter (INDEPENDENT_AMBULATORY_CARE_PROVIDER_SITE_OTHER): Payer: Self-pay

## 2014-04-08 DIAGNOSIS — Z111 Encounter for screening for respiratory tuberculosis: Secondary | ICD-10-CM | POA: Diagnosis not present

## 2014-04-09 ENCOUNTER — Ambulatory Visit (INDEPENDENT_AMBULATORY_CARE_PROVIDER_SITE_OTHER): Payer: Medicare Other | Admitting: Cardiovascular Disease

## 2014-04-09 ENCOUNTER — Encounter: Payer: Self-pay | Admitting: Cardiovascular Disease

## 2014-04-09 VITALS — BP 108/64 | HR 55 | Ht 69.0 in | Wt 192.0 lb

## 2014-04-09 DIAGNOSIS — I255 Ischemic cardiomyopathy: Secondary | ICD-10-CM

## 2014-04-09 DIAGNOSIS — Z7901 Long term (current) use of anticoagulants: Secondary | ICD-10-CM

## 2014-04-09 DIAGNOSIS — I2510 Atherosclerotic heart disease of native coronary artery without angina pectoris: Secondary | ICD-10-CM | POA: Diagnosis not present

## 2014-04-09 DIAGNOSIS — I2589 Other forms of chronic ischemic heart disease: Secondary | ICD-10-CM | POA: Diagnosis not present

## 2014-04-09 DIAGNOSIS — I82409 Acute embolism and thrombosis of unspecified deep veins of unspecified lower extremity: Secondary | ICD-10-CM

## 2014-04-09 DIAGNOSIS — I10 Essential (primary) hypertension: Secondary | ICD-10-CM

## 2014-04-09 DIAGNOSIS — E785 Hyperlipidemia, unspecified: Secondary | ICD-10-CM

## 2014-04-09 DIAGNOSIS — I2699 Other pulmonary embolism without acute cor pulmonale: Secondary | ICD-10-CM

## 2014-04-09 MED ORDER — PRAVASTATIN SODIUM 80 MG PO TABS: 80.0000 mg | ORAL_TABLET | Freq: Every evening | ORAL | Status: DC

## 2014-04-09 NOTE — Patient Instructions (Addendum)
Your physician wants you to follow-up in: 1 year You will receive a reminder letter in the mail two months in advance. If you don't receive a letter, please call our office to schedule the follow-up appointment.   INCREASE Pravachol to 80 mg daily    Please get FASTING lab work in 3 months  (LIPID,LFT'S)     Thank you for choosing [facility] !
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
## 2014-04-26 DIAGNOSIS — N3941 Urge incontinence: Secondary | ICD-10-CM | POA: Diagnosis not present

## 2014-04-26 DIAGNOSIS — R350 Frequency of micturition: Secondary | ICD-10-CM | POA: Diagnosis not present

## 2014-05-06 ENCOUNTER — Other Ambulatory Visit: Payer: Self-pay | Admitting: Family Medicine

## 2014-05-10 ENCOUNTER — Encounter: Payer: Self-pay | Admitting: Family Medicine

## 2014-05-10 ENCOUNTER — Ambulatory Visit (INDEPENDENT_AMBULATORY_CARE_PROVIDER_SITE_OTHER): Payer: Medicare Other | Admitting: Family Medicine

## 2014-05-10 VITALS — BP 98/64 | HR 61 | Resp 16 | Ht 69.0 in | Wt 193.0 lb

## 2014-05-10 DIAGNOSIS — Z0000 Encounter for general adult medical examination without abnormal findings: Secondary | ICD-10-CM | POA: Diagnosis not present

## 2014-05-10 DIAGNOSIS — Z23 Encounter for immunization: Secondary | ICD-10-CM

## 2014-05-10 DIAGNOSIS — I2510 Atherosclerotic heart disease of native coronary artery without angina pectoris: Secondary | ICD-10-CM | POA: Diagnosis not present

## 2014-05-10 DIAGNOSIS — J302 Other seasonal allergic rhinitis: Secondary | ICD-10-CM

## 2014-05-10 LAB — LIPID PANEL
CHOL/HDL RATIO: 3.2 ratio
CHOLESTEROL: 136 mg/dL (ref 0–200)
HDL: 42 mg/dL (ref >39)
LDL Cholesterol: 77 mg/dL (ref 0–99)
TRIGLYCERIDES: 85 mg/dL (ref <150)
VLDL: 17 mg/dL (ref 0–40)

## 2014-05-10 LAB — HEPATIC FUNCTION PANEL
ALBUMIN: 4 g/dL (ref 3.5–5.2)
ALT: <8 U/L (ref 0–53)
AST: 9 U/L (ref 0–37)
Alkaline Phosphatase: 102 U/L (ref 39–117)
BILIRUBIN DIRECT: 0.1 mg/dL (ref 0.0–0.3)
Indirect Bilirubin: 0.4 mg/dL (ref 0.2–1.2)
Total Bilirubin: 0.5 mg/dL (ref 0.2–1.2)
Total Protein: 8 g/dL (ref 6.0–8.3)

## 2014-05-10 MED ORDER — FLUTICASONE PROPIONATE 50 MCG/ACT NA SUSP: 2.0000 | Freq: Every day | NASAL | Status: AC

## 2014-05-10 NOTE — Assessment & Plan Note (Signed)
Annual exam as documented. Counseling done  re healthy lifestyle involving commitment to 150 minutes exercise per week, heart healthy diet, and attaining healthy weight.The importance of adequate sleep also discussed. Regular seat belt use fall risk reduction, is also discussed. Changes in health habits are decided on by the patient with goals and time frames  set for achieving them. Immunization and cancer screening needs are specifically addressed at this visit.

## 2014-05-10 NOTE — Patient Instructions (Signed)
F/u in 4.5 month, call if you need me before  Please work on stopping smoking  If you really want  No ressuccitation in the event that you stop breathing , you need to get  A form signed and have it kept at the facility that you [facility]  I will send for recent note from [facility] in [place] to understand your treatment plan  Flu vaccine administered today

## 2014-05-10 NOTE — Progress Notes (Signed)
Subjective:    Patient ID: [name] [name], male    DOB: 29-Dec-1940, 73 y.o.   MRN: [id number]  HPI Pt in for a subsequent medicare wellness visit He c/o increased and uncontrolled allergies with nasal congestion and clear drainage.  Flonase is prescribed Flu vaccine is administered at visit, it is currently due .  Review of Systems See HPI     Objective:   Physical Exam  Preventive Screening-Counseling & Management   Patient present here today for a subsequent  Medicare annual wellness visit.   Current Problems (verified)   Medications Prior to Visit Allergies (verified)   PAST HISTORY  Family History - verified   Social History - not married, 8 children, currently lives in [facility] ongoing nicotine use   Risk Factors  Current exercise habits: confined to wheelchair. Staff gets him up daily to walk for three 5 min sessions as able  Dietary issues discussed: Heart healthy, limit fried fatty foods, eat more fruits and vegetables    Cardiac risk factors: Mother died of heart failure, brother died of MI at age 73   Depression Screen  (Note: if answer to either of the following is "Yes", a more complete depression screening is indicated)   Over the past two weeks, have you felt down, depressed or hopeless? No  Over the past two weeks, have you felt little interest or pleasure in doing things? No  Have you lost interest or pleasure in daily life? No  Do you often feel hopeless? No  Do you cry easily over simple problems? No   Activities of Daily Living  In your present state of health, do you have any difficulty performing the following activities?  Driving?: doesn't drive  Managing money?: sister manages for him  Feeding yourself?:No Getting from bed to chair?: needs assistance  Climbing a flight of stairs?:unable to do so Preparing food and eating?: facility prepares meals  Bathing or showering?:Needs assistance  Getting dressed?: needs assistance    Getting to the toilet?: needs assistance to get on commode, knows when he has the need for bM, gets assistance with clean up, relies on incontinence pull ups daily Using the toilet?:No Moving around from place to place?: uses wheelchair , over 95% of the time  Fall Risk Assessment In the past year have you fallen or had a near fall?:No, but at high risk, however facility staff use gait belt and he walks with supervision at all times, approx 3 times per day, approx 5 mins each time Are you currently taking any medications that make you dizzy?:No   Hearing Difficulties: No Do you often ask people to speak up or repeat themselves?:No Do you experience ringing or noises in your ears?:No Do you have difficulty understanding soft or whispered voices?:No  Cognitive Testing  Alert? Yes Normal Appearance?Yes  Oriented to person? Yes Place? Yes  Time? Yes  Displays appropriate judgment?Yes  Can read the correct time from a watch face? yes Are you having problems remembering things?No  Advanced Directives have been discussed with the patient?Yes, doesn't have one currently  , states he  Wants no cardiopulmonary ressucitation,I have sent a message that heneeds to have a DNR form completed and kept at his place of residence if this is indeed the case. I have requested that family accompany him to his AWV  in the past, but with no success List the Names of Other Physician/Practitioners you currently use:  [facility] Purvis Sheffield([place]war[place]) , also [facility]  in [place] any recent Medical Services you may have received from other than [facility] providers in the past year (date may be approximate).   Assessment:    Annual Wellness Exam   Plan:     Medicare Attestation  I have personally reviewed:  The patient's medical and social history  Their use of alcohol, tobacco or illicit drugs  Their current medications and supplements  The patient's functional ability including  ADLs,fall risks, home safety risks, cognitive, and hearing and visual impairment  Diet and physical activities  Evidence for depression or mood disorders  The patient's weight, height, BMI, and visual acuity have been recorded in the chart. I have made referrals, counseling, and provided education to the patient based on review of the above and I have provided the patient with a written personalized care plan for preventive services.         Assessment & Plan:  Medicare annual wellness visit, subsequent Annual exam as documented. Counseling done  re healthy lifestyle involving commitment to 150 minutes exercise per week, heart healthy diet, and attaining healthy weight.The importance of adequate sleep also discussed. Regular seat belt use fall risk reduction, is also discussed. Changes in health habits are decided on by the patient with goals and time frames  set for achieving them. Immunization and cancer screening needs are specifically addressed at this visit.   Need for prophylactic vaccination and inoculation against influenza Vaccine administered at visit.

## 2014-05-17 DIAGNOSIS — R350 Frequency of micturition: Secondary | ICD-10-CM | POA: Diagnosis not present

## 2014-05-17 DIAGNOSIS — N3941 Urge incontinence: Secondary | ICD-10-CM | POA: Diagnosis not present

## 2014-05-31 DIAGNOSIS — Z23 Encounter for immunization: Secondary | ICD-10-CM | POA: Insufficient documentation

## 2014-05-31 NOTE — Assessment & Plan Note (Signed)
Ongoing left hemiparesis. Pt notes deterioration in level of function due to progressive weakness PT/OT referral to help with this

## 2014-05-31 NOTE — Assessment & Plan Note (Signed)
Controlled, no change in medication  

## 2014-05-31 NOTE — Assessment & Plan Note (Signed)
C/o increased back pain trial of gabapentin

## 2014-05-31 NOTE — Assessment & Plan Note (Signed)
Improving, down to 3 per day Patient counseled for approximately 5 minutes regarding the health risks of ongoing nicotine use, specifically all types of cancer, heart disease, stroke and respiratory failure. The options available for help with cessation ,the behavioral changes to assist the process, and the option to either gradully reduce usage  Or abruptly stop.is also discussed. Pt is also encouraged to set specific goals in number of cigarettes used daily, as well as to set a quit date.

## 2014-05-31 NOTE — Assessment & Plan Note (Signed)
Vaccine administered at visit.  

## 2014-05-31 NOTE — Assessment & Plan Note (Signed)
Low HDL , otherwise at goal. No med change , increased physical activity , as able, encouraged

## 2014-05-31 NOTE — Assessment & Plan Note (Signed)
Level is low, pt advised to start weekly supplement

## 2014-06-07 DIAGNOSIS — N3941 Urge incontinence: Secondary | ICD-10-CM | POA: Diagnosis not present

## 2014-06-07 DIAGNOSIS — R350 Frequency of micturition: Secondary | ICD-10-CM | POA: Diagnosis not present

## 2014-06-07 LAB — TB SKIN TEST
INDURATION: 0 mm
TB Skin Test: NEGATIVE

## 2014-06-11 ENCOUNTER — Other Ambulatory Visit: Payer: Self-pay | Admitting: Family Medicine

## 2014-06-28 DIAGNOSIS — R350 Frequency of micturition: Secondary | ICD-10-CM | POA: Diagnosis not present

## 2014-06-28 DIAGNOSIS — N3941 Urge incontinence: Secondary | ICD-10-CM | POA: Diagnosis not present

## 2014-07-19 DIAGNOSIS — R350 Frequency of micturition: Secondary | ICD-10-CM | POA: Diagnosis not present

## 2014-07-19 DIAGNOSIS — N3941 Urge incontinence: Secondary | ICD-10-CM | POA: Diagnosis not present

## 2014-08-09 DIAGNOSIS — N3941 Urge incontinence: Secondary | ICD-10-CM | POA: Diagnosis not present

## 2014-08-16 ENCOUNTER — Other Ambulatory Visit: Payer: Self-pay | Admitting: Cardiovascular Disease

## 2014-08-16 MED ORDER — PRAVASTATIN SODIUM 80 MG PO TABS: 80.0000 mg | ORAL_TABLET | Freq: Every evening | ORAL | Status: AC

## 2014-08-16 NOTE — Telephone Encounter (Signed)
Received fax refill request  Rx # [id number] Medication:  Pravastatin Sodium 80 mg tablet Qty 30 Sig:  Take one tablet by mouth every evening Physician:  Purvis SheffieldKoneswaran

## 2014-08-24 ENCOUNTER — Emergency Department (HOSPITAL_COMMUNITY): Payer: Medicare Other

## 2014-08-24 ENCOUNTER — Encounter (HOSPITAL_COMMUNITY): Payer: Self-pay | Admitting: Emergency Medicine

## 2014-08-24 ENCOUNTER — Emergency Department (HOSPITAL_COMMUNITY)
Admission: EM | Admit: 2014-08-24 | Discharge: 2014-08-24 | Disposition: A | Discharge status: Home / Self Care | Payer: Medicare Other | Attending: Emergency Medicine | Admitting: Emergency Medicine

## 2014-08-24 DIAGNOSIS — Z7951 Long term (current) use of inhaled steroids: Secondary | ICD-10-CM | POA: Diagnosis not present

## 2014-08-24 DIAGNOSIS — E669 Obesity, unspecified: Secondary | ICD-10-CM | POA: Insufficient documentation

## 2014-08-24 DIAGNOSIS — Z8673 Personal history of transient ischemic attack (TIA), and cerebral infarction without residual deficits: Secondary | ICD-10-CM | POA: Insufficient documentation

## 2014-08-24 DIAGNOSIS — Z720 Tobacco use: Secondary | ICD-10-CM | POA: Diagnosis not present

## 2014-08-24 DIAGNOSIS — M79604 Pain in right leg: Secondary | ICD-10-CM

## 2014-08-24 DIAGNOSIS — I10 Essential (primary) hypertension: Secondary | ICD-10-CM | POA: Diagnosis not present

## 2014-08-24 DIAGNOSIS — Z86711 Personal history of pulmonary embolism: Secondary | ICD-10-CM | POA: Insufficient documentation

## 2014-08-24 DIAGNOSIS — Z87448 Personal history of other diseases of urinary system: Secondary | ICD-10-CM | POA: Insufficient documentation

## 2014-08-24 DIAGNOSIS — F419 Anxiety disorder, unspecified: Secondary | ICD-10-CM | POA: Insufficient documentation

## 2014-08-24 DIAGNOSIS — M25551 Pain in right hip: Secondary | ICD-10-CM | POA: Diagnosis not present

## 2014-08-24 DIAGNOSIS — Z79899 Other long term (current) drug therapy: Secondary | ICD-10-CM | POA: Diagnosis not present

## 2014-08-24 DIAGNOSIS — R4781 Slurred speech: Secondary | ICD-10-CM | POA: Diagnosis not present

## 2014-08-24 DIAGNOSIS — F329 Major depressive disorder, single episode, unspecified: Secondary | ICD-10-CM | POA: Insufficient documentation

## 2014-08-24 DIAGNOSIS — R0902 Hypoxemia: Secondary | ICD-10-CM | POA: Diagnosis not present

## 2014-08-24 DIAGNOSIS — E785 Hyperlipidemia, unspecified: Secondary | ICD-10-CM | POA: Insufficient documentation

## 2014-08-24 DIAGNOSIS — Z8719 Personal history of other diseases of the digestive system: Secondary | ICD-10-CM | POA: Insufficient documentation

## 2014-08-24 DIAGNOSIS — Z792 Long term (current) use of antibiotics: Secondary | ICD-10-CM | POA: Diagnosis not present

## 2014-08-24 DIAGNOSIS — M1990 Unspecified osteoarthritis, unspecified site: Secondary | ICD-10-CM | POA: Insufficient documentation

## 2014-08-24 DIAGNOSIS — Z7901 Long term (current) use of anticoagulants: Secondary | ICD-10-CM | POA: Insufficient documentation

## 2014-08-24 DIAGNOSIS — R52 Pain, unspecified: Secondary | ICD-10-CM

## 2014-08-24 DIAGNOSIS — Z7401 Bed confinement status: Secondary | ICD-10-CM | POA: Diagnosis not present

## 2014-08-24 DIAGNOSIS — E119 Type 2 diabetes mellitus without complications: Secondary | ICD-10-CM | POA: Diagnosis not present

## 2014-08-24 LAB — URINE MICROSCOPIC-ADD ON

## 2014-08-24 LAB — URINALYSIS, ROUTINE W REFLEX MICROSCOPIC
Bilirubin Urine: NEGATIVE
Glucose, UA: NEGATIVE mg/dL
KETONES UR: NEGATIVE mg/dL
NITRITE: NEGATIVE
PROTEIN: NEGATIVE mg/dL
SPECIFIC GRAVITY, URINE: 1.02 (ref 1.005–1.030)
UROBILINOGEN UA: 0.2 mg/dL (ref 0.0–1.0)
pH: 5 (ref 5.0–8.0)

## 2014-08-24 LAB — I-STAT CHEM 8, ED
BUN: 20 mg/dL (ref 6–23)
CHLORIDE: 106 meq/L (ref 96–112)
Calcium, Ion: 1.19 mmol/L (ref 1.13–1.30)
Creatinine, Ser: 1.2 mg/dL (ref 0.50–1.35)
Glucose, Bld: 100 mg/dL — ABNORMAL HIGH (ref 70–99)
HEMATOCRIT: 42 % (ref 39.0–52.0)
Hemoglobin: 14.3 g/dL (ref 13.0–17.0)
Potassium: 4.5 mmol/L (ref 3.5–5.1)
Sodium: 138 mmol/L (ref 135–145)
TCO2: 21 mmol/L (ref 0–100)

## 2014-08-24 MED ORDER — OXYCODONE-ACETAMINOPHEN 5-325 MG PO TABS
1.0000 | ORAL_TABLET | Freq: Once | ORAL | Status: AC
Start: 2014-08-24 — End: 2014-08-24
  Administered 2014-08-24: 1 via ORAL
  Filled 2014-08-24: qty 1

## 2014-08-24 MED ORDER — HYDROCODONE-ACETAMINOPHEN 5-325 MG PO TABS: 1.0000 | ORAL_TABLET | Freq: Four times a day (QID) | ORAL | Status: DC | PRN

## 2014-08-24 MED ORDER — METRONIDAZOLE 500 MG PO TABS
2000.0000 mg | ORAL_TABLET | Freq: Once | ORAL | Status: AC
  Administered 2014-08-24: 2000 mg via ORAL
  Filled 2014-08-24: qty 4

## 2014-08-24 NOTE — ED Notes (Signed)
Report called to assisted living facility.  Pt discharged with EMS to provide transport.
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
## 2014-08-24 NOTE — ED Notes (Signed)
Family expressing concern about pt being returned assisted living due to increased weakness.  EDP make aware of family's concerns.

## 2014-08-24 NOTE — ED Notes (Signed)
Pt comes in EMS with complaints of right leg pain onset 4pm, , pt has strong, equal bilateral grips, states pain in right is is some better.

## 2014-08-24 NOTE — Discharge Instructions (Signed)
Follow up with your md next week for recheck °

## 2014-08-25 ENCOUNTER — Emergency Department (HOSPITAL_COMMUNITY): Payer: Medicare Other

## 2014-08-25 ENCOUNTER — Telehealth: Payer: Self-pay | Admitting: Family Medicine

## 2014-08-25 ENCOUNTER — Emergency Department (HOSPITAL_COMMUNITY)
Admission: EM | Admit: 2014-08-25 | Discharge: 2014-08-26 | Disposition: A | Discharge status: Home / Self Care | Payer: Medicare Other | Attending: Emergency Medicine | Admitting: Emergency Medicine

## 2014-08-25 ENCOUNTER — Encounter (HOSPITAL_COMMUNITY): Payer: Self-pay

## 2014-08-25 DIAGNOSIS — F419 Anxiety disorder, unspecified: Secondary | ICD-10-CM | POA: Insufficient documentation

## 2014-08-25 DIAGNOSIS — Z79899 Other long term (current) drug therapy: Secondary | ICD-10-CM | POA: Diagnosis not present

## 2014-08-25 DIAGNOSIS — Z9889 Other specified postprocedural states: Secondary | ICD-10-CM | POA: Diagnosis not present

## 2014-08-25 DIAGNOSIS — R51 Headache: Secondary | ICD-10-CM | POA: Insufficient documentation

## 2014-08-25 DIAGNOSIS — I6789 Other cerebrovascular disease: Secondary | ICD-10-CM | POA: Diagnosis not present

## 2014-08-25 DIAGNOSIS — E785 Hyperlipidemia, unspecified: Secondary | ICD-10-CM | POA: Insufficient documentation

## 2014-08-25 DIAGNOSIS — R29810 Facial weakness: Secondary | ICD-10-CM | POA: Diagnosis not present

## 2014-08-25 DIAGNOSIS — Z87448 Personal history of other diseases of urinary system: Secondary | ICD-10-CM | POA: Diagnosis not present

## 2014-08-25 DIAGNOSIS — Z7951 Long term (current) use of inhaled steroids: Secondary | ICD-10-CM | POA: Diagnosis not present

## 2014-08-25 DIAGNOSIS — E669 Obesity, unspecified: Secondary | ICD-10-CM | POA: Diagnosis not present

## 2014-08-25 DIAGNOSIS — I10 Essential (primary) hypertension: Secondary | ICD-10-CM | POA: Insufficient documentation

## 2014-08-25 DIAGNOSIS — I2699 Other pulmonary embolism without acute cor pulmonale: Secondary | ICD-10-CM | POA: Diagnosis not present

## 2014-08-25 DIAGNOSIS — Z8673 Personal history of transient ischemic attack (TIA), and cerebral infarction without residual deficits: Secondary | ICD-10-CM | POA: Insufficient documentation

## 2014-08-25 DIAGNOSIS — Z792 Long term (current) use of antibiotics: Secondary | ICD-10-CM | POA: Insufficient documentation

## 2014-08-25 DIAGNOSIS — M79604 Pain in right leg: Secondary | ICD-10-CM | POA: Diagnosis not present

## 2014-08-25 DIAGNOSIS — M25551 Pain in right hip: Secondary | ICD-10-CM | POA: Insufficient documentation

## 2014-08-25 DIAGNOSIS — D68318 Other hemorrhagic disorder due to intrinsic circulating anticoagulants, antibodies, or inhibitors: Secondary | ICD-10-CM | POA: Diagnosis not present

## 2014-08-25 DIAGNOSIS — Z7901 Long term (current) use of anticoagulants: Secondary | ICD-10-CM | POA: Diagnosis not present

## 2014-08-25 DIAGNOSIS — F329 Major depressive disorder, single episode, unspecified: Secondary | ICD-10-CM | POA: Diagnosis not present

## 2014-08-25 DIAGNOSIS — R4781 Slurred speech: Secondary | ICD-10-CM | POA: Diagnosis not present

## 2014-08-25 DIAGNOSIS — Z720 Tobacco use: Secondary | ICD-10-CM | POA: Diagnosis not present

## 2014-08-25 HISTORY — DX: Type 2 diabetes mellitus without complications: E11.9

## 2014-08-25 LAB — URINE MICROSCOPIC-ADD ON

## 2014-08-25 LAB — CBC WITH DIFFERENTIAL/PLATELET
BASOS ABS: 0 10*3/uL (ref 0.0–0.1)
Basophils Relative: 0 % (ref 0–1)
EOS PCT: 1 % (ref 0–5)
Eosinophils Absolute: 0.1 10*3/uL (ref 0.0–0.7)
HEMATOCRIT: 40.4 % (ref 39.0–52.0)
Hemoglobin: 13.7 g/dL (ref 13.0–17.0)
LYMPHS ABS: 1.8 10*3/uL (ref 0.7–4.0)
LYMPHS PCT: 28 % (ref 12–46)
MCH: 31.9 pg (ref 26.0–34.0)
MCHC: 33.9 g/dL (ref 30.0–36.0)
MCV: 94.2 fL (ref 78.0–100.0)
Monocytes Absolute: 0.8 10*3/uL (ref 0.1–1.0)
Monocytes Relative: 12 % (ref 3–12)
NEUTROS ABS: 3.7 10*3/uL (ref 1.7–7.7)
Neutrophils Relative %: 58 % (ref 43–77)
Platelets: 114 10*3/uL — ABNORMAL LOW (ref 150–400)
RBC: 4.29 MIL/uL (ref 4.22–5.81)
RDW: 12.9 % (ref 11.5–15.5)
WBC: 6.4 10*3/uL (ref 4.0–10.5)

## 2014-08-25 LAB — BASIC METABOLIC PANEL
ANION GAP: 6 (ref 5–15)
BUN: 17 mg/dL (ref 6–23)
CHLORIDE: 105 meq/L (ref 96–112)
CO2: 23 mmol/L (ref 19–32)
Calcium: 9 mg/dL (ref 8.4–10.5)
Creatinine, Ser: 1.21 mg/dL (ref 0.50–1.35)
GFR, EST AFRICAN AMERICAN: 67 mL/min — AB (ref >90)
GFR, EST NON AFRICAN AMERICAN: 58 mL/min — AB (ref >90)
Glucose, Bld: 99 mg/dL (ref 70–99)
POTASSIUM: 4 mmol/L (ref 3.5–5.1)
SODIUM: 134 mmol/L — AB (ref 135–145)

## 2014-08-25 LAB — URINALYSIS, ROUTINE W REFLEX MICROSCOPIC
Bilirubin Urine: NEGATIVE
GLUCOSE, UA: NEGATIVE mg/dL
Ketones, ur: NEGATIVE mg/dL
Nitrite: NEGATIVE
PH: 5.5 (ref 5.0–8.0)
Protein, ur: NEGATIVE mg/dL
SPECIFIC GRAVITY, URINE: 1.015 (ref 1.005–1.030)
Urobilinogen, UA: 0.2 mg/dL (ref 0.0–1.0)

## 2014-08-25 MED ORDER — TAMSULOSIN HCL 0.4 MG PO CAPS
0.4000 mg | ORAL_CAPSULE | Freq: Every day | ORAL | Status: DC
  Administered 2014-08-25: 0.4 mg via ORAL
  Filled 2014-08-25: qty 1

## 2014-08-25 MED ORDER — CEPHALEXIN 250 MG PO CAPS
250.0000 mg | ORAL_CAPSULE | Freq: Every day | ORAL | Status: DC
  Administered 2014-08-25: 250 mg via ORAL
  Filled 2014-08-25 (×2): qty 1

## 2014-08-25 MED ORDER — CEPHALEXIN 250 MG PO CAPS
ORAL_CAPSULE | ORAL | Status: AC
Start: 2014-08-25 — End: 2014-08-25
  Filled 2014-08-25: qty 1

## 2014-08-25 MED ORDER — RIVAROXABAN 10 MG PO TABS
ORAL_TABLET | ORAL | Status: AC
  Filled 2014-08-25: qty 2

## 2014-08-25 MED ORDER — DARIFENACIN HYDROBROMIDE ER 7.5 MG PO TB24
15.0000 mg | ORAL_TABLET | Freq: Every day | ORAL | Status: DC
  Administered 2014-08-25 – 2014-08-26 (×2): 15 mg via ORAL
  Filled 2014-08-25 (×3): qty 1

## 2014-08-25 MED ORDER — CARVEDILOL 12.5 MG PO TABS
25.0000 mg | ORAL_TABLET | Freq: Two times a day (BID) | ORAL | Status: DC
  Administered 2014-08-25 – 2014-08-26 (×2): 25 mg via ORAL
  Filled 2014-08-25 (×4): qty 1

## 2014-08-25 MED ORDER — FLUTICASONE PROPIONATE 50 MCG/ACT NA SUSP
NASAL | Status: AC
  Filled 2014-08-25: qty 16

## 2014-08-25 MED ORDER — ISOSORBIDE MONONITRATE ER 30 MG PO TB24
30.0000 mg | ORAL_TABLET | Freq: Every day | ORAL | Status: DC
  Administered 2014-08-25 – 2014-08-26 (×2): 30 mg via ORAL
  Filled 2014-08-25 (×3): qty 1

## 2014-08-25 MED ORDER — DOCUSATE SODIUM 100 MG PO CAPS
100.0000 mg | ORAL_CAPSULE | Freq: Two times a day (BID) | ORAL | Status: DC
  Administered 2014-08-26: 100 mg via ORAL
  Filled 2014-08-25 (×4): qty 1

## 2014-08-25 MED ORDER — FLUTICASONE PROPIONATE 50 MCG/ACT NA SUSP
2.0000 | Freq: Every day | NASAL | Status: DC
  Administered 2014-08-25 – 2014-08-26 (×2): 2 via NASAL
  Filled 2014-08-25 (×2): qty 16

## 2014-08-25 MED ORDER — CITALOPRAM HYDROBROMIDE 20 MG PO TABS
ORAL_TABLET | ORAL | Status: AC
  Filled 2014-08-25: qty 2

## 2014-08-25 MED ORDER — RIVAROXABAN (XARELTO) EDUCATION KIT FOR DVT/PE PATIENTS
PACK | Freq: Every day | Status: DC
  Administered 2014-08-25: 20:00:00 via ORAL
  Filled 2014-08-25: qty 1

## 2014-08-25 MED ORDER — CARVEDILOL 12.5 MG PO TABS
ORAL_TABLET | ORAL | Status: AC
  Filled 2014-08-25: qty 2

## 2014-08-25 MED ORDER — CITALOPRAM HYDROBROMIDE 20 MG PO TABS
40.0000 mg | ORAL_TABLET | Freq: Every day | ORAL | Status: DC
  Administered 2014-08-25 – 2014-08-26 (×2): 40 mg via ORAL
  Filled 2014-08-25 (×3): qty 1

## 2014-08-25 MED ORDER — GABAPENTIN 100 MG PO CAPS: 100.0000 mg | ORAL_CAPSULE | Freq: Every day | ORAL | Status: DC

## 2014-08-25 MED ORDER — LISINOPRIL 10 MG PO TABS
20.0000 mg | ORAL_TABLET | Freq: Every day | ORAL | Status: DC
  Administered 2014-08-25 – 2014-08-26 (×2): 20 mg via ORAL
  Filled 2014-08-25 (×2): qty 2

## 2014-08-25 MED ORDER — CLOTRIMAZOLE 1 % EX CREA
TOPICAL_CREAM | Freq: Two times a day (BID) | CUTANEOUS | Status: DC
  Administered 2014-08-26: 1 via TOPICAL
  Filled 2014-08-25: qty 15

## 2014-08-25 MED ORDER — RIVAROXABAN 20 MG PO TABS
20.0000 mg | ORAL_TABLET | Freq: Every day | ORAL | Status: DC
  Administered 2014-08-25: 20 mg via ORAL
  Filled 2014-08-25 (×2): qty 1

## 2014-08-25 MED ORDER — FUROSEMIDE 40 MG PO TABS
40.0000 mg | ORAL_TABLET | Freq: Every day | ORAL | Status: DC
  Administered 2014-08-25 – 2014-08-26 (×2): 40 mg via ORAL
  Filled 2014-08-25 (×2): qty 1

## 2014-08-25 NOTE — ED Notes (Signed)
Paged hospitalist for Ivery QualeHobson Bryant at 15:07

## 2014-08-25 NOTE — ED Notes (Signed)
Changed patient's diaper and performed bedside cleaning. Placed pillow under pateint's left side to relieve pressure.

## 2014-08-25 NOTE — ED Notes (Signed)
Gave patient soft food meal tray as requested and approved by PA.

## 2014-08-25 NOTE — ED Notes (Signed)
Pt to be staying the night in the ED to wait for social work to try and find pt placement at another facility in the am
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
## 2014-08-25 NOTE — ED Notes (Signed)
Pt here from [facility] for evaluation of his right lower leg. States he was here yesterday for same

## 2014-08-25 NOTE — Clinical Social Work Note (Addendum)
CSW made aware of need for higher level of care. Will follow up in AM. Discussed with PA. Left voicemail for pt's sister/administrator at facility.   Derenda FennelKara Zareah Hunzeker, [place] [phone]

## 2014-08-25 NOTE — ED Notes (Signed)
Gave patient soft food meal tray.

## 2014-08-25 NOTE — ED Provider Notes (Addendum)
Medical screening examination/treatment/procedure(s) were conducted as a shared visit with non-physician practitioner(s) and myself.  I personally evaluated the patient during the encounter.   EKG Interpretation None       Date: 08/25/2014  Rate: 66  Rhythm: normal sinus rhythm  QRS Axis: left  Intervals: normal  ST/T Wave abnormalities: nonspecific ST/T changes  Conduction Disutrbances:left anterior fascicular block  Narrative Interpretation:   Old EKG Reviewed: none available  Prolonged QT interval as well.   Patient seen by me. Patient of the lungs to Dr. Lodema Hong for primary care. Patient states a group home. Patient here leg pain. However Dr. Lodema Hong says that family members are saying that the group home is incapable of taking care of him and patient will need full nursing home care. Evaluation here will be to make sure that there is no medical reason such as stroke. Patient will undergo a medical workup. Based on findings patient may very well require admission. This admission would be for nursing home placement. If there is no medical findings.    Vanetta Mulders, MD 08/25/14 1206  Results for orders placed or performed during the hospital encounter of 08/25/14  Basic metabolic panel  Result Value Ref Range   Sodium 134 (L) 135 - 145 mmol/L   Potassium 4.0 3.5 - 5.1 mmol/L   Chloride 105 96 - 112 mEq/L   CO2 23 19 - 32 mmol/L   Glucose, Bld 99 70 - 99 mg/dL   BUN 17 6 - 23 mg/dL   Creatinine, Ser 1.61 0.50 - 1.35 mg/dL   Calcium 9.0 8.4 - 09.6 mg/dL   GFR calc non Af Amer 58 (L) >90 mL/min   GFR calc Af Amer 67 (L) >90 mL/min   Anion gap 6 5 - 15  CBC with Differential  Result Value Ref Range   WBC 6.4 4.0 - 10.5 K/uL   RBC 4.29 4.22 - 5.81 MIL/uL   Hemoglobin 13.7 13.0 - 17.0 g/dL   HCT 04.5 40.9 - 81.1 %   MCV 94.2 78.0 - 100.0 fL   MCH 31.9 26.0 - 34.0 pg   MCHC 33.9 30.0 - 36.0 g/dL   RDW 91.4 78.2 - 95.6 %   Platelets 114 (L) 150 - 400 K/uL    Neutrophils Relative % 58 43 - 77 %   Neutro Abs 3.7 1.7 - 7.7 K/uL   Lymphocytes Relative 28 12 - 46 %   Lymphs Abs 1.8 0.7 - 4.0 K/uL   Monocytes Relative 12 3 - 12 %   Monocytes Absolute 0.8 0.1 - 1.0 K/uL   Eosinophils Relative 1 0 - 5 %   Eosinophils Absolute 0.1 0.0 - 0.7 K/uL   Basophils Relative 0 0 - 1 %   Basophils Absolute 0.0 0.0 - 0.1 K/uL  Urinalysis, Routine w reflex microscopic  Result Value Ref Range   Color, Urine YELLOW YELLOW   APPearance CLEAR CLEAR   Specific Gravity, Urine 1.015 1.005 - 1.030   pH 5.5 5.0 - 8.0   Glucose, UA NEGATIVE NEGATIVE mg/dL   Hgb urine dipstick TRACE (A) NEGATIVE   Bilirubin Urine NEGATIVE NEGATIVE   Ketones, ur NEGATIVE NEGATIVE mg/dL   Protein, ur NEGATIVE NEGATIVE mg/dL   Urobilinogen, UA 0.2 0.0 - 1.0 mg/dL   Nitrite NEGATIVE NEGATIVE   Leukocytes, UA MODERATE (A) NEGATIVE  Urine microscopic-add on  Result Value Ref Range   Squamous Epithelial / LPF RARE RARE   WBC, UA 7-10 <3 WBC/hpf   RBC /  HPF 0-2 <3 RBC/hpf   Bacteria, UA RARE RARE   Ct Head Wo Contrast  08/25/2014   CLINICAL DATA:  Facial weakness and right leg pain. No known injury. History of prior aneurysm clipping.  EXAM: CT HEAD WITHOUT CONTRAST  TECHNIQUE: Contiguous axial images were obtained from the base of the skull through the vertex without intravenous contrast.  COMPARISON:  11/30/2013  FINDINGS: Stable postoperative changes in the frontal region with aneurysm clips and extensive encephalomalacia. Stable ventriculomegaly and advanced periventricular white matter disease. No acute intracranial findings. No extra-axial fluid collections.  The maxillary sinuses are completely opacified chronically and the ethmoid air cells and sphenoid sinus are also opacified. The mastoid air cells and middle ear cavities are clear.  IMPRESSION: Remote postoperative changes with extensive frontal encephalomalacia.  Stable atrophy, ventriculomegaly and periventricular white matter  disease.  Chronic paranasal sinus disease.   Electronically Signed   By: Loralie Champagne M.D.   On: 08/25/2014 13:29   Dg Hip Unilat With Pelvis 1v Right  08/25/2014   CLINICAL DATA:  Acute right hip pain  EXAM: DG HIP W/ PELVIS 1V*R*  COMPARISON:  Film from the previous day  FINDINGS: Postsurgical changes are again noted in the proximal femurs bilaterally. There again noted changes consistent with healed fractures in the midshaft of the right femur. No acute fracture or dislocation is noted. The pelvic ring appears intact.  IMPRESSION: Chronic changes without acute abnormality. No changes noted when compared with the previous day.   Electronically Signed   By: Alcide Clever M.D.   On: 08/25/2014 12:31   Dg Hip Unilat With Pelvis Min 4 Views Right  08/24/2014   CLINICAL DATA:  Right hip pain for 3-1/2 hr, no known injury, known history of prior femoral fractures with fixation  EXAM: DG HIP W/ PELVIS 4+V*R*  COMPARISON:  None.  FINDINGS: Pelvic ring appears intact. A medullary rod is noted in the proximal right femur. Some dystrophic calcification is noted adjacent to the proximal aspect of the rod. No prior films are available for comparison. There is an area of cortical irregularity noted in the proximal femoral shaft which may represent an acute fracture about the medullary rod. A healed fracture is noted in the midshaft of the right femur. Femoral films may be helpful for further evaluation. Postoperative changes in the proximal left femur are noted as well. Degenerative changes of the lumbar spine are seen.  IMPRESSION: Postoperative changes in the proximal femurs bilaterally. There is suggestion of a mildly displaced fracture surrounding the femoral rod proximally just below the lesser trochanter. Femoral films are recommended for further evaluation.   Electronically Signed   By: Alcide Clever M.D.   On: 08/24/2014 19:53   Dg Femur, Min 2 Views Right  08/24/2014   CLINICAL DATA:  Pt c/o pain in Rt hip  since 4pm tonight. Rt femur surgery in 1979. No injury. Femur films recommended after hips films done tonight.  EXAM: DG FEMUR 2+V*R*  COMPARISON:  None.  FINDINGS: Apparent fracture suggested on the hip thumbs was an artifact of projection. There is a well-healed fracture of the femur extending from the medial proximal shaft to the distal shaft. An intra medullary rod spans the femur from the greater trochanter to the distal metaphysis, well seated without evidence of loosening.  No bone lesion. Hip and knee joints are normally aligned. There are advanced degenerative changes of the right knee joint.  Vascular calcifications are noted medially and posteriorly.  IMPRESSION: Well  healed fracture of the right femur. No evidence of an acute fracture. No dislocation.   Electronically Signed   By: Amie Portlandavid  Ormond M.D.   On: 08/24/2014 20:56    Questionable urinary tract infection but no bacteria urine culture sent. Patient is not capable of staying in the current group home environment his care needs her beyond that and probably needs full-service nursing home care. We'll discuss with [facility] about making arrangements to get him admitted.   Vanetta MuldersScott Brysten Reister, MD 08/25/14 1414

## 2014-08-25 NOTE — Telephone Encounter (Signed)
pls call and let family know I have spoken to ED doc explaining the situation and if he is inacapble of movement and unable too keep him there then things should go through as requestedl

## 2014-08-26 ENCOUNTER — Encounter (HOSPITAL_COMMUNITY): Payer: Self-pay | Admitting: Emergency Medicine

## 2014-08-26 DIAGNOSIS — M25551 Pain in right hip: Secondary | ICD-10-CM | POA: Diagnosis not present

## 2014-08-26 DIAGNOSIS — Z7401 Bed confinement status: Secondary | ICD-10-CM | POA: Diagnosis not present

## 2014-08-26 DIAGNOSIS — F29 Unspecified psychosis not due to a substance or known physiological condition: Secondary | ICD-10-CM | POA: Diagnosis not present

## 2014-08-26 LAB — CBG MONITORING, ED: GLUCOSE-CAPILLARY: 117 mg/dL — AB (ref 70–99)

## 2014-08-26 MED ORDER — HYDROCODONE-ACETAMINOPHEN 5-325 MG PO TABS: 1.0000 | ORAL_TABLET | ORAL | Status: DC | PRN

## 2014-08-26 NOTE — ED Notes (Signed)
Attempted to call report; no one available to give report , nurse to call back.

## 2014-08-26 NOTE — ED Notes (Signed)
Pt morning medication not stocked in pyxis.

## 2014-08-26 NOTE — ED Notes (Signed)
Social Work at bedside. EDP aware.

## 2014-08-26 NOTE — ED Notes (Signed)
Flonase not stocked with medications in pyxis. Pharmacy aware and reported would send dose.

## 2014-08-26 NOTE — ED Provider Notes (Signed)
Pt stable in the ED and will be placed in nursing facility BP 113/72 mmHg  Pulse 72  Temp(Src) 97.4 F (36.3 C) (Oral)  Resp 20  SpO2 97% i was asked to complete a vicodin prescription   Joya Gaskinsonald W Saveah Bahar, MD 08/26/14 1335

## 2014-08-26 NOTE — ED Notes (Signed)
[facility]

## 2014-08-26 NOTE — Clinical Social Work Note (Signed)
CSW advised patient that a bed offer had been made from [facility].  Patient accepted the bed offer.    CSW spoke with Pietro CassisLinda Gay and advised that patient had been offered a bed at [facility].  CSW left a message on the voicemail of [facility] and advised that patient would need his wheelchair and walker taken to [facility].   CSW contacted [facility] to arrange transport.  CSW facilitated discharge.    Tretha SciaraHeather Saudia Smyser, [place] [phone]

## 2014-08-26 NOTE — Clinical Social Work Placement (Signed)
Clinical Social Work Department CLINICAL SOCIAL WORK PLACEMENT NOTE 08/26/2014  Patient:  [name] [name],[name] [name]  Account Number:  [id number] Admit date:  08/25/2014  Clinical Social Worker:  Tretha SciaraHEATHER Aleila Syverson, LCSW  Date/time:  08/26/2014 11:26 AM  Clinical Social Work is seeking post-discharge placement for this patient at the following level of care:   SKILLED NURSING   (*CSW will update this form in Epic as items are completed)   08/26/2014  Patient/family provided with [facility] Department of Clinical Social Work's list of facilities offering this level of care within the geographic area requested by the patient (or if unable, by the patient's family).  08/26/2014  Patient/family informed of their freedom to choose among providers that offer the needed level of care, that participate in Medicare, Medicaid or managed care program needed by the patient, have an available bed and are willing to accept the patient.  08/26/2014  Patient/family informed of [facility]' ownership interest in [facility], as well as of the fact that they are under no obligation to receive care at this facility.  PASARR submitted to EDS on  PASARR number received on   FL2 transmitted to all facilities in geographic area requested by pt/family on  08/26/2014 FL2 transmitted to all facilities within larger geographic area on 08/26/2014  Patient informed that his/her managed care company has contracts with or will negotiate with  certain facilities, including the following:     Patient/family informed of bed offers received:   Patient chooses bed at  Physician recommends and patient chooses bed at    Patient to be transferred to  on   Patient to be transferred to facility by  Patient and family notified of transfer on  Name of family member notified:    The following physician request were entered in Epic:   Additional Comments:  Tretha SciaraHeather Raistlin Gum, LCSW [phone]

## 2014-08-26 NOTE — Clinical Social Work Psychosocial (Signed)
Clinical Social Work Department BRIEF PSYCHOSOCIAL ASSESSMENT 08/26/2014  Patient:  [name] [name], [name] [name]     Account Number:  [id number]     Admit date:  08/25/2014  Clinical Social Worker:  Legrand Como  Date/Time:  08/26/2014 11:11 AM  Referred by:  CSW  Date Referred:  08/26/2014 Referred for  SNF Placement   Other Referral:   Interview type:  Patient Other interview type:    PSYCHOSOCIAL DATA Living Status:  FACILITY Admitted from facility:  Other Level of care:  Family Care Home Primary support name:  Hubbard Hartshorn Primary support relationship to patient:  Nathalie Degree of support available:   Patient reports that sister is very supportive.    CURRENT CONCERNS Current Concerns  Other - See comment   Other Concerns:   Patient is in need of a higher level of care than his current placement at the family care home.    SOCIAL WORK ASSESSMENT / PLAN CSW met with patient who was alert and oriented. Patient had impaired speech which made him difficult to understand. Patient reported that he has been in his current placement for the past four years.  He stated that he needs assistance with ADL's.  He stated that he uses a walker to ambulate.  Patient reported that his sister Hubbard Hartshorn, was his greatest support.  He stated that she works at his current facility.  Patient stated that he would like to stay at his current placement.  Patient agreed that he may need a higher level of care.  CSW provided patient with a SNF listing. CSW reviewed the list with patient.  CSW discussed with patient that it would be necessary for his clinicals to be sent to places in and out of the county due to his payer being Medicaid.  Patient gave permission for CSW to fax his clinicals to potential facilities.  CSW left messages for patient's sister, Hubbard Hartshorn, requesting return contact.  CSW faxed patient's clinicals out to [facility] [place] [place], [facility], [place], [place],  [place], [place].   Assessment/plan status:  Information/Referral to [employer] Other assessment/ plan:   Information/referral to community resources:    PATIENT'S/FAMILY'S RESPONSE TO PLAN OF CARE: Patient agreeable to go to SNF.    Ambrose Pancoast, [place]

## 2014-08-26 NOTE — ED Notes (Signed)
Pt cbg 117.

## 2014-08-26 NOTE — Discharge Instructions (Signed)
Anticoagulation, Generic  Anticoagulants are medicines used to prevent clots from developing in your veins. These medicine are also known as blood thinners. If blood clots are untreated, they could travel to your lungs. This is called a pulmonary embolus. A blood clot in your lungs can be fatal.   Health care providers often use anticoagulants to prevent clots following surgery. Anticoagulants are also used along with aspirin when the heart is not getting enough blood.  Another anticoagulant called warfarin is started 2 to 3 days after a rapid-acting injectable anticoagulant is started. The rapid-acting anticoagulants are usually continued until warfarin has begun to work. Your health care provider will judge this length of time by blood tests known as the prothrombin time (PT) and International Normalization Ratio (INR). This means that your blood is at the necessary and best level to prevent clots.  RISKS AND COMPLICATIONS  · If you have received recent epidural anesthesia, spinal anesthesia, or a spinal tap while receiving anticoagulants, you are at risk for developing a blood clot in or around the spine. This condition could result in long-term or permanent paralysis.  · Because anticoagulants thin your blood, severe bleeding may occur from any tissue or organ. Symptoms of the blood being too thin may include:  ¨ Bleeding from the nose or gums that does not stop quickly.  ¨ Blood in bowel movements which may appear as bright red, dark, or black tarry stools.  ¨ Blood in the urine which may appear as pink, red, or brown urine.  ¨ Unusual bruising or bruising easily.  ¨ A cut that does not stop bleeding within 10 minutes.  ¨ Vomiting blood or continuous nausea for more than 1 day.  ¨ Coughing up blood.  ¨ Broken blood vessels in your eye (subconjunctival hemorrhage).  ¨ Abdominal or back pain with or without flank bruising.  ¨ Sudden, severe headache.  ¨ Sudden weakness or numbness of the face, arm, or leg,  especially on one side of the body.  ¨ Sudden confusion.  ¨ Trouble speaking (aphasia) or understanding.  ¨ Sudden trouble seeing in one or both eyes.  ¨ Sudden trouble walking.  ¨ Dizziness.  ¨ Loss of balance or coordination.  ¨ Vaginal bleeding.  ¨ Swelling or pain at an injection site.  ¨ Superficial fat tissue death (necrosis) which may cause skin scarring. This is more common in women and may first present as pain in the waist, thighs, or buttocks.  ¨ Fever.  · Too little anticoagulation continues to allow the risk for blood clots.  HOME CARE INSTRUCTIONS   · Due to the complications of anticoagulants, it is very important that you take your anticoagulant as directed by your health care provider. Anticoagulants need to be taken exactly as instructed. Be sure you understand all your anticoagulant instructions.  · Keep all follow-up appointments with your health care provider as directed. It is very important to keep your appointments. Not keeping appointments could result in a chronic or permanent injury, pain, or disability.  · Warfarin. Your health care provider will advise you on the length of treatment (usually 3-6 months, sometimes lifelong).  ¨ Take warfarin exactly as directed by your health care provider. It is recommended that you take your warfarin dose at the same time of the day. It is preferred that you take warfarin in the late afternoon. If you have been told to stop taking warfarin, do not resume taking warfarin until directed to do so by your health care   provider. Follow your health care provider's instructions if you accidentally take an extra dose or miss a dose of warfarin. It is very important to take warfarin as directed since bleeding or blood clots could result in chronic or permanent injury, pain, or disability.  ¨ Too much and too little warfarin are both dangerous. Too much warfarin increases the risk of bleeding. Too little warfarin continues to allow the risk for blood clots. While  taking warfarin, you will need to have regular blood tests to measure your blood clotting time. These blood tests usually include both the prothrombin time (PT) and International Normalized Ratio (INR) tests. The PT and INR results allow your health care provider to adjust your dose of warfarin. The dose can change for many reasons. It is critically important that you have your PT and INR levels drawn exactly as directed. Your warfarin dose may stay the same or change depending on what the PT and INR results are. Be sure to follow up with your health care provider regarding your PT and INR test results and what your warfarin dosage should be.  ¨ Many medicines can interfere with warfarin and affect the PT and INR results. You must tell your health care provider about any and all medicines you take, this includes all vitamins and supplements. Ask your health care provider before taking these. Prescription and over-the-counter medicine consistency is critical to warfarin management. It is important that potential interactions are checked before you start a new medicine. Be especially cautious with aspirin and anti-inflammatory medicines. Ask your health care provider before taking these. Medicines such as antibiotics and acid-reducing medicine can interact with warfarin and can cause an increased warfarin effect. Warfarin can also interfere with the effectiveness of medicines you are taking. Do not take or discontinue any prescribed or over-the-counter medicine except on the advice of your health care provider or pharmacist.  ¨ Some vitamins, supplements, and herbal products interfere with the effectiveness of warfarin. Vitamin E may increase the anticoagulant effects of warfarin. Vitamin K may can cause warfarin to be less effective. Do not take or discontinue any vitamin, supplement, or herbal product except on the advice of your health care provider or pharmacist.  ¨ Eat what you normally eat and keep the vitamin K  content of your diet consistent. Avoid major changes in your diet, or notify your health care provider before changing your diet. Suddenly getting a lot more vitamin K could cause your blood to clot too quickly. A sudden decrease in vitamin K intake could cause your blood to clot too slowly. These changes in vitamin K intake could lead to dangerous blood clots or to bleeding. To keep your vitamin K intake consistent, you must be aware of which foods contain moderate or high amounts of vitamin K. Some foods high in vitamin K include spinach, kale, broccoli, cabbage, greens, Brussels sprouts, asparagus, Bok [place]oy, coleslaw, parsley, and green tea. Arrange a visit with a dietitian to answer your questions.  ¨ If you have a loss of appetite or get the stomach flu (viral gastroenteritis), talk to your health care provider as soon as possible. A decrease in your normal vitamin K intake can make you more sensitive to your usual dose of warfarin.  ¨ Some medical conditions may increase your risk for bleeding while you are taking warfarin. A fever, diarrhea lasting more than a day, worsening heart failure, or worsening liver function are some medical conditions that could affect warfarin. Contact your health care provider if   you have any of these medical conditions.  ¨ Alcohol can change the body's ability to handle warfarin. It is best to avoid alcoholic drinks or consume only very small amounts while taking warfarin. Notify your health care provider if you change your alcohol intake. A sudden increase in alcohol use can increase your risk of bleeding. Chronic alcohol use can cause warfarin to be less effective.  · Be careful not to cut yourself when using sharp objects or while shaving.  · Inform all your health care providers and your dentist that you take an anticoagulant.  · Limit physical activities or sports that could result in a fall or cause injury. Avoid contact sports.  · Wear medical alert jewelry or carry a  medical alert card.  SEEK IMMEDIATE MEDICAL CARE IF:  · You cough up blood.  · You have dark or black stools or there is bright red blood coming from your rectum.  · You vomit blood or have nausea for more than 1 day.  · You have blood in the urine or pink colored urine.  · You have unusual bruising or have increased bruising.  · You have bleeding from the nose or gums that does not stop quickly.  · You have a cut that does not stop bleeding within a 2-3 minutes.  · You have sudden weakness or numbness of the face, arm, or leg, especially on one side of the body.  · You have sudden confusion.  · You have trouble speaking (aphasia) or understanding.  · You have sudden trouble seeing in one or both eyes.  · You have sudden trouble walking.  · You have dizziness.  · You have a loss of balance or coordination.  · You have a sudden, severe headache.  · You have a serious fall or head injury, even if you are not bleeding.  · You have swelling or pain at an injection site.  · You have unexplained tenderness or pain in the abdomen, back, waist, thighs or buttocks.  · You have a fever.  Any of these symptoms may represent a serious problem that is an emergency. Do not wait to see if the symptoms will go away. Get medical help right away. Call your local emergency services (911 in [place] Do not drive yourself to the hospital.  Document Released: 07/23/2005 Document Revised: 07/28/2013 Document Reviewed: 02/25/2008  [employer]® Patient Information ©2015 [employer]. This information is not intended to replace advice given to you by your health care provider. Make sure you discuss any questions you have with your health care provider.

## 2014-08-27 DIAGNOSIS — N453 Epididymo-orchitis: Secondary | ICD-10-CM | POA: Diagnosis not present

## 2014-08-27 DIAGNOSIS — R471 Dysarthria and anarthria: Secondary | ICD-10-CM | POA: Diagnosis not present

## 2014-08-27 DIAGNOSIS — I82409 Acute embolism and thrombosis of unspecified deep veins of unspecified lower extremity: Secondary | ICD-10-CM | POA: Diagnosis not present

## 2014-08-27 DIAGNOSIS — E139 Other specified diabetes mellitus without complications: Secondary | ICD-10-CM | POA: Diagnosis not present

## 2014-08-27 DIAGNOSIS — R262 Difficulty in walking, not elsewhere classified: Secondary | ICD-10-CM | POA: Diagnosis not present

## 2014-08-27 DIAGNOSIS — F341 Dysthymic disorder: Secondary | ICD-10-CM | POA: Diagnosis not present

## 2014-08-27 DIAGNOSIS — M6281 Muscle weakness (generalized): Secondary | ICD-10-CM | POA: Diagnosis not present

## 2014-08-27 DIAGNOSIS — I679 Cerebrovascular disease, unspecified: Secondary | ICD-10-CM | POA: Diagnosis not present

## 2014-08-27 DIAGNOSIS — R1312 Dysphagia, oropharyngeal phase: Secondary | ICD-10-CM | POA: Diagnosis not present

## 2014-08-27 LAB — URINE CULTURE
Colony Count: NO GROWTH
Culture: NO GROWTH

## 2014-08-30 DIAGNOSIS — I679 Cerebrovascular disease, unspecified: Secondary | ICD-10-CM | POA: Diagnosis not present

## 2014-08-30 DIAGNOSIS — E46 Unspecified protein-calorie malnutrition: Secondary | ICD-10-CM | POA: Diagnosis not present

## 2014-08-30 DIAGNOSIS — N183 Chronic kidney disease, stage 3 (moderate): Secondary | ICD-10-CM | POA: Diagnosis not present

## 2014-08-30 DIAGNOSIS — I82409 Acute embolism and thrombosis of unspecified deep veins of unspecified lower extremity: Secondary | ICD-10-CM | POA: Diagnosis not present

## 2014-08-30 DIAGNOSIS — M6281 Muscle weakness (generalized): Secondary | ICD-10-CM | POA: Diagnosis not present

## 2014-08-30 DIAGNOSIS — R262 Difficulty in walking, not elsewhere classified: Secondary | ICD-10-CM | POA: Diagnosis not present

## 2014-08-30 DIAGNOSIS — R471 Dysarthria and anarthria: Secondary | ICD-10-CM | POA: Diagnosis not present

## 2014-08-30 DIAGNOSIS — E538 Deficiency of other specified B group vitamins: Secondary | ICD-10-CM | POA: Diagnosis not present

## 2014-08-30 DIAGNOSIS — R1312 Dysphagia, oropharyngeal phase: Secondary | ICD-10-CM | POA: Diagnosis not present

## 2014-08-31 DIAGNOSIS — I679 Cerebrovascular disease, unspecified: Secondary | ICD-10-CM | POA: Diagnosis not present

## 2014-08-31 DIAGNOSIS — R471 Dysarthria and anarthria: Secondary | ICD-10-CM | POA: Diagnosis not present

## 2014-08-31 DIAGNOSIS — M6281 Muscle weakness (generalized): Secondary | ICD-10-CM | POA: Diagnosis not present

## 2014-08-31 DIAGNOSIS — R1312 Dysphagia, oropharyngeal phase: Secondary | ICD-10-CM | POA: Diagnosis not present

## 2014-08-31 DIAGNOSIS — R262 Difficulty in walking, not elsewhere classified: Secondary | ICD-10-CM | POA: Diagnosis not present

## 2014-09-01 DIAGNOSIS — R262 Difficulty in walking, not elsewhere classified: Secondary | ICD-10-CM | POA: Diagnosis not present

## 2014-09-01 DIAGNOSIS — I679 Cerebrovascular disease, unspecified: Secondary | ICD-10-CM | POA: Diagnosis not present

## 2014-09-01 DIAGNOSIS — R1312 Dysphagia, oropharyngeal phase: Secondary | ICD-10-CM | POA: Diagnosis not present

## 2014-09-01 DIAGNOSIS — M6281 Muscle weakness (generalized): Secondary | ICD-10-CM | POA: Diagnosis not present

## 2014-09-01 DIAGNOSIS — R471 Dysarthria and anarthria: Secondary | ICD-10-CM | POA: Diagnosis not present

## 2014-09-02 DIAGNOSIS — R1312 Dysphagia, oropharyngeal phase: Secondary | ICD-10-CM | POA: Diagnosis not present

## 2014-09-02 DIAGNOSIS — R262 Difficulty in walking, not elsewhere classified: Secondary | ICD-10-CM | POA: Diagnosis not present

## 2014-09-02 DIAGNOSIS — R471 Dysarthria and anarthria: Secondary | ICD-10-CM | POA: Diagnosis not present

## 2014-09-02 DIAGNOSIS — M6281 Muscle weakness (generalized): Secondary | ICD-10-CM | POA: Diagnosis not present

## 2014-09-02 DIAGNOSIS — I679 Cerebrovascular disease, unspecified: Secondary | ICD-10-CM | POA: Diagnosis not present

## 2014-09-03 DIAGNOSIS — R1312 Dysphagia, oropharyngeal phase: Secondary | ICD-10-CM | POA: Diagnosis not present

## 2014-09-03 DIAGNOSIS — M6281 Muscle weakness (generalized): Secondary | ICD-10-CM | POA: Diagnosis not present

## 2014-09-03 DIAGNOSIS — R471 Dysarthria and anarthria: Secondary | ICD-10-CM | POA: Diagnosis not present

## 2014-09-03 DIAGNOSIS — R262 Difficulty in walking, not elsewhere classified: Secondary | ICD-10-CM | POA: Diagnosis not present

## 2014-09-03 DIAGNOSIS — I679 Cerebrovascular disease, unspecified: Secondary | ICD-10-CM | POA: Diagnosis not present

## 2014-09-04 DIAGNOSIS — R471 Dysarthria and anarthria: Secondary | ICD-10-CM | POA: Diagnosis not present

## 2014-09-04 DIAGNOSIS — M6281 Muscle weakness (generalized): Secondary | ICD-10-CM | POA: Diagnosis not present

## 2014-09-04 DIAGNOSIS — I679 Cerebrovascular disease, unspecified: Secondary | ICD-10-CM | POA: Diagnosis not present

## 2014-09-04 DIAGNOSIS — R262 Difficulty in walking, not elsewhere classified: Secondary | ICD-10-CM | POA: Diagnosis not present

## 2014-09-04 DIAGNOSIS — R1312 Dysphagia, oropharyngeal phase: Secondary | ICD-10-CM | POA: Diagnosis not present

## 2014-09-06 DIAGNOSIS — M6281 Muscle weakness (generalized): Secondary | ICD-10-CM | POA: Diagnosis not present

## 2014-09-06 DIAGNOSIS — R1312 Dysphagia, oropharyngeal phase: Secondary | ICD-10-CM | POA: Diagnosis not present

## 2014-09-06 DIAGNOSIS — R262 Difficulty in walking, not elsewhere classified: Secondary | ICD-10-CM | POA: Diagnosis not present

## 2014-09-06 DIAGNOSIS — R471 Dysarthria and anarthria: Secondary | ICD-10-CM | POA: Diagnosis not present

## 2014-09-06 DIAGNOSIS — I679 Cerebrovascular disease, unspecified: Secondary | ICD-10-CM | POA: Diagnosis not present

## 2014-09-07 DIAGNOSIS — R1310 Dysphagia, unspecified: Secondary | ICD-10-CM | POA: Diagnosis not present

## 2014-09-07 DIAGNOSIS — M25559 Pain in unspecified hip: Secondary | ICD-10-CM | POA: Diagnosis not present

## 2014-09-14 DIAGNOSIS — J3489 Other specified disorders of nose and nasal sinuses: Secondary | ICD-10-CM | POA: Diagnosis not present

## 2014-09-23 DIAGNOSIS — M25559 Pain in unspecified hip: Secondary | ICD-10-CM | POA: Diagnosis not present

## 2014-09-23 DIAGNOSIS — W19XXXA Unspecified fall, initial encounter: Secondary | ICD-10-CM | POA: Diagnosis not present

## 2014-10-01 DIAGNOSIS — M79609 Pain in unspecified limb: Secondary | ICD-10-CM | POA: Diagnosis not present

## 2014-10-02 DIAGNOSIS — M1711 Unilateral primary osteoarthritis, right knee: Secondary | ICD-10-CM | POA: Diagnosis not present

## 2014-10-02 DIAGNOSIS — M79606 Pain in leg, unspecified: Secondary | ICD-10-CM | POA: Diagnosis not present

## 2014-10-03 DIAGNOSIS — N183 Chronic kidney disease, stage 3 (moderate): Secondary | ICD-10-CM | POA: Diagnosis not present

## 2014-10-03 DIAGNOSIS — M79609 Pain in unspecified limb: Secondary | ICD-10-CM | POA: Diagnosis not present

## 2014-10-03 DIAGNOSIS — I82419 Acute embolism and thrombosis of unspecified femoral vein: Secondary | ICD-10-CM | POA: Diagnosis not present

## 2014-10-03 DIAGNOSIS — Z79899 Other long term (current) drug therapy: Secondary | ICD-10-CM | POA: Diagnosis not present

## 2014-10-03 DIAGNOSIS — D68318 Other hemorrhagic disorder due to intrinsic circulating anticoagulants, antibodies, or inhibitors: Secondary | ICD-10-CM | POA: Diagnosis not present

## 2014-10-03 DIAGNOSIS — D649 Anemia, unspecified: Secondary | ICD-10-CM | POA: Diagnosis not present

## 2014-10-03 DIAGNOSIS — E871 Hypo-osmolality and hyponatremia: Secondary | ICD-10-CM | POA: Diagnosis not present

## 2014-10-03 DIAGNOSIS — I2699 Other pulmonary embolism without acute cor pulmonale: Secondary | ICD-10-CM | POA: Diagnosis not present

## 2014-10-03 DIAGNOSIS — Z7901 Long term (current) use of anticoagulants: Secondary | ICD-10-CM | POA: Diagnosis not present

## 2014-10-05 ENCOUNTER — Inpatient Hospital Stay (HOSPITAL_COMMUNITY)
Admission: EM | Admit: 2014-10-05 | Discharge: 2014-10-11 | DRG: 853 | Disposition: A | Discharge status: Skilled Nursing Facility | Payer: Medicare Other | Attending: Internal Medicine | Admitting: Internal Medicine

## 2014-10-05 ENCOUNTER — Encounter (HOSPITAL_COMMUNITY): Payer: Self-pay

## 2014-10-05 ENCOUNTER — Emergency Department (HOSPITAL_COMMUNITY): Payer: Medicare Other

## 2014-10-05 DIAGNOSIS — E871 Hypo-osmolality and hyponatremia: Secondary | ICD-10-CM | POA: Diagnosis not present

## 2014-10-05 DIAGNOSIS — I4891 Unspecified atrial fibrillation: Secondary | ICD-10-CM | POA: Diagnosis present

## 2014-10-05 DIAGNOSIS — Z79891 Long term (current) use of opiate analgesic: Secondary | ICD-10-CM | POA: Diagnosis not present

## 2014-10-05 DIAGNOSIS — Z9981 Dependence on supplemental oxygen: Secondary | ICD-10-CM | POA: Diagnosis not present

## 2014-10-05 DIAGNOSIS — Z96642 Presence of left artificial hip joint: Secondary | ICD-10-CM | POA: Diagnosis present

## 2014-10-05 DIAGNOSIS — Z86718 Personal history of other venous thrombosis and embolism: Secondary | ICD-10-CM | POA: Diagnosis not present

## 2014-10-05 DIAGNOSIS — E861 Hypovolemia: Secondary | ICD-10-CM | POA: Diagnosis present

## 2014-10-05 DIAGNOSIS — D649 Anemia, unspecified: Secondary | ICD-10-CM | POA: Diagnosis present

## 2014-10-05 DIAGNOSIS — A419 Sepsis, unspecified organism: Principal | ICD-10-CM | POA: Diagnosis present

## 2014-10-05 DIAGNOSIS — I739 Peripheral vascular disease, unspecified: Secondary | ICD-10-CM | POA: Diagnosis present

## 2014-10-05 DIAGNOSIS — R401 Stupor: Secondary | ICD-10-CM | POA: Diagnosis not present

## 2014-10-05 DIAGNOSIS — I96 Gangrene, not elsewhere classified: Secondary | ICD-10-CM | POA: Diagnosis present

## 2014-10-05 DIAGNOSIS — R6520 Severe sepsis without septic shock: Secondary | ICD-10-CM | POA: Diagnosis not present

## 2014-10-05 DIAGNOSIS — I2510 Atherosclerotic heart disease of native coronary artery without angina pectoris: Secondary | ICD-10-CM | POA: Diagnosis present

## 2014-10-05 DIAGNOSIS — N170 Acute kidney failure with tubular necrosis: Secondary | ICD-10-CM | POA: Diagnosis present

## 2014-10-05 DIAGNOSIS — R4182 Altered mental status, unspecified: Secondary | ICD-10-CM | POA: Diagnosis not present

## 2014-10-05 DIAGNOSIS — Z8673 Personal history of transient ischemic attack (TIA), and cerebral infarction without residual deficits: Secondary | ICD-10-CM

## 2014-10-05 DIAGNOSIS — Z993 Dependence on wheelchair: Secondary | ICD-10-CM | POA: Diagnosis not present

## 2014-10-05 DIAGNOSIS — I959 Hypotension, unspecified: Secondary | ICD-10-CM | POA: Diagnosis not present

## 2014-10-05 DIAGNOSIS — I714 Abdominal aortic aneurysm, without rupture, unspecified: Secondary | ICD-10-CM | POA: Diagnosis present

## 2014-10-05 DIAGNOSIS — Z66 Do not resuscitate: Secondary | ICD-10-CM | POA: Diagnosis present

## 2014-10-05 DIAGNOSIS — F17210 Nicotine dependence, cigarettes, uncomplicated: Secondary | ICD-10-CM | POA: Diagnosis present

## 2014-10-05 DIAGNOSIS — G92 Toxic encephalopathy: Secondary | ICD-10-CM | POA: Diagnosis present

## 2014-10-05 DIAGNOSIS — I70262 Atherosclerosis of native arteries of extremities with gangrene, left leg: Secondary | ICD-10-CM | POA: Diagnosis not present

## 2014-10-05 DIAGNOSIS — M79604 Pain in right leg: Secondary | ICD-10-CM | POA: Diagnosis not present

## 2014-10-05 DIAGNOSIS — Z79899 Other long term (current) drug therapy: Secondary | ICD-10-CM

## 2014-10-05 DIAGNOSIS — E559 Vitamin D deficiency, unspecified: Secondary | ICD-10-CM | POA: Diagnosis present

## 2014-10-05 DIAGNOSIS — R109 Unspecified abdominal pain: Secondary | ICD-10-CM

## 2014-10-05 DIAGNOSIS — J9811 Atelectasis: Secondary | ICD-10-CM | POA: Diagnosis not present

## 2014-10-05 DIAGNOSIS — I998 Other disorder of circulatory system: Secondary | ICD-10-CM | POA: Diagnosis not present

## 2014-10-05 DIAGNOSIS — I10 Essential (primary) hypertension: Secondary | ICD-10-CM | POA: Diagnosis present

## 2014-10-05 DIAGNOSIS — Z86711 Personal history of pulmonary embolism: Secondary | ICD-10-CM | POA: Diagnosis not present

## 2014-10-05 DIAGNOSIS — D696 Thrombocytopenia, unspecified: Secondary | ICD-10-CM | POA: Diagnosis present

## 2014-10-05 DIAGNOSIS — Z452 Encounter for adjustment and management of vascular access device: Secondary | ICD-10-CM

## 2014-10-05 DIAGNOSIS — N281 Cyst of kidney, acquired: Secondary | ICD-10-CM | POA: Diagnosis not present

## 2014-10-05 DIAGNOSIS — R6521 Severe sepsis with septic shock: Secondary | ICD-10-CM | POA: Diagnosis present

## 2014-10-05 DIAGNOSIS — R0902 Hypoxemia: Secondary | ICD-10-CM | POA: Diagnosis present

## 2014-10-05 DIAGNOSIS — R031 Nonspecific low blood-pressure reading: Secondary | ICD-10-CM | POA: Diagnosis not present

## 2014-10-05 DIAGNOSIS — G9389 Other specified disorders of brain: Secondary | ICD-10-CM | POA: Diagnosis not present

## 2014-10-05 DIAGNOSIS — E119 Type 2 diabetes mellitus without complications: Secondary | ICD-10-CM | POA: Diagnosis present

## 2014-10-05 DIAGNOSIS — N400 Enlarged prostate without lower urinary tract symptoms: Secondary | ICD-10-CM | POA: Diagnosis present

## 2014-10-05 DIAGNOSIS — K219 Gastro-esophageal reflux disease without esophagitis: Secondary | ICD-10-CM | POA: Diagnosis present

## 2014-10-05 DIAGNOSIS — R400 Somnolence: Secondary | ICD-10-CM | POA: Diagnosis not present

## 2014-10-05 DIAGNOSIS — E876 Hypokalemia: Secondary | ICD-10-CM | POA: Diagnosis present

## 2014-10-05 DIAGNOSIS — Z01818 Encounter for other preprocedural examination: Secondary | ICD-10-CM | POA: Diagnosis not present

## 2014-10-05 DIAGNOSIS — Z7401 Bed confinement status: Secondary | ICD-10-CM | POA: Diagnosis not present

## 2014-10-05 DIAGNOSIS — I252 Old myocardial infarction: Secondary | ICD-10-CM | POA: Diagnosis not present

## 2014-10-05 DIAGNOSIS — Z7901 Long term (current) use of anticoagulants: Secondary | ICD-10-CM

## 2014-10-05 DIAGNOSIS — E785 Hyperlipidemia, unspecified: Secondary | ICD-10-CM | POA: Diagnosis present

## 2014-10-05 DIAGNOSIS — R1010 Upper abdominal pain, unspecified: Secondary | ICD-10-CM

## 2014-10-05 DIAGNOSIS — N390 Urinary tract infection, site not specified: Secondary | ICD-10-CM | POA: Diagnosis present

## 2014-10-05 DIAGNOSIS — G929 Unspecified toxic encephalopathy: Secondary | ICD-10-CM | POA: Diagnosis present

## 2014-10-05 DIAGNOSIS — R40241 Glasgow coma scale score 13-15: Secondary | ICD-10-CM | POA: Diagnosis not present

## 2014-10-05 DIAGNOSIS — I82419 Acute embolism and thrombosis of unspecified femoral vein: Secondary | ICD-10-CM | POA: Diagnosis not present

## 2014-10-05 HISTORY — DX: Vitamin D deficiency, unspecified: E55.9

## 2014-10-05 HISTORY — DX: Benign prostatic hyperplasia without lower urinary tract symptoms: N40.0

## 2014-10-05 HISTORY — DX: Orchitis: N45.2

## 2014-10-05 HISTORY — DX: Allergic rhinitis, unspecified: J30.9

## 2014-10-05 HISTORY — DX: Atherosclerotic heart disease of native coronary artery without angina pectoris: I25.10

## 2014-10-05 HISTORY — DX: Gastro-esophageal reflux disease without esophagitis: K21.9

## 2014-10-05 HISTORY — DX: Congenital talipes equinovarus, unspecified foot: Q66.00

## 2014-10-05 HISTORY — DX: Dysarthria and anarthria: R47.1

## 2014-10-05 HISTORY — DX: Old myocardial infarction: I25.2

## 2014-10-05 HISTORY — DX: Dysphagia, unspecified: R13.10

## 2014-10-05 HISTORY — DX: Muscle weakness (generalized): M62.81

## 2014-10-05 LAB — URINALYSIS, ROUTINE W REFLEX MICROSCOPIC
BILIRUBIN URINE: NEGATIVE
GLUCOSE, UA: NEGATIVE mg/dL
Nitrite: POSITIVE — AB
PH: 5.5 (ref 5.0–8.0)
Protein, ur: NEGATIVE mg/dL
Specific Gravity, Urine: 1.02 (ref 1.005–1.030)
Urobilinogen, UA: 1 mg/dL (ref 0.0–1.0)

## 2014-10-05 LAB — PROTIME-INR
INR: 1.43 (ref 0.00–1.49)
Prothrombin Time: 17.6 seconds — ABNORMAL HIGH (ref 11.6–15.2)

## 2014-10-05 LAB — BLOOD GAS, ARTERIAL
ACID-BASE DEFICIT: 6.5 mmol/L — AB (ref 0.0–2.0)
Bicarbonate: 16.6 mEq/L — ABNORMAL LOW (ref 20.0–24.0)
DRAWN BY: 25788
FIO2: 100 %
O2 SAT: 99.2 %
PCO2 ART: 23.4 mmHg — AB (ref 35.0–45.0)
PO2 ART: 251 mmHg — AB (ref 80.0–100.0)
Patient temperature: 37
TCO2: 14.7 mmol/L (ref 0–100)
pH, Arterial: 7.464 — ABNORMAL HIGH (ref 7.350–7.450)

## 2014-10-05 LAB — BASIC METABOLIC PANEL
Anion gap: 11 (ref 5–15)
BUN: 62 mg/dL — AB (ref 6–23)
CO2: 20 mmol/L (ref 19–32)
Calcium: 8.2 mg/dL — ABNORMAL LOW (ref 8.4–10.5)
Chloride: 100 mmol/L (ref 96–112)
Creatinine, Ser: 2.77 mg/dL — ABNORMAL HIGH (ref 0.50–1.35)
GFR calc Af Amer: 25 mL/min — ABNORMAL LOW (ref >90)
GFR, EST NON AFRICAN AMERICAN: 21 mL/min — AB (ref >90)
Glucose, Bld: 141 mg/dL — ABNORMAL HIGH (ref 70–99)
POTASSIUM: 5.1 mmol/L (ref 3.5–5.1)
SODIUM: 131 mmol/L — AB (ref 135–145)

## 2014-10-05 LAB — MRSA PCR SCREENING: MRSA by PCR: NEGATIVE

## 2014-10-05 LAB — CBC WITH DIFFERENTIAL/PLATELET
BASOS ABS: 0 10*3/uL (ref 0.0–0.1)
Basophils Relative: 0 % (ref 0–1)
EOS ABS: 0 10*3/uL (ref 0.0–0.7)
EOS PCT: 0 % (ref 0–5)
HCT: 43.5 % (ref 39.0–52.0)
Hemoglobin: 11.6 g/dL — ABNORMAL LOW (ref 13.0–17.0)
Lymphocytes Relative: 5 % — ABNORMAL LOW (ref 12–46)
Lymphs Abs: 1.1 10*3/uL (ref 0.7–4.0)
MCH: 32 pg (ref 26.0–34.0)
MCHC: 26.7 g/dL — ABNORMAL LOW (ref 30.0–36.0)
MCV: 120.2 fL — ABNORMAL HIGH (ref 78.0–100.0)
Monocytes Absolute: 1.7 10*3/uL — ABNORMAL HIGH (ref 0.1–1.0)
Monocytes Relative: 7 % (ref 3–12)
Neutro Abs: 22.5 10*3/uL — ABNORMAL HIGH (ref 1.7–7.7)
Neutrophils Relative %: 89 % — ABNORMAL HIGH (ref 43–77)
Platelets: 174 10*3/uL (ref 150–400)
RBC: 3.62 MIL/uL — AB (ref 4.22–5.81)
RDW: 13.9 % (ref 11.5–15.5)
WBC Morphology: INCREASED
WBC: 25.4 10*3/uL — AB (ref 4.0–10.5)

## 2014-10-05 LAB — URINE MICROSCOPIC-ADD ON

## 2014-10-05 LAB — I-STAT CHEM 8, ED
BUN: 55 mg/dL — ABNORMAL HIGH (ref 6–23)
CALCIUM ION: 1.09 mmol/L — AB (ref 1.13–1.30)
CHLORIDE: 102 mmol/L (ref 96–112)
CREATININE: 2.7 mg/dL — AB (ref 0.50–1.35)
Glucose, Bld: 130 mg/dL — ABNORMAL HIGH (ref 70–99)
HCT: 34 % — ABNORMAL LOW (ref 39.0–52.0)
Hemoglobin: 11.6 g/dL — ABNORMAL LOW (ref 13.0–17.0)
Potassium: 5.1 mmol/L (ref 3.5–5.1)
Sodium: 134 mmol/L — ABNORMAL LOW (ref 135–145)
TCO2: 17 mmol/L (ref 0–100)

## 2014-10-05 LAB — HEPATIC FUNCTION PANEL
ALT: 30 U/L (ref 0–53)
AST: 111 U/L — AB (ref 0–37)
Albumin: 2.6 g/dL — ABNORMAL LOW (ref 3.5–5.2)
Alkaline Phosphatase: 68 U/L (ref 39–117)
BILIRUBIN DIRECT: 0.1 mg/dL (ref 0.0–0.5)
BILIRUBIN TOTAL: 0.4 mg/dL (ref 0.3–1.2)
Indirect Bilirubin: 0.3 mg/dL (ref 0.3–0.9)
Total Protein: 6.4 g/dL (ref 6.0–8.3)

## 2014-10-05 LAB — BRAIN NATRIURETIC PEPTIDE: B Natriuretic Peptide: 165 pg/mL — ABNORMAL HIGH (ref 0.0–100.0)

## 2014-10-05 LAB — I-STAT CG4 LACTIC ACID, ED: Lactic Acid, Venous: 2.5 mmol/L (ref 0.5–2.0)

## 2014-10-05 LAB — GLUCOSE, CAPILLARY: GLUCOSE-CAPILLARY: 146 mg/dL — AB (ref 70–99)

## 2014-10-05 LAB — TROPONIN I: TROPONIN I: 0.03 ng/mL (ref <0.031)

## 2014-10-05 MED ORDER — PIPERACILLIN-TAZOBACTAM 3.375 G IVPB 30 MIN
3.3750 g | Freq: Once | INTRAVENOUS | Status: DC
Start: 2014-10-05 — End: 2014-10-05

## 2014-10-05 MED ORDER — PIPERACILLIN-TAZOBACTAM 3.375 G IVPB
3.3750 g | Freq: Three times a day (TID) | INTRAVENOUS | Status: DC
  Administered 2014-10-05 – 2014-10-08 (×8): 3.375 g via INTRAVENOUS
  Filled 2014-10-05 (×12): qty 50

## 2014-10-05 MED ORDER — ACETAMINOPHEN 650 MG RE SUPP
650.0000 mg | Freq: Once | RECTAL | Status: AC
  Administered 2014-10-05: 650 mg via RECTAL
  Filled 2014-10-05: qty 1

## 2014-10-05 MED ORDER — VANCOMYCIN HCL IN DEXTROSE 1-5 GM/200ML-% IV SOLN
1000.0000 mg | INTRAVENOUS | Status: DC
  Administered 2014-10-05 – 2014-10-06 (×2): 1000 mg via INTRAVENOUS
  Filled 2014-10-05 (×2): qty 200

## 2014-10-05 MED ORDER — DOPAMINE-DEXTROSE 3.2-5 MG/ML-% IV SOLN
0.0000 ug/kg/min | INTRAVENOUS | Status: DC
  Filled 2014-10-05: qty 250

## 2014-10-05 MED ORDER — SODIUM CHLORIDE 0.9 % IV BOLUS (SEPSIS)
1000.0000 mL | INTRAVENOUS | Status: AC
  Administered 2014-10-05: 1000 mL via INTRAVENOUS

## 2014-10-05 MED ORDER — SODIUM CHLORIDE 0.9 % IV SOLN
Freq: Once | INTRAVENOUS | Status: AC
  Administered 2014-10-05: 19:00:00 via INTRAVENOUS

## 2014-10-05 MED ORDER — DOPAMINE-DEXTROSE 3.2-5 MG/ML-% IV SOLN
7.5000 ug/kg/min | INTRAVENOUS | Status: DC
  Administered 2014-10-05: 5 ug/kg/min via INTRAVENOUS
  Administered 2014-10-05: 7.5 ug/kg/min via INTRAVENOUS
  Filled 2014-10-05: qty 250

## 2014-10-05 MED ORDER — VANCOMYCIN HCL IN DEXTROSE 1-5 GM/200ML-% IV SOLN: 1000.0000 mg | Freq: Once | INTRAVENOUS | Status: DC
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
## 2014-10-05 NOTE — ED Notes (Signed)
Plan to continue IVF and start Dopamine at .

## 2014-10-05 NOTE — ED Notes (Signed)
i-stat labs given to MD
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
## 2014-10-05 NOTE — ED Notes (Signed)
Pt follows commands.  Strong hand grips.

## 2014-10-05 NOTE — ED Notes (Signed)
Pt resident of [facility].  Reports decreased LOC since 1350.  Mottling noted to r leg from hip down, ems reports leg cool to touch from knee down and unable to palpate a pedal pulse.  [facility] staff says noticed the mottling the same time she noticed the decreased LOC.  Reports history of PE.  EMS reports bp was 73/49 and cbg 155.  EMS reports very diminished lung sounds.  Room air 02 sats were in the 80's.  EMS reports started IVF wide open and put pt on NRB, reports pt slightly more alert.  Pt was on IV fluid NS at 3850ml/hr for low Na.

## 2014-10-05 NOTE — H&P (Signed)
PULMONARY / CRITICAL CARE MEDICINE   Name: [name] [name] MRN: [id number] DOB: [birth year]    ADMISSION DATE:  10/05/2014 CONSULTATION DATE:  10/05/2014  REFERRING MD :  [facility] EDP  CHIEF COMPLAINT:  AMS  INITIAL PRESENTATION: 74 year old male with multiple medical problems to ED from SNF 3/1 with AMS and one week of lower extremity discoloration per family. In ED was hypotensive requiring pressors, unable assess BLE pulses and mottling of lower extremities. Transferred to [facility] for vascular surgery evaluation and ICU admission.   STUDIES:  3/1 CT head > Atrophy with small vessel disease, stable. Encephalomalacia in both frontal lobes consistent with the previous aneurysm clipping surgery. No acute infarct apparent. 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 Correlation with the peripheral pulses in the groin are recommended given the patient's clinical history and there may be occlusive changes within these aneurysms.  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 At baseline he does not walk, but is able to push himself around in a wheelchair. The AM if 3/1 he was in his USOH according to facility staff. He fed himself lunch and went back to his room. One hour later he was found to be minimally responsive and was taken to [facility] ED. He was given 30cc/kg volume and placed on O2 for sats in the 80's. After volume family stated he was at his baseline, which EDP described as very weak. He was febrile, and started on empiric antibiotics. In ED he was noted to have mottling in BLE. He reportedly was told that he had a DVT in his right leg  previously. 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 Interpreting his MAR reveals that it looks like he has been on Xarelto in the past. Within the last week, this was switched to Lovenox and Coumadin. He was transferred to [facility] for ICU admission and vascular surgery eval. He has been seen by Dr Myra Gianotti who is under the impression that the patient needs to be stabilized before going to OR, however this likely places him outside of the window for any limb saving procedures and will probably result in amputation. PCCM to admit.    PAST MEDICAL HISTORY :   has a past medical history of Pulmonary embolism; Hyperlipidemia; Hypertension; Chronic anticoagulation; Cerebrovascular disease; Tobacco abuse, in remission; Obesity; Urinary incontinence; Gastroesophageal reflux disease; Degenerative joint disease; Anxiety and depression; Orchitis, epididymitis, and epididymo-orchitis (2005); Substance abuse; Fasting hyperglycemia (2011); Stroke; Cerebral aneurysm; Depression; Suicide attempt; Diabetes mellitus without complication; Dysphagia; Muscle weakness; Allergic rhinitis; Enlarged prostate; Dysarthria; Vitamin D deficiency; GERD (gastroesophageal reflux disease); Cerebral aneurysm; Orchitis; MI, old; Atherosclerotic heart disease; and Congenital talipes equinovarus.  has past surgical history that includes Cerebral aneurysm repair (1990); Total hip arthroplasty (1979); ORIF femur fracture (1979); laparotomy (1966); Eye surgery (2000); and Colonoscopy (2012). Prior to Admission medications   Medication Sig Start Date End Date Taking? Authorizing Provider  B Complex Vitamins (B COMPLEX-B12 PO) Take 1,000 mcg by mouth daily.   Yes Historical Provider, MD  carvedilol (COREG) 25 MG tablet Take 25 mg by mouth 2 (two) times daily with a meal.   Yes Historical Provider,  MD  cephALEXin (KEFLEX) 250 MG capsule Take 250 mg by mouth daily.    Yes Historical Provider, MD  citalopram (CELEXA) 40 MG tablet Take 40 mg by mouth  daily.   Yes Historical Provider, MD  clotrimazole-betamethasone (LOTRISONE) cream Apply 1 application topically 2 (two) times daily. 08/12/13  Yes Kerri Perches, MD  docusate sodium (COLACE) 100 MG capsule Take 200 mg by mouth 2 (two) times daily.   Yes Historical Provider, MD  enoxaparin (LOVENOX) 80 MG/0.8ML injection Inject 80 mg into the skin 2 (two) times daily. 7 day course starting on 10/03/2014 for DVTS   Yes Historical Provider, MD  ergocalciferol (VITAMIN D2) 50000 UNITS capsule Take 1 capsule (50,000 Units total) by mouth once a week. One capsule once weekly 12/08/13  Yes Kerri Perches, MD  fluticasone [facility]) 50 MCG/ACT nasal spray Place 2 sprays into both nostrils daily. 05/10/14  Yes Kerri Perches, MD  furosemide (LASIX) 40 MG tablet Take 40 mg by mouth daily.   Yes Historical Provider, MD  gabapentin (NEURONTIN) 100 MG capsule TAKE 1 CAPSULE BY MOUTH AT BEDTIME. 06/11/14  Yes Kerri Perches, MD  HYDROcodone-acetaminophen (NORCO/VICODIN) 5-325 MG per tablet Take 1 tablet by mouth every 4 (four) hours as needed for moderate pain or severe pain. Patient taking differently: Take 1 tablet by mouth 2 (two) times daily. For leg pain 08/26/14  Yes Joya Gaskins, MD  isosorbide mononitrate (IMDUR) 30 MG 24 hr tablet Take 30 mg by mouth daily.   Yes Historical Provider, MD  lisinopril (PRINIVIL,ZESTRIL) 20 MG tablet Take 20 mg by mouth daily. 03/07/11  Yes Kerri Perches, MD  MAPAP 325 MG tablet TAKE 1 TABLET BY MOUTH TWICE DAILY AS NEEDED FOR PAIN OR FEVER GREATER THAN OR EQUAL TO 100 DEGREES.   Yes Kerri Perches, MD  pravastatin (PRAVACHOL) 80 MG tablet Take 1 tablet (80 mg total) by mouth every evening. 08/16/14  Yes Laqueta Linden, MD  sodium chloride 0.9 % nebulizer solution Take by nebulization continuous. *Use 59ml/hr intravenously every shift for hyponatremia. Run at 31ml/hr x 2 Liters   Yes Historical Provider, MD  solifenacin (VESICARE) 10 MG tablet Take 10  mg by mouth daily.   Yes Historical Provider, MD  tamsulosin (FLOMAX) 0.4 MG CAPS capsule Take 0.4 mg by mouth daily.   Yes Historical Provider, MD  warfarin (COUMADIN) 5 MG tablet Take 5 mg by mouth daily.   Yes Historical Provider, MD  THICK-IT #2 POWD USE AS DIRECTED. Patient not taking: Reported on 10/05/2014    Kerri Perches, MD   No Known Allergies  FAMILY HISTORY:  indicated that his mother is deceased. He indicated that his father is deceased. He indicated that his sister is alive. He indicated that only one of his two brothers is alive.  SOCIAL HISTORY:  reports that he has been smoking Cigarettes.  He started smoking about 55 years ago. He has a 15 pack-year smoking history. He has never used smokeless tobacco. He reports that he does not drink alcohol or use illicit drugs.  REVIEW OF SYSTEMS:  Unable due to encephalopathy  SUBJECTIVE:   VITAL SIGNS: Temp:  [98.5 F (36.9 C)-101.3 F (38.5 C)] 98.9 F (37.2 C) (03/01 2100) Pulse Rate:  [26-91] 89 (03/01 1945) Resp:  [26-34] 30 (03/01 1945) BP: (74-97)/(36-71) 80/36 mmHg (03/01 1945) SpO2:  [90 %-100 %] 100 % (03/01 1945) Weight:  [87.544 kg (193 lb)] 87.544 kg (193 lb) (03/01 1633) HEMODYNAMICS:  VENTILATOR SETTINGS:   INTAKE / OUTPUT:  Intake/Output Summary (Last 24 hours) at 10/05/14 2115 Last data filed at 10/05/14 1954  Gross per 24 hour  Intake      0 ml  Output    500 ml  Net   -500 ml    PHYSICAL EXAMINATION: General:  Elderly male in NAD Neuro:  Alert, minimally communicates, follows commands.  HEENT:  [place]/AT, PERRL, no JVD noted Cardiovascular:  RRR, no MRG Lungs:  Clear bilateral breath sounds, non-labored Abdomen:  Soft, non-tender, non-distended Musculoskeletal:  No acute deformity Skin:  Pale lower extremities, unable to palpate pulse bilaterally.   LABS:  CBC  Recent Labs Lab 10/05/14 1503 10/05/14 1516  WBC 25.4*  --   HGB 11.6* 11.6*  HCT 43.5 34.0*  PLT 174  --     Coag's  Recent Labs Lab 10/05/14 1503  INR 1.43   BMET  Recent Labs Lab 10/05/14 1503 10/05/14 1516  NA 131* 134*  K 5.1 5.1  CL 100 102  CO2 20  --   BUN 62* 55*  CREATININE 2.77* 2.70*  GLUCOSE 141* 130*   Electrolytes  Recent Labs Lab 10/05/14 1503  CALCIUM 8.2*   Sepsis Markers  Recent Labs Lab 10/05/14 1518  LATICACIDVEN 2.50*   ABG  Recent Labs Lab 10/05/14 1622  PHART 7.464*  PCO2ART 23.4*  PO2ART 251.0*   Liver Enzymes  Recent Labs Lab 10/05/14 1503  AST 111*  ALT 30  ALKPHOS 68  BILITOT 0.4  ALBUMIN 2.6*   Cardiac Enzymes  Recent Labs Lab 10/05/14 1503  TROPONINI 0.03   Glucose  Recent Labs Lab 10/05/14 2044  GLUCAP 146*    Imaging No results found.   ASSESSMENT / PLAN:  PULMONARY A: No acute issues  P:   Supplemental O2 Intermittent CXR  CARDIOVASCULAR CVL 3/2 >>> A:  Septic shock, likely from ischemic lower extremity  AAA - 5.3 cm Bilateral lower extremity vascular disease with ischemia, R>L  P:  MAP goal > 65 mm/Hg Insert CVL CVP monitoring IVF resuscitation Levophed to maintatin MAP goal Wean dopamine one levophed on board Repeat lactic Vascular surgery following, too unstable for procedure, will likely need amputation once stable should he/family agree  RENAL A:   AKI  P:   Volume Bmet  GASTROINTESTINAL A:   No acute issues  P:   SUP: IV protonix NPO for now  HEMATOLOGIC A:   Anemia  P:  Full dose heparin  INFECTIOUS A:   Septic shock 2nd to lower extremity ischemia  P:   BCx2 3/1 > UC  3/1 > Abx: pip/tazo, start date 3/1 Abx: Vancomycin, start date 3/1 Monitor WBC and fever curve  ENDOCRINE A:   DM  P:   CBG monitoring and SSI  NEUROLOGIC A:   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 I have discussed with resident/NP and other care providers such as pharmacist, RN and RRT. In addition, I personally evaluated patient and elicited key findings of: no pulse from fem down, extensive d.w family, they understand poor circumstances,  they wish line, will place then transition to levophed as failing dopamine, consider heparin , but first place line and assess coags, was on xaralto with ARF, consider cvp assessment, may need amputation if his BP improves, he remains as DNR otherwise, cortisol assessment, appreciate vascular consult done, map goal 65 The patient is critically ill with multiple organ systems failure and requires high complexity decision making for assessment and support, frequent evaluation and titration of therapies, application of advanced monitoring technologies and extensive interpretation of multiple databases.   Critical Care Time devoted to patient care services described in this note is35 Minutes. This time reflects time of care of this signee: Rory Percyaniel Feinstein, MD FACP. This critical care time does not reflect procedure time, or teaching time or supervisory time of PA/NP/Med student/Med Resident etc but could involve care discussion time. Rest per NP/medical resident whose note is outlined above and that I agree with   Mcarthur Rossettianiel J. Tyson [name], MD, FACP Pgr: [phone] [place] [facility] 10/06/2014 12:33 AM

## 2014-10-05 NOTE — ED Notes (Signed)
Dr Blinda LeatherwoodPollina aware of 3rd bag of NS bolus completed and BP 85/50 with MAP of 62.  Dr Blinda LeatherwoodPollina presently on phone with vascular surgery to determine treatment.

## 2014-10-06 ENCOUNTER — Inpatient Hospital Stay (HOSPITAL_COMMUNITY): Payer: Medicare Other

## 2014-10-06 DIAGNOSIS — R109 Unspecified abdominal pain: Secondary | ICD-10-CM

## 2014-10-06 DIAGNOSIS — R4182 Altered mental status, unspecified: Secondary | ICD-10-CM

## 2014-10-06 DIAGNOSIS — I714 Abdominal aortic aneurysm, without rupture, unspecified: Secondary | ICD-10-CM | POA: Diagnosis present

## 2014-10-06 DIAGNOSIS — G92 Toxic encephalopathy: Secondary | ICD-10-CM | POA: Diagnosis present

## 2014-10-06 DIAGNOSIS — G929 Unspecified toxic encephalopathy: Secondary | ICD-10-CM | POA: Diagnosis present

## 2014-10-06 DIAGNOSIS — Z452 Encounter for adjustment and management of vascular access device: Secondary | ICD-10-CM

## 2014-10-06 DIAGNOSIS — R40241 Glasgow coma scale score 13-15: Secondary | ICD-10-CM

## 2014-10-06 DIAGNOSIS — I739 Peripheral vascular disease, unspecified: Secondary | ICD-10-CM | POA: Diagnosis present

## 2014-10-06 LAB — GLUCOSE, CAPILLARY
GLUCOSE-CAPILLARY: 144 mg/dL — AB (ref 70–99)
GLUCOSE-CAPILLARY: 184 mg/dL — AB (ref 70–99)
GLUCOSE-CAPILLARY: 161 mg/dL — AB (ref 70–99)
Glucose-Capillary: 121 mg/dL — ABNORMAL HIGH (ref 70–99)
Glucose-Capillary: 158 mg/dL — ABNORMAL HIGH (ref 70–99)
Glucose-Capillary: 159 mg/dL — ABNORMAL HIGH (ref 70–99)

## 2014-10-06 LAB — BASIC METABOLIC PANEL
ANION GAP: 9 (ref 5–15)
BUN: 47 mg/dL — ABNORMAL HIGH (ref 6–23)
CALCIUM: 8 mg/dL — AB (ref 8.4–10.5)
CHLORIDE: 107 mmol/L (ref 96–112)
CO2: 18 mmol/L — ABNORMAL LOW (ref 19–32)
Creatinine, Ser: 1.89 mg/dL — ABNORMAL HIGH (ref 0.50–1.35)
GFR, EST AFRICAN AMERICAN: 39 mL/min — AB (ref >90)
GFR, EST NON AFRICAN AMERICAN: 34 mL/min — AB (ref >90)
Glucose, Bld: 157 mg/dL — ABNORMAL HIGH (ref 70–99)
Potassium: 3.9 mmol/L (ref 3.5–5.1)
SODIUM: 134 mmol/L — AB (ref 135–145)

## 2014-10-06 LAB — URINALYSIS, ROUTINE W REFLEX MICROSCOPIC
BILIRUBIN URINE: NEGATIVE
Glucose, UA: NEGATIVE mg/dL
Ketones, ur: NEGATIVE mg/dL
NITRITE: NEGATIVE
Protein, ur: 30 mg/dL — AB
Specific Gravity, Urine: 1.018 (ref 1.005–1.030)
Urobilinogen, UA: 1 mg/dL (ref 0.0–1.0)
pH: 5 (ref 5.0–8.0)

## 2014-10-06 LAB — CARBOXYHEMOGLOBIN
CARBOXYHEMOGLOBIN: 0.9 % (ref 0.5–1.5)
Methemoglobin: 0.9 % (ref 0.0–1.5)
O2 SAT: 62 %
Total hemoglobin: 12.5 g/dL — ABNORMAL LOW (ref 13.5–18.0)

## 2014-10-06 LAB — CORTISOL: CORTISOL PLASMA: 40.6 ug/dL

## 2014-10-06 LAB — LACTIC ACID, PLASMA: Lactic Acid, Venous: 1.2 mmol/L (ref 0.5–2.0)

## 2014-10-06 LAB — URINE MICROSCOPIC-ADD ON

## 2014-10-06 LAB — MAGNESIUM: Magnesium: 2.1 mg/dL (ref 1.5–2.5)

## 2014-10-06 LAB — CBC
HCT: 34.3 % — ABNORMAL LOW (ref 39.0–52.0)
Hemoglobin: 11.8 g/dL — ABNORMAL LOW (ref 13.0–17.0)
MCH: 31.3 pg (ref 26.0–34.0)
MCHC: 34.4 g/dL (ref 30.0–36.0)
MCV: 91 fL (ref 78.0–100.0)
PLATELETS: 136 10*3/uL — AB (ref 150–400)
RBC: 3.77 MIL/uL — ABNORMAL LOW (ref 4.22–5.81)
RDW: 13.1 % (ref 11.5–15.5)
WBC: 25 10*3/uL — AB (ref 4.0–10.5)

## 2014-10-06 LAB — PHOSPHORUS: PHOSPHORUS: 3.4 mg/dL (ref 2.3–4.6)

## 2014-10-06 LAB — HEPARIN LEVEL (UNFRACTIONATED): HEPARIN UNFRACTIONATED: 0.37 [IU]/mL (ref 0.30–0.70)

## 2014-10-06 LAB — CLOSTRIDIUM DIFFICILE BY PCR: Toxigenic C. Difficile by PCR: NEGATIVE

## 2014-10-06 MED ORDER — NOREPINEPHRINE BITARTRATE 1 MG/ML IV SOLN
5.0000 ug/min | INTRAVENOUS | Status: DC
  Administered 2014-10-06: 15 ug/min via INTRAVENOUS
  Administered 2014-10-06: 17 ug/min via INTRAVENOUS
  Administered 2014-10-06: 20 ug/min via INTRAVENOUS
  Administered 2014-10-07: 6 ug/min via INTRAVENOUS
  Filled 2014-10-06 (×9): qty 4

## 2014-10-06 MED ORDER — FENTANYL CITRATE 0.05 MG/ML IJ SOLN
25.0000 ug | INTRAMUSCULAR | Status: DC | PRN
Start: 2014-10-06 — End: 2014-10-11
  Administered 2014-10-06 – 2014-10-07 (×3): 25 ug via INTRAVENOUS
  Administered 2014-10-07 – 2014-10-08 (×3): 50 ug via INTRAVENOUS
  Filled 2014-10-06 (×6): qty 2

## 2014-10-06 MED ORDER — DEXTROSE-NACL 5-0.45 % IV SOLN
INTRAVENOUS | Status: DC
  Administered 2014-10-06 – 2014-10-07 (×3): via INTRAVENOUS
  Administered 2014-10-07: 1000 mL via INTRAVENOUS
  Administered 2014-10-08: 05:00:00 via INTRAVENOUS

## 2014-10-06 MED ORDER — HEPARIN BOLUS VIA INFUSION
4500.0000 [IU] | Freq: Once | INTRAVENOUS | Status: AC
  Administered 2014-10-06: 4500 [IU] via INTRAVENOUS
  Filled 2014-10-06: qty 4500

## 2014-10-06 MED ORDER — HYDROCORTISONE NA SUCCINATE PF 100 MG IJ SOLR: 50.0000 mg | Freq: Four times a day (QID) | INTRAMUSCULAR | Status: DC

## 2014-10-06 MED ORDER — SODIUM CHLORIDE 0.9 % IV SOLN
INTRAVENOUS | Status: DC
  Administered 2014-10-06 – 2014-10-09 (×5): via INTRAVENOUS

## 2014-10-06 MED ORDER — CHLORHEXIDINE GLUCONATE 0.12 % MT SOLN
15.0000 mL | Freq: Two times a day (BID) | OROMUCOSAL | Status: DC
  Administered 2014-10-06 – 2014-10-11 (×10): 15 mL via OROMUCOSAL
  Filled 2014-10-06 (×14): qty 15

## 2014-10-06 MED ORDER — INSULIN ASPART 100 UNIT/ML ~~LOC~~ SOLN
2.0000 [IU] | SUBCUTANEOUS | Status: DC
  Administered 2014-10-06: 2 [IU] via SUBCUTANEOUS
  Administered 2014-10-06: 4 [IU] via SUBCUTANEOUS
  Administered 2014-10-06: 2 [IU] via SUBCUTANEOUS
  Administered 2014-10-06 – 2014-10-07 (×5): 4 [IU] via SUBCUTANEOUS

## 2014-10-06 MED ORDER — HYDROCORTISONE NA SUCCINATE PF 100 MG IJ SOLR
50.0000 mg | Freq: Four times a day (QID) | INTRAMUSCULAR | Status: DC
  Administered 2014-10-06 – 2014-10-07 (×5): 50 mg via INTRAVENOUS
  Filled 2014-10-06 (×2): qty 2
  Filled 2014-10-06 (×4): qty 1
  Filled 2014-10-06: qty 2

## 2014-10-06 MED ORDER — CETYLPYRIDINIUM CHLORIDE 0.05 % MT LIQD
7.0000 mL | Freq: Two times a day (BID) | OROMUCOSAL | Status: DC
  Administered 2014-10-06 – 2014-10-11 (×10): 7 mL via OROMUCOSAL

## 2014-10-06 MED ORDER — HEPARIN (PORCINE) IN NACL 100-0.45 UNIT/ML-% IJ SOLN
1600.0000 [IU]/h | INTRAMUSCULAR | Status: DC
  Administered 2014-10-06 – 2014-10-07 (×2): 1400 [IU]/h via INTRAVENOUS
  Administered 2014-10-07: 2000 [IU]/h via INTRAVENOUS
  Filled 2014-10-06 (×3): qty 250

## 2014-10-06 NOTE — H&P (Signed)
PULMONARY / CRITICAL CARE MEDICINE   Name: [name] [name] MRN: [id number] DOB: 01/21/1941    ADMISSION DATE:  10/05/2014 CONSULTATION DATE:  10/05/2014  REFERRING MD :  [facility] EDP  CHIEF COMPLAINT:  AMS  INITIAL PRESENTATION: 74 year old male with multiple medical problems to ED from SNF 3/1 with AMS and one week of lower extremity discoloration per family. In ED was hypotensive requiring pressors, unable assess BLE pulses and mottling of lower extremities. [facility] evaluation and ICU admission.   STUDIES:  3/1 CT head > Atrophy with small vessel disease, stable. Encephalomalacia in both frontal lobes consistent with the previous aneurysm clipping surgery. No acute infarct apparent. 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 Correlation with the peripheral pulses in the groin are recommended given the patient's clinical history and there may be occlusive changes within these aneurysms.  SIGNIFICANT EVENTS: 3/1 Admit from SNF 3/2 UA significant for UTI 3/2 VVS evaluation, patient not candidate for revascularization   SUBJECTIVE: on levo at 15 , renal fxn better  VITAL SIGNS: Temp:  [98.1 F (36.7 C)-101.3 F (38.5 C)] 98.1 F (36.7 C) (03/02 1145) Pulse Rate:  [26-102] 81 (03/02 1200) Resp:  [21-43] 28 (03/02 1200) BP: (74-131)/(36-77) 114/73 mmHg (03/02 1200) SpO2:  [90 %-100 %] 98 % (03/02 1200) Weight:  [193 lb (87.544 kg)] 193 lb (87.544 kg) (03/01 1633) HEMODYNAMICS: CVP:  [0 mmHg-6 mmHg] 2 mmHg VENTILATOR SETTINGS:   INTAKE / OUTPUT:  Intake/Output Summary (Last 24 hours) at 10/06/14 1306 Last data filed at 10/06/14 1200  Gross per 24 hour  Intake 2082.05 ml  Output   2450 ml  Net -367.95 ml    PHYSICAL EXAMINATION: General:  Elderly male in NAD Neuro:  Alert,  minimally communicates, follows commands.  HEENT:  [place]/AT, PERRL, no JVD noted Cardiovascular:  RRR, no MRG Lungs:  Clear bilateral breath sounds, non-labored Abdomen:  Soft, non-tender, non-distended Musculoskeletal:  No acute deformity Skin:  Pale lower extremities rt , no pulses fems, cold rt, unable to palpate pulse bilaterally.   LABS:  CBC  Recent Labs Lab 10/05/14 1503 10/05/14 1516 10/06/14 0434  WBC 25.4*  --  25.0*  HGB 11.6* 11.6* 11.8*  HCT 43.5 34.0* 34.3*  PLT 174  --  136*   Coag's  Recent Labs Lab 10/05/14 1503  INR 1.43   BMET  Recent Labs Lab 10/05/14 1503 10/05/14 1516 10/06/14 0434  NA 131* 134* 134*  K 5.1 5.1 3.9  CL 100 102 107  CO2 20  --  18*  BUN 62* 55* 47*  CREATININE 2.77* 2.70* 1.89*  GLUCOSE 141* 130* 157*   Electrolytes  Recent Labs Lab 10/05/14 1503 10/06/14 0434  CALCIUM 8.2* 8.0*  MG  --  2.1  PHOS  --  3.4   Sepsis Markers  Recent Labs Lab 10/05/14 1518 10/06/14 0130  LATICACIDVEN 2.50* 1.2   ABG  Recent Labs Lab 10/05/14 1622  PHART 7.464*  PCO2ART 23.4*  PO2ART 251.0*   Liver Enzymes  Recent Labs Lab 10/05/14 1503  AST 111*  ALT 30  ALKPHOS 68  BILITOT 0.4  ALBUMIN 2.6*   Cardiac Enzymes  Recent Labs Lab 10/05/14 1503  TROPONINI 0.03   Glucose  Recent Labs Lab 10/05/14 2044 10/06/14 0134 10/06/14 0437 10/06/14 0743 10/06/14 1137  GLUCAP 146* 121* 144* 184* 161*    Imaging Ct Abdomen Pelvis Wo Contrast  10/05/2014   CLINICAL DATA:  Abdominal pain and cold right leg  EXAM: CT ABDOMEN AND PELVIS WITHOUT CONTRAST  TECHNIQUE: Multidetector CT imaging of the abdomen and pelvis was performed following the standard protocol without IV contrast.  COMPARISON:  None.  FINDINGS: Lung bases demonstrate some dependent right basilar atelectasis. Coronary calcifications are seen. The liver, spleen, adrenal glands and pancreas are within normal limits. Some dependent density is noted within the  gallbladder which may represent gallbladder sludge or very small gallstones. Multiple renal cysts are noted as well as renal vascular calcifications. No calculi or obstructive changes are seen.  The bladder is well distended. No pelvic mass lesion is noted. The appendix is not well seen although no inflammatory changes are noted. There is aneurysmal dilatation of the abdominal aorta to 5.2 x 4.7 cm. This extends to the level of the aortic bifurcation and also involves the right common iliac artery. It is distended to 4.7 x 5.2 cm. The more distal external iliac artery on the right shows no aneurysmal dilatation. 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  The osseous structures show postoperative change in the hips bilaterally. Degenerative changes of the lumbar spine are seen.  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 Correlation with the peripheral pulses in the groin are recommended given the patient's clinical history and there may be occlusive changes within these aneurysms. No findings to suggest rupture are identified.  Recommend followup by abdomen and pelvis CTA in 3-6 months, and vascular surgery referral/consultation if not already obtained. This recommendation follows ACR consensus guidelines: White Paper of the ACR Incidental Findings Committee II on Vascular Findings. J Am Coll Radiol 2013; 10:789-794.  Dependent density within the gallbladder likely representing small stones or a gallbladder sludge.  Bilateral renal cysts   Electronically Signed   By: Alcide Clever M.D.   On: 10/05/2014 16:30   Ct Head Wo Contrast  10/05/2014   CLINICAL DATA:  Altered mental status  EXAM: CT HEAD WITHOUT CONTRAST  TECHNIQUE: Contiguous axial images were obtained from the base of the skull through the vertex without intravenous contrast.   COMPARISON:  August 25, 2014  FINDINGS: There is moderate diffuse atrophy which is stable. There is encephalomalacia in both frontal lobes with aneurysm clips located in the midline slightly anterior to the lateral ventricles consistent with anterior cerebral artery aneurysm clipping. There is no intracranial mass, hemorrhage, extra-axial fluid collection, or midline shift. There is extensive small vessel disease in the centra semiovale bilaterally. There is no demonstrable acute infarct.  The bony calvarium appears intact except for bilateral frontal craniotomy defects. Mastoid air cells are clear. There is diffuse opacification of each maxillary antrum as well as multiple posterior ethmoid air cells and the sphenoid sinus complexes bilaterally. There is also moderate right frontal sinus opacification.  IMPRESSION: Atrophy with small vessel disease, stable. Encephalomalacia in both frontal lobes consistent with the previous aneurysm clipping surgery. No acute infarct apparent. No hemorrhage, mass, or extra-axial fluid collection. Postoperative change in each frontal bone. Extensive paranasal sinus disease. Mastoids bilaterally clear.   Electronically Signed   By: Bretta Bang III M.D.   On: 10/05/2014 16:25   Dg Chest Portable 1 View  10/05/2014   CLINICAL DATA:  Altered mental status.  EXAM: PORTABLE CHEST - 1 VIEW  COMPARISON:  Single view of the chest and CT chest 10/08/2008.  FINDINGS: Lung volumes are low. No consolidative process, pneumothorax or effusion is identified. Heart size is upper normal.  IMPRESSION: No acute disease.   Electronically Signed   By: Drusilla Kanner M.D.   On: 10/05/2014 15:26     ASSESSMENT / PLAN:  PULMONARY A: No acute issues  P:   Supplemental O2 Intermittent CXR DNI Comfort if fails  CARDIOVASCULAR CVL 3/2 >>> A:  Septic shock, likely from ischemic lower extremity  AAA - 5.3 cm Bilateral lower extremity vascular disease with ischemia, R>L Hypovolemia  for sure P:  MAP goal > 65 mm/Hg CVP 3 and remain son levo,  IVF resuscitation, on going increase Levophed to maintatin MAP goal Repeat lactic re assuring Vascular surgery following, too unstable for procedure, wold prefer to continued volume then consider aka in am  Hep drip  RENAL A:   AKI, ATN Hypovolemia  P:   Volume increase Bmet  GASTROINTESTINAL A:   No acute issues  P:   SUP: IV protonix NPO for now, maintain as likle yneeds AKA  HEMATOLOGIC A:   Anemia dilutional leukoctyosis fib P:  Full dose heparin cosnideration  INFECTIOUS A:   Septic shock 2nd to lower extremity ischemia  P:   BCx2 3/1 > UC  3/1 > Abx: pip/tazo 3/1>>> Abx: Vancomycin 3/1>>> Monitor WBC and fever curve  Maintain current empiric polymicrobial coverage Watch for demarcation  ENDOCRINE A:   DM  P:   CBG monitoring and SSI Send cortisol, then add stress steroids  NEUROLOGIC A:   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 I have discussed with resident/NP and other care providers such as pharmacist, RN and RRT. In addition, I personally evaluated patient and elicited key findings of: levo remains, seems still so hypovolemic, increase volume, add stress roids, get cortisol, with some resolution of renal fxn , would continued fluid resus and conisder aka in am , d/w vascualr The patient is critically ill with multiple organ systems failure and requires high complexity decision making for assessment and support, frequent evaluation and titration of therapies, application of advanced  monitoring technologies and extensive interpretation of multiple databases.   Critical Care Time devoted to patient care services described in this note is30  Minutes. This time reflects time of care of this signee: Rory Percy, MD FACP. This critical care time does not reflect procedure time, or teaching time or supervisory time of PA/NP/Med student/Med Resident etc but could involve care discussion time. Rest per NP/medical resident whose note is outlined above and that I agree with   [name]. Tyson Alias, MD, FACP Pgr: [phone] [place] [facility] 10/06/2014 1:13 PM
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
## 2014-10-06 NOTE — Progress Notes (Addendum)
    Subjective  - hospital day #2  The patient is somewhat more alert this morning.  He does state that his right leg hurts.  He is still lethargic   Physical Exam:  The patient's right leg is more mottled today.  There does not appear to be any motor or sensory function remaining.  The leg is not viable.  It is cold up to the knee.       Assessment/Plan:    At this time, the patient is not a candidate for revascularization.  If he desires to continue with aggressive treatment, he will require an above-knee amputation.  He is outside the window of time where revascularization could salvage his leg.  He was not a candidate for surgery when he initially presented because of his hypotension secondary to urosepsis.  This appears to have improved today, however his leg has clearly gotten worse and is not salvageable  BRABHAM IV, V. WELLS 10/06/2014 10:59 AM --  Filed Vitals:   10/06/14 1000  BP: 106/57  Pulse: 79  Temp:   Resp: 22    Intake/Output Summary (Last 24 hours) at 10/06/14 1059 Last data filed at 10/06/14 0900  Gross per 24 hour  Intake 1661.02 ml  Output   2350 ml  Net -688.98 ml     Laboratory CBC    Component Value Date/Time   WBC 25.0* 10/06/2014 0434   HGB 11.8* 10/06/2014 0434   HCT 34.3* 10/06/2014 0434   PLT 136* 10/06/2014 0434    BMET    Component Value Date/Time   NA 134* 10/06/2014 0434   K 3.9 10/06/2014 0434   CL 107 10/06/2014 0434   CO2 18* 10/06/2014 0434   GLUCOSE 157* 10/06/2014 0434   BUN 47* 10/06/2014 0434   CREATININE 1.89* 10/06/2014 0434   CREATININE 1.23 12/07/2013 0812   CALCIUM 8.0* 10/06/2014 0434   GFRNONAA 34* 10/06/2014 0434   GFRNONAA 58* 12/07/2013 0812   GFRAA 39* 10/06/2014 0434   GFRAA 67 12/07/2013 0812    COAG Lab Results  Component Value Date   INR 1.43 10/05/2014   INR 1.46 06/03/2013   INR 2.3 04/08/2013   No results found for: PTT  Antibiotics Anti-infectives    Start     Dose/Rate Route  Frequency Ordered Stop   10/05/14 1800  piperacillin-tazobactam (ZOSYN) IVPB 3.375 g     3.375 g 12.5 mL/hr over 240 Minutes Intravenous Every 8 hours 10/05/14 1640     10/05/14 1700  vancomycin (VANCOCIN) IVPB 1000 mg/200 mL premix     1,000 mg 200 mL/hr over 60 Minutes Intravenous Every 24 hours 10/05/14 1639     10/05/14 1630  piperacillin-tazobactam (ZOSYN) IVPB 3.375 g  Status:  Discontinued     3.375 g 100 mL/hr over 30 Minutes Intravenous  Once 10/05/14 1629 10/05/14 1640   10/05/14 1630  vancomycin (VANCOCIN) IVPB 1000 mg/200 mL premix  Status:  Discontinued     1,000 mg 200 mL/hr over 60 Minutes Intravenous  Once 10/05/14 1629 10/05/14 1639       V. Charlena CrossWells Brabham IV, M.D. [facility] Office: [phone] Pager:  [phone]
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
## 2014-10-06 NOTE — Progress Notes (Signed)
Spoke with NP about patient's cardiac rhythm being a-fib (irregulat with no identifiable p-waves).  Was told as long as rate controlled, because the patient is being consulted by vascular surgery for possible procedure later today, anti-coagulation is unlikely tonight.  Will continue to monitor.

## 2014-10-06 NOTE — Progress Notes (Signed)
ANTICOAGULATION CONSULT NOTE - Follow Up Consult  Pharmacy Consult for heparin Indication: pulmonary embolus, DVT, and ischemic leg  Labs:  Recent Labs  10/05/14 1503 10/05/14 1516 10/06/14 0434 10/06/14 2230  HGB 11.6* 11.6* 11.8*  --   HCT 43.5 34.0* 34.3*  --   PLT 174  --  136*  --   LABPROT 17.6*  --   --   --   INR 1.43  --   --   --   HEPARINUNFRC  --   --   --  0.37  CREATININE 2.77* 2.70* 1.89*  --   TROPONINI 0.03  --   --   --      Assessment/Plan:  74yo male therapeutic on heparin with initial dosing for PE/DVT/ischemia. Will continue gtt at current rate and confirm stable with am labs.   Vernard GamblesVeronda Sylwia Cuervo, PharmD, BCPS  10/06/2014,11:34 PM

## 2014-10-06 NOTE — Procedures (Signed)
Central Venous Catheter Insertion Procedure Note [name] SawyersDavid T [name] [id number] [birth year]  Procedure: Insertion of Central Venous Catheter Indications: Assessment of intravascular volume and Drug and/or fluid administration  Procedure Details Consent: Risks of procedure as well as the alternatives and risks of each were explained to the (patient/caregiver).  Consent for procedure obtained. Time Out: Verified patient identification, verified procedure, site/side was marked, verified correct patient position, special equipment/implants available, medications/allergies/relevent history reviewed, required imaging and test results available.  Performed  Maximum sterile technique was used including antiseptics, cap, gloves, gown, hand hygiene, mask and sheet. Skin prep: Chlorhexidine; local anesthetic administered A antimicrobial bonded/coated triple lumen catheter was placed in the left internal jugular vein using the Seldinger technique. Ultrasound guidance used.Yes.   Catheter placed to 20 cm. Blood aspirated via all 3 ports and then flushed x 3. Line sutured x 2 and dressing applied.  Evaluation Blood flow good Complications: No apparent complications Patient did tolerate procedure well. Chest X-ray ordered to verify placement.  CXR: pending.   [name] RoachPaul [name], AGACNP-BC [facility] Pager [phone] or [phone]  US Consented [name] RossettiDaniel J. [name] [name], MD, FACP Pgr: [phone] [facility]

## 2014-10-06 NOTE — Progress Notes (Signed)
UR Completed.  [phone]  

## 2014-10-06 NOTE — Consult Note (Addendum)
WOC wound consult note VVS following for assessment and plan of care to BLE. Reason for Consult: Consult requested for multiple deep tissue injuries.  Family member at bedside during assessment; she looked at wounds during the consult. She reports pt fell at nursing home prior to admission. Wound type:Deep tissue injuries to several locations as follows: Anterior shaft of penis; 2X2cm, darker colored skin, no open areas or drainage. Right thigh approx 4X4cm darker colored skin, no open areas or drainage. Right thigh in a linear pattern, 8X.2cm, darker colored skin, no open areas or drainage. Right hip 10X5cm in patchy areas, darker colored skin, no open areas or drainage. Right buttock with circular shaped wound, approx 10X.2cm, darker colored skin, no open areas or drainage. Right buttock/Sacrum area  irregular shaped; approx 12X12cm, darker colored skin, beginning to have clear, fluid filled blisters which have not ruptured at this time. Pressure Ulcer POA: Yes  Dressing procedure/placement/frequency: Leave deep tissue injuries open to air.  Pt is frequently incontinent of stool and this could become trapped underneath dressing; it would be difficult to keep wound from becoming soiled.  Barrier cream to protect buttocks and sacrum.  Pt is on a Sport low-airloss bed to reduce pressure.  If transferred out of ICU, then air mattress replacement should be ordered.  Discussed plan of care with family member; deep tissue injuries may evolve into full thickness tissue loss within 7-10 days despite optimal care.  She verbalizes understanding.   Please re-consult if further assistance is needed.  Thank-you,  Cammie Mcgeeawn Aubry Tucholski MSN, RN, CWOCN, [place]AP, CNS [phone]

## 2014-10-06 NOTE — Progress Notes (Signed)
Pt is constantly wanting to lay on his right side. Education about changing side is provided to patient. Will continue to encourage to allow us to turn him to his left side.

## 2014-10-06 NOTE — Progress Notes (Signed)
Chaplain responded to consult for advanced directive.  Pt not competent to complete AD at this time according to MD.  Chaplain explained this to family and pt.  Will follow up as/if needed.    10/06/14 1100  Clinical Encounter Type  Visited With Patient and family together;Health care provider  Visit Type Initial;Critical Care  Referral From Nurse  Stress Factors  Patient Stress Factors Health changes  Family Stress Factors None identified   Blain PaisOvercash, Michall Noffke A, Chaplain  10/06/2014 11:59 AM

## 2014-10-07 ENCOUNTER — Encounter (HOSPITAL_COMMUNITY)
Admission: EM | Disposition: A | Discharge status: Skilled Nursing Facility | Payer: Self-pay | Source: Home / Self Care | Attending: Internal Medicine

## 2014-10-07 ENCOUNTER — Inpatient Hospital Stay (HOSPITAL_COMMUNITY): Payer: Medicare Other | Admitting: Certified Registered Nurse Anesthetist

## 2014-10-07 ENCOUNTER — Inpatient Hospital Stay (HOSPITAL_COMMUNITY): Payer: Medicare Other

## 2014-10-07 ENCOUNTER — Encounter (HOSPITAL_COMMUNITY): Payer: Self-pay | Admitting: Anesthesiology

## 2014-10-07 DIAGNOSIS — R400 Somnolence: Secondary | ICD-10-CM

## 2014-10-07 HISTORY — PX: AMPUTATION: SHX166

## 2014-10-07 LAB — CBC
HCT: 32.2 % — ABNORMAL LOW (ref 39.0–52.0)
HEMOGLOBIN: 11 g/dL — AB (ref 13.0–17.0)
MCH: 31.1 pg (ref 26.0–34.0)
MCHC: 34.2 g/dL (ref 30.0–36.0)
MCV: 91 fL (ref 78.0–100.0)
Platelets: 122 10*3/uL — ABNORMAL LOW (ref 150–400)
RBC: 3.54 MIL/uL — AB (ref 4.22–5.81)
RDW: 13.1 % (ref 11.5–15.5)
WBC: 28.1 10*3/uL — ABNORMAL HIGH (ref 4.0–10.5)

## 2014-10-07 LAB — URINE CULTURE: Colony Count: >=100000

## 2014-10-07 LAB — BASIC METABOLIC PANEL
Anion gap: 6 (ref 5–15)
BUN: 30 mg/dL — ABNORMAL HIGH (ref 6–23)
CO2: 21 mmol/L (ref 19–32)
Calcium: 7.7 mg/dL — ABNORMAL LOW (ref 8.4–10.5)
Chloride: 104 mmol/L (ref 96–112)
Creatinine, Ser: 1.3 mg/dL (ref 0.50–1.35)
GFR calc Af Amer: 61 mL/min — ABNORMAL LOW (ref >90)
GFR calc non Af Amer: 53 mL/min — ABNORMAL LOW (ref >90)
Glucose, Bld: 154 mg/dL — ABNORMAL HIGH (ref 70–99)
POTASSIUM: 3.4 mmol/L — AB (ref 3.5–5.1)
SODIUM: 131 mmol/L — AB (ref 135–145)

## 2014-10-07 LAB — GLUCOSE, CAPILLARY
GLUCOSE-CAPILLARY: 116 mg/dL — AB (ref 70–99)
Glucose-Capillary: 176 mg/dL — ABNORMAL HIGH (ref 70–99)
Glucose-Capillary: 154 mg/dL — ABNORMAL HIGH (ref 70–99)
Glucose-Capillary: 123 mg/dL — ABNORMAL HIGH (ref 70–99)
Glucose-Capillary: 132 mg/dL — ABNORMAL HIGH (ref 70–99)
Glucose-Capillary: 113 mg/dL — ABNORMAL HIGH (ref 70–99)
Glucose-Capillary: 92 mg/dL (ref 70–99)

## 2014-10-07 LAB — MAGNESIUM: Magnesium: 2 mg/dL (ref 1.5–2.5)

## 2014-10-07 LAB — HEPARIN LEVEL (UNFRACTIONATED): HEPARIN UNFRACTIONATED: 0.16 [IU]/mL — AB (ref 0.30–0.70)

## 2014-10-07 SURGERY — AMPUTATION, ABOVE KNEE
Anesthesia: General | Site: Leg Upper | Laterality: Right

## 2014-10-07 MED ORDER — HYDRALAZINE HCL 20 MG/ML IJ SOLN: 5.0000 mg | INTRAMUSCULAR | Status: DC | PRN

## 2014-10-07 MED ORDER — ONDANSETRON HCL 4 MG/2ML IJ SOLN: 4.0000 mg | Freq: Four times a day (QID) | INTRAMUSCULAR | Status: DC | PRN

## 2014-10-07 MED ORDER — BACITRACIN ZINC 500 UNIT/GM EX OINT
TOPICAL_OINTMENT | CUTANEOUS | Status: AC
  Filled 2014-10-07: qty 28.35

## 2014-10-07 MED ORDER — ALBUMIN HUMAN 5 % IV SOLN
INTRAVENOUS | Status: DC | PRN
  Administered 2014-10-07: 11:00:00 via INTRAVENOUS

## 2014-10-07 MED ORDER — NOREPINEPHRINE BITARTRATE 1 MG/ML IV SOLN
4000.0000 ug | INTRAVENOUS | Status: DC | PRN
Start: 2014-10-07 — End: 2014-10-07
  Administered 2014-10-07: 6 ug/min via INTRAVENOUS

## 2014-10-07 MED ORDER — LIDOCAINE HCL (CARDIAC) 20 MG/ML IV SOLN
INTRAVENOUS | Status: DC | PRN
  Administered 2014-10-07: 80 mg via INTRAVENOUS

## 2014-10-07 MED ORDER — SUCCINYLCHOLINE CHLORIDE 20 MG/ML IJ SOLN
INTRAMUSCULAR | Status: DC | PRN
  Administered 2014-10-07: 80 mg via INTRAVENOUS

## 2014-10-07 MED ORDER — HEPARIN BOLUS VIA INFUSION
2000.0000 [IU] | Freq: Once | INTRAVENOUS | Status: AC
  Administered 2014-10-07: 2000 [IU] via INTRAVENOUS
  Filled 2014-10-07: qty 2000

## 2014-10-07 MED ORDER — PROPOFOL 10 MG/ML IV BOLUS
INTRAVENOUS | Status: DC | PRN
  Administered 2014-10-07: 30 mg via INTRAVENOUS
  Administered 2014-10-07: 80 mg via INTRAVENOUS

## 2014-10-07 MED ORDER — EPHEDRINE SULFATE 50 MG/ML IJ SOLN
INTRAMUSCULAR | Status: AC
  Filled 2014-10-07: qty 1

## 2014-10-07 MED ORDER — LACTATED RINGERS IV SOLN: INTRAVENOUS | Status: DC

## 2014-10-07 MED ORDER — FENTANYL CITRATE 0.05 MG/ML IJ SOLN
INTRAMUSCULAR | Status: AC
  Filled 2014-10-07: qty 5

## 2014-10-07 MED ORDER — PHENOL 1.4 % MT LIQD
1.0000 | OROMUCOSAL | Status: DC | PRN
Start: 2014-10-07 — End: 2014-10-11
  Filled 2014-10-07: qty 177

## 2014-10-07 MED ORDER — VANCOMYCIN HCL IN DEXTROSE 750-5 MG/150ML-% IV SOLN
750.0000 mg | Freq: Two times a day (BID) | INTRAVENOUS | Status: DC
  Administered 2014-10-07 – 2014-10-08 (×3): 750 mg via INTRAVENOUS
  Filled 2014-10-07 (×5): qty 150

## 2014-10-07 MED ORDER — BACITRACIN ZINC 500 UNIT/GM EX OINT
TOPICAL_OINTMENT | CUTANEOUS | Status: DC | PRN
  Administered 2014-10-07: 1 via TOPICAL

## 2014-10-07 MED ORDER — PANTOPRAZOLE SODIUM 40 MG IV SOLR
40.0000 mg | INTRAVENOUS | Status: DC
  Administered 2014-10-07 – 2014-10-08 (×2): 40 mg via INTRAVENOUS
  Filled 2014-10-07 (×3): qty 40

## 2014-10-07 MED ORDER — PANTOPRAZOLE SODIUM 40 MG PO TBEC: 40.0000 mg | DELAYED_RELEASE_TABLET | Freq: Every day | ORAL | Status: DC

## 2014-10-07 MED ORDER — ACETAMINOPHEN 650 MG RE SUPP: 325.0000 mg | RECTAL | Status: DC | PRN

## 2014-10-07 MED ORDER — PRAVASTATIN SODIUM 80 MG PO TABS
80.0000 mg | ORAL_TABLET | Freq: Every evening | ORAL | Status: DC
  Administered 2014-10-08 – 2014-10-10 (×3): 80 mg via ORAL
  Filled 2014-10-07 (×5): qty 1

## 2014-10-07 MED ORDER — FENTANYL CITRATE 0.05 MG/ML IJ SOLN
INTRAMUSCULAR | Status: DC | PRN
  Administered 2014-10-07: 50 ug via INTRAVENOUS
  Administered 2014-10-07 (×3): 100 ug via INTRAVENOUS
  Administered 2014-10-07: 50 ug via INTRAVENOUS
  Administered 2014-10-07: 100 ug via INTRAVENOUS

## 2014-10-07 MED ORDER — POTASSIUM CHLORIDE 10 MEQ/50ML IV SOLN
10.0000 meq | INTRAVENOUS | Status: AC
  Administered 2014-10-07 (×4): 10 meq via INTRAVENOUS
  Filled 2014-10-07 (×4): qty 50

## 2014-10-07 MED ORDER — HEPARIN SODIUM (PORCINE) 5000 UNIT/ML IJ SOLN
5000.0000 [IU] | Freq: Three times a day (TID) | INTRAMUSCULAR | Status: DC
  Administered 2014-10-07 – 2014-10-11 (×12): 5000 [IU] via SUBCUTANEOUS
  Filled 2014-10-07 (×12): qty 1

## 2014-10-07 MED ORDER — LIDOCAINE HCL (CARDIAC) 20 MG/ML IV SOLN
INTRAVENOUS | Status: AC
  Filled 2014-10-07: qty 5

## 2014-10-07 MED ORDER — ROCURONIUM BROMIDE 50 MG/5ML IV SOLN
INTRAVENOUS | Status: AC
  Filled 2014-10-07: qty 1

## 2014-10-07 MED ORDER — ALUM & MAG HYDROXIDE-SIMETH 200-200-20 MG/5ML PO SUSP
15.0000 mL | ORAL | Status: DC | PRN
  Filled 2014-10-07: qty 30

## 2014-10-07 MED ORDER — MIDAZOLAM HCL 5 MG/5ML IJ SOLN
INTRAMUSCULAR | Status: DC | PRN
  Administered 2014-10-07: 1 mg via INTRAVENOUS

## 2014-10-07 MED ORDER — 0.9 % SODIUM CHLORIDE (POUR BTL) OPTIME
TOPICAL | Status: DC | PRN
  Administered 2014-10-07: 1000 mL

## 2014-10-07 MED ORDER — PROPOFOL 10 MG/ML IV BOLUS
INTRAVENOUS | Status: AC
  Filled 2014-10-07: qty 20

## 2014-10-07 MED ORDER — CITALOPRAM HYDROBROMIDE 40 MG PO TABS
40.0000 mg | ORAL_TABLET | Freq: Every day | ORAL | Status: DC
  Administered 2014-10-09 – 2014-10-11 (×3): 40 mg via ORAL
  Filled 2014-10-07 (×5): qty 1

## 2014-10-07 MED ORDER — HYDROCODONE-ACETAMINOPHEN 5-325 MG PO TABS
1.0000 | ORAL_TABLET | ORAL | Status: DC | PRN
  Administered 2014-10-08 – 2014-10-10 (×6): 1 via ORAL
  Filled 2014-10-07 (×6): qty 1

## 2014-10-07 MED ORDER — LABETALOL HCL 5 MG/ML IV SOLN
10.0000 mg | INTRAVENOUS | Status: DC | PRN
  Filled 2014-10-07: qty 4

## 2014-10-07 MED ORDER — MIDAZOLAM HCL 2 MG/2ML IJ SOLN
INTRAMUSCULAR | Status: AC
  Filled 2014-10-07: qty 2

## 2014-10-07 MED ORDER — SODIUM CHLORIDE 0.9 % IV BOLUS (SEPSIS)
500.0000 mL | Freq: Once | INTRAVENOUS | Status: AC
Start: 2014-10-07 — End: 2014-10-07
  Administered 2014-10-07: 500 mL via INTRAVENOUS

## 2014-10-07 MED ORDER — SODIUM CHLORIDE 0.9 % IJ SOLN
INTRAMUSCULAR | Status: AC
  Filled 2014-10-07: qty 10

## 2014-10-07 MED ORDER — ONDANSETRON HCL 4 MG/2ML IJ SOLN
INTRAMUSCULAR | Status: AC
  Filled 2014-10-07: qty 2

## 2014-10-07 MED ORDER — PHENYLEPHRINE 40 MCG/ML (10ML) SYRINGE FOR IV PUSH (FOR BLOOD PRESSURE SUPPORT)
PREFILLED_SYRINGE | INTRAVENOUS | Status: AC
  Filled 2014-10-07: qty 10

## 2014-10-07 MED ORDER — ACETAMINOPHEN 325 MG PO TABS: 325.0000 mg | ORAL_TABLET | ORAL | Status: DC | PRN

## 2014-10-07 MED ORDER — METOPROLOL TARTRATE 1 MG/ML IV SOLN: 2.0000 mg | INTRAVENOUS | Status: DC | PRN

## 2014-10-07 MED ORDER — ONDANSETRON HCL 4 MG/2ML IJ SOLN
INTRAMUSCULAR | Status: DC | PRN
  Administered 2014-10-07: 4 mg via INTRAVENOUS

## 2014-10-07 MED ORDER — DOCUSATE SODIUM 100 MG PO CAPS
200.0000 mg | ORAL_CAPSULE | Freq: Two times a day (BID) | ORAL | Status: DC
  Administered 2014-10-08 – 2014-10-11 (×4): 200 mg via ORAL
  Filled 2014-10-07 (×11): qty 2

## 2014-10-07 MED ORDER — GUAIFENESIN-DM 100-10 MG/5ML PO SYRP
15.0000 mL | ORAL_SOLUTION | ORAL | Status: DC | PRN
  Filled 2014-10-07: qty 15

## 2014-10-07 SURGICAL SUPPLY — 55 items
BANDAGE ELASTIC 6 VELCRO ST LF (GAUZE/BANDAGES/DRESSINGS) ×4 IMPLANT
BANDAGE ESMARK 6X9 LF (GAUZE/BANDAGES/DRESSINGS) IMPLANT
BLADE SAW RECIP 87.9 MT (BLADE) ×3 IMPLANT
BNDG CMPR 9X6 STRL LF SNTH (GAUZE/BANDAGES/DRESSINGS)
BNDG COHESIVE 6X5 TAN STRL LF (GAUZE/BANDAGES/DRESSINGS) ×3 IMPLANT
BNDG ESMARK 6X9 LF (GAUZE/BANDAGES/DRESSINGS)
BNDG GAUZE ELAST 4 BULKY (GAUZE/BANDAGES/DRESSINGS) ×3 IMPLANT
CANISTER SUCTION 2500CC (MISCELLANEOUS) ×3 IMPLANT
CLIP TI MEDIUM 6 (CLIP) ×2 IMPLANT
COVER SURGICAL LIGHT HANDLE (MISCELLANEOUS) ×3 IMPLANT
CUFF TOURNIQUET SINGLE 18IN (TOURNIQUET CUFF) IMPLANT
CUFF TOURNIQUET SINGLE 24IN (TOURNIQUET CUFF) IMPLANT
CUFF TOURNIQUET SINGLE 34IN LL (TOURNIQUET CUFF) IMPLANT
CUFF TOURNIQUET SINGLE 44IN (TOURNIQUET CUFF) IMPLANT
DRAIN CHANNEL 19F RND (DRAIN) IMPLANT
DRAPE ORTHO SPLIT 77X108 STRL (DRAPES) ×6
DRAPE PROXIMA HALF (DRAPES) ×3 IMPLANT
DRAPE SURG ORHT 6 SPLT 77X108 (DRAPES) ×2 IMPLANT
DRAPE U-SHAPE 47X51 STRL (DRAPES) ×3 IMPLANT
DRSG ADAPTIC 3X8 NADH LF (GAUZE/BANDAGES/DRESSINGS) ×3 IMPLANT
ELECT REM PT RETURN 9FT ADLT (ELECTROSURGICAL) ×3
ELECTRODE REM PT RTRN 9FT ADLT (ELECTROSURGICAL) ×1 IMPLANT
EVACUATOR SILICONE 100CC (DRAIN) IMPLANT
FACESHIELD STD STERILE (MASK) ×2 IMPLANT
GAUZE SPONGE 4X4 12PLY STRL (GAUZE/BANDAGES/DRESSINGS) ×3 IMPLANT
GLOVE BIO SURGEON STRL SZ 6.5 (GLOVE) ×1 IMPLANT
GLOVE BIO SURGEON STRL SZ7.5 (GLOVE) ×3 IMPLANT
GLOVE BIO SURGEONS STRL SZ 6.5 (GLOVE) ×1
GLOVE BIOGEL PI IND STRL 6.5 (GLOVE) IMPLANT
GLOVE BIOGEL PI IND STRL 8 (GLOVE) ×1 IMPLANT
GLOVE BIOGEL PI INDICATOR 6.5 (GLOVE) ×4
GLOVE BIOGEL PI INDICATOR 8 (GLOVE) ×4
GOWN STRL REUS W/ TWL LRG LVL3 (GOWN DISPOSABLE) ×3 IMPLANT
GOWN STRL REUS W/TWL LRG LVL3 (GOWN DISPOSABLE) ×9
KIT BASIN OR (CUSTOM PROCEDURE TRAY) ×3 IMPLANT
KIT ROOM TURNOVER OR (KITS) ×3 IMPLANT
NS IRRIG 1000ML POUR BTL (IV SOLUTION) ×3 IMPLANT
PACK GENERAL/GYN (CUSTOM PROCEDURE TRAY) ×3 IMPLANT
PAD ARMBOARD 7.5X6 YLW CONV (MISCELLANEOUS) ×6 IMPLANT
PADDING CAST COTTON 6X4 STRL (CAST SUPPLIES) IMPLANT
RASP HELIOCORDIAL MED (MISCELLANEOUS) ×2 IMPLANT
SPONGE GAUZE 4X4 12PLY STER LF (GAUZE/BANDAGES/DRESSINGS) ×2 IMPLANT
SPONGE LAP 18X18 X RAY DECT (DISPOSABLE) ×2 IMPLANT
STAPLER VISISTAT (STAPLE) ×3 IMPLANT
STOCKINETTE IMPERVIOUS LG (DRAPES) ×3 IMPLANT
SUT ETHILON 3 0 PS 1 (SUTURE) IMPLANT
SUT SILK 0 TIES 10X30 (SUTURE) ×3 IMPLANT
SUT SILK 2 0 (SUTURE) ×3
SUT SILK 2 0 SH CR/8 (SUTURE) ×3 IMPLANT
SUT SILK 2-0 18XBRD TIE 12 (SUTURE) ×1 IMPLANT
SUT SILK 3 0 (SUTURE) ×3
SUT SILK 3-0 18XBRD TIE 12 (SUTURE) ×1 IMPLANT
SUT VIC AB 2-0 CT1 18 (SUTURE) ×3 IMPLANT
UNDERPAD 30X30 INCONTINENT (UNDERPADS AND DIAPERS) ×3 IMPLANT
WATER STERILE IRR 1000ML POUR (IV SOLUTION) ×3 IMPLANT

## 2014-10-07 NOTE — Progress Notes (Signed)
ANTICOAGULATION CONSULT NOTE - Follow Up Consult  Pharmacy Consult for heparin Indication: pulmonary embolus, DVT, and ischemic leg  Labs:  Recent Labs  10/05/14 1503 10/05/14 1516 10/06/14 0434 10/06/14 2230 10/07/14 0448 10/07/14 0449  HGB 11.6* 11.6* 11.8*  --   --   --   HCT 43.5 34.0* 34.3*  --   --   --   PLT 174  --  136*  --   --   --   LABPROT 17.6*  --   --   --   --   --   INR 1.43  --   --   --   --   --   HEPARINUNFRC  --   --   --  0.37 0.16*  --   CREATININE 2.77* 2.70* 1.89*  --   --  1.30  TROPONINI 0.03  --   --   --   --   --     Assessment: 73yo male now subtherapeutic on heparin after one level at lower end of goal.  Goal of Therapy:  Heparin level 0.3-0.7 units/ml   Plan:  Will rebolus with heparin 2000 units and increase gtt by 3 units/kg/hr to 1600 units/hr and check level in 8hr.  Vernard GamblesVeronda Nettie Cromwell, PharmD, BCPS  10/07/2014,5:38 AM

## 2014-10-07 NOTE — Progress Notes (Addendum)
Patient's HR (regular rate on monitor) was sustained in the 140's for about 10 minutes.  Patient denied chest pain. E-link notified.  Patient's HR corrected without any intervention.
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
## 2014-10-07 NOTE — Progress Notes (Signed)
eLink Physician-Brief Progress Note Patient Name: [name] [name] DOB: 04/05/1941 MRN: [id number]   Date of Service  10/07/2014  HPI/Events of Note  Called d/t multiple issues: 1. Patient is NPO and has been started on PO meds, 2. CVP = 3 and urine output marginal and 3. Heparin IV infusion D/Ced. Platelet count = 122K. Creatinine = 2.70. Hx of DVT and PE.  eICU Interventions  Will order:  1. Change Protonix PO to IV. All other PO meds can be held pending swallow evaluation in AM. 2. Bolus with 0.9 NaCl 500 mL IV over 30 minutes now.  3. Heparin 5000 Units [place] Q 8 hours. Follow platelets count.      Intervention Category Minor Interventions: Communication with other healthcare providers and/or family;Routine modifications to care plan (e.g. PRN medications for pain, fever)  Sommer,Steven Eugene 10/07/2014, 7:48 PM

## 2014-10-07 NOTE — Progress Notes (Signed)
CRNA at bedside.

## 2014-10-07 NOTE — Transfer of Care (Signed)
Immediate Anesthesia Transfer of Care Note  Patient: [name] SawyersDavid T Heckman  Procedure(s) Performed: Procedure(s): Right Above Knee Amputation  (Right)  Patient Location: PACU  Anesthesia Type:General  Level of Consciousness: awake, sedated and responds to stimulation  Airway & Oxygen Therapy: Patient Spontanous Breathing and Patient connected to nasal cannula oxygen  Post-op Assessment: Report given to RN, Post -op Vital signs reviewed and stable and Patient moving all extremities  Post vital signs: Reviewed and stable  Last Vitals:  Filed Vitals:   10/07/14 0950  BP: 116/67  Pulse: 65  Temp:   Resp: 24    Complications: No apparent anesthesia complications

## 2014-10-07 NOTE — Anesthesia Postprocedure Evaluation (Signed)
  Anesthesia Post-op Note  Patient: [name] SawyersDavid T [name]  Procedure(s) Performed: Procedure(s): Right Above Knee Amputation  (Right)  Patient Location: PACU  Anesthesia Type:General  Level of Consciousness: awake and alert   Airway and Oxygen Therapy: Patient Spontanous Breathing and Patient connected to nasal cannula oxygen  Post-op Pain: mild  Post-op Assessment: Post-op Vital signs reviewed, Patient's Cardiovascular Status Stable, Respiratory Function Stable and Patent Airway  Post-op Vital Signs: Reviewed and stable  Last Vitals:  Filed Vitals:   10/07/14 0950  BP: 116/67  Pulse: 65  Temp:   Resp: 24    Complications: No apparent anesthesia complications

## 2014-10-07 NOTE — Op Note (Signed)
    NAME: [name] SawyersDavid T [name]   MRN: [id number] DOB: [birth year]    DATE OF OPERATION: 10/07/2014  PREOP DIAGNOSIS: Ischemic right lower extremity  POSTOP DIAGNOSIS: Same  PROCEDURE: Right above-the-knee amputation  SURGEON: Di Kindlehristopher S. Edilia Boickson, MD, FACS  ASSIST: Karsten RoKim Trinh, [facility]  ANESTHESIA: Gen.   EBL: 100 mL  INDICATIONS: [name] [name] is a 74 y.o. male with an ischemic right lower extremity. The limb was not salvageable and I was asked to proceed with right above-the-knee amputation.  FINDINGS: The muscle appeared marginally well perfused. There was no active infection. There was a rod in the femur which I was able to get through with a bur.  TECHNIQUE: The patient was taken to the operative room and received general anesthetic. The right lower extremity was prepped and draped in the usual sterile fashion. A fishmouth incision was made after the skin was marked in the proximal thigh. The dissection was carried down through the skin, subcutaneous tissue, fascia, and muscle to the femur. The muscle was divided using electrocautery. The femoral artery and vein were individually suture ligated with 2-0 silk eyes. The dissection was carried down to the bone which had been previously fractured a rod in place. I elevated the periosteum and then divided the bone around the metal rod circumferentially. I then divided the metal rod using a bur. The leg was then removed and sent to pathology. The wound was then irrigated with Topamax amounts of saline to try to remove as much metal fragments as possible. Hemostasis was obtained using electrocautery and 2-0 silk ties. The sciatic nerve was retracted into the wound and sharply divided. The fascial layer was closed with interrupted 20 Vicryls. The skin was closed with staples. Sterile dressing was applied. The patient tolerated well and was transferred to recovery when stable condition. All needle and sponge counts were correct.  Waverly Ferrarihristopher Dickson, MD,  FACS [facility]  DATE OF DICTATION:   10/07/2014

## 2014-10-07 NOTE — Progress Notes (Signed)
Subjective  - HD #2  Patient more alert this morning.  He will nod his head to answer questions.  He seems to indicate that his right leg bothers him.   Physical Exam:  He is able to move his left foot and toes.  He cannot move his right foot or toes.  No audible Doppler signal is obtained in the right foot.  A monophasic arterial signal is heard in the right femoral artery which is improved from yesterday.  The right leg is warm beginning just below the patella.       Assessment/Plan:  HD #2  The patient has improved.  His pressor requirement is down.  His creatinine has improved.  He has an ischemic right leg which is not salvageable.  He needs a right above-knee amputation before the dead extremity causes his clinical condition to deteriorate.  I discussed this with the patient and he nodded his head in agreement that he wanted to have an amputation.  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  He continues to have poor femoral artery Doppler signals.  Potentially his aneurysm has caused thrombosis of his iliac vessels.  This cannot be evaluated currently because of his renal function and the need for IV contrast.  I would delay any intervention regarding his aneurysm for later time.  Currently the most pressing issue is his dead right leg which will cause him to get sick in the near future and therefore plans will be for right above-knee amputation.  Because the tissue is warm down to the patella I suspect he has adequate blood flow to heal an above-knee amputation however he could require a more proximal revision if it does not heal.  Omero Kowal IV, V. WELLS 10/07/2014 8:06 AM --  Filed Vitals:   10/07/14 0600  BP: 110/60  Pulse: 71  Temp:   Resp: 27    Intake/Output Summary (Last 24 hours) at 10/07/14 0806 Last data filed at 10/07/14 [id number]  Gross per 24 hour  Intake 4371.23 ml  Output   1415 ml  Net 2956.23 ml     Laboratory CBC    Component Value Date/Time   WBC 28.1* 10/07/2014 0449   HGB 11.0* 10/07/2014 0449   HCT 32.2* 10/07/2014 0449   PLT 122* 10/07/2014 0449    BMET    Component Value Date/Time   NA 131* 10/07/2014 0449   K 3.4* 10/07/2014 0449   CL 104 10/07/2014 0449   CO2 21 10/07/2014 0449   GLUCOSE 154* 10/07/2014 0449   BUN 30* 10/07/2014 0449   CREATININE 1.30 10/07/2014 0449   CREATININE 1.23 12/07/2013 0812   CALCIUM 7.7* 10/07/2014 0449   GFRNONAA 53* 10/07/2014 0449   GFRNONAA 58* 12/07/2013 0812   GFRAA 61* 10/07/2014 0449   GFRAA 67 12/07/2013 0812    COAG Lab Results  Component Value Date   INR 1.43 10/05/2014   INR 1.46 06/03/2013   INR 2.3 04/08/2013   No results found for: PTT  Antibiotics Anti-infectives    Start     Dose/Rate Route Frequency Ordered Stop   10/07/14 0830  vancomycin (VANCOCIN) IVPB 750 mg/150 ml premix     750 mg 150 mL/hr over 60 Minutes Intravenous Every 12 hours 10/07/14 0744     10/05/14 1800  piperacillin-tazobactam (ZOSYN) IVPB 3.375 g     3.375 g 12.5 mL/hr over 240 Minutes Intravenous Every 8 hours 10/05/14 1640     10/05/14 1700  vancomycin (VANCOCIN) IVPB  1000 mg/200 mL premix  Status:  Discontinued     1,000 mg 200 mL/hr over 60 Minutes Intravenous Every 24 hours 10/05/14 1639 10/07/14 0743   10/05/14 1630  piperacillin-tazobactam (ZOSYN) IVPB 3.375 g  Status:  Discontinued     3.375 g 100 mL/hr over 30 Minutes Intravenous  Once 10/05/14 1629 10/05/14 1640   10/05/14 1630  vancomycin (VANCOCIN) IVPB 1000 mg/200 mL premix  Status:  Discontinued     1,000 mg 200 mL/hr over 60 Minutes Intravenous  Once 10/05/14 1629 10/05/14 1639       V. Charlena Cross, M.D. [facility] [place] Office: [phone] Pager:  [phone]

## 2014-10-07 NOTE — Progress Notes (Signed)
ANTIBIOTIC CONSULT NOTE - Follow-up  Pharmacy Consult for Vancomycin & Zosyn Indication: Sepsis  No Known Allergies  Patient Measurements: Weight: 193 lb (87.544 kg)  Vital Signs: Temp: 99.3 F (37.4 C) (03/03 0449) Temp Source: Oral (03/03 0449) BP: 110/60 mmHg (03/03 0600) Pulse Rate: 71 (03/03 0600) Intake/Output from previous day: 03/02 0701 - 03/03 0700 In: 5250 [I.V.:4900; IV Piggyback:350] Out: 1565 [Urine:1565] Intake/Output from this shift:    Labs:  Recent Labs  10/05/14 1503 10/05/14 1516 10/06/14 0434 10/07/14 0449  WBC 25.4*  --  25.0* 28.1*  HGB 11.6* 11.6* 11.8* 11.0*  PLT 174  --  136* 122*  CREATININE 2.77* 2.70* 1.89* 1.30   Estimated Creatinine Clearance: 55.4 mL/min (by C-G formula based on Cr of 1.3). No results for input(s): VANCOTROUGH, VANCOPEAK, VANCORANDOM, GENTTROUGH, GENTPEAK, GENTRANDOM, TOBRATROUGH, TOBRAPEAK, TOBRARND, AMIKACINPEAK, AMIKACINTROU, AMIKACIN in the last 72 hours.   Microbiology: Recent Results (from the past 720 hour(s))  Blood Culture (routine x 2)     Status: None (Preliminary result)   Collection Time: 10/05/14  4:45 PM  Result Value Ref Range Status   Specimen Description BLOOD RIGHT ARM  Final   Special Requests BOTTLES DRAWN AEROBIC AND ANAEROBIC 8CC  Final   Culture NO GROWTH 1 DAY  Final   Report Status PENDING  Incomplete  Blood Culture (routine x 2)     Status: None (Preliminary result)   Collection Time: 10/05/14  4:50 PM  Result Value Ref Range Status   Specimen Description BLOOD RIGHT HAND  Final   Special Requests BOTTLES DRAWN AEROBIC AND ANAEROBIC 8CC  Final   Culture NO GROWTH 1 DAY  Final   Report Status PENDING  Incomplete  MRSA PCR Screening     Status: None   Collection Time: 10/05/14  8:55 PM  Result Value Ref Range Status   MRSA by PCR NEGATIVE NEGATIVE Final    Comment:        The GeneXpert MRSA Assay (FDA approved for NASAL specimens only), is one component of a comprehensive MRSA  colonization surveillance program. It is not intended to diagnose MRSA infection nor to guide or monitor treatment for MRSA infections.   Clostridium Difficile by PCR     Status: None   Collection Time: 10/06/14  6:09 AM  Result Value Ref Range Status   C difficile by pcr NEGATIVE NEGATIVE Final   Assessment: [age] yom presented to the hospital with AMS with no BLE pulses. Initiated on vancomycin and zosyn for treatment of possible sepsis. Pt is afebrile but WBC remains elevated at 28.1. Scr has improved greatly and is now down to 1.3.   Vanc 3/1>> Zosyn 3/1>>  3/2 Cdiff - NEG 3/1 Blood - NGTD  Goal of Therapy:  Vancomycin trough level 15-20 mcg/ml  Plan:  1. Change vancomycin to 750mg  IV Q12H 2. Continue zosyn 3.375gm IV Q8H (4 hr inf) 3. F/u renal fxn, C&S, clinical status [facility]  Birgitta Uhlir, PharmD, BCPS Pager # [phone] 10/07/2014 7:48 AM

## 2014-10-07 NOTE — Anesthesia Preprocedure Evaluation (Addendum)
Anesthesia Evaluation  Patient identified by MRN, date of birth, ID band Patient awake    Airway Mallampati: II   Neck ROM: Full    Dental  (+) Edentulous Upper, Edentulous Lower   Pulmonary COPDCurrent Smoker,  breath sounds clear to auscultation        Cardiovascular hypertension, + CAD and + Peripheral Vascular Disease Rhythm:Regular  EF 40   Neuro/Psych Depression CVA    GI/Hepatic GERD-  Medicated,  Endo/Other  diabetes, Type 2  Renal/GU Renal InsufficiencyRenal diseaseGFR 45     Musculoskeletal   Abdominal (+)  Abdomen: soft.    Peds  Hematology 11/32   Anesthesia Other Findings   Reproductive/Obstetrics                            Anesthesia Physical Anesthesia Plan  ASA: IV  Anesthesia Plan: General   Post-op Pain Management:    Induction: Intravenous  Airway Management Planned: Oral ETT  Additional Equipment:   Intra-op Plan:   Post-operative Plan: Extubation in OR  Informed Consent: I have reviewed the patients History and Physical, chart, labs and discussed the procedure including the risks, benefits and alternatives for the proposed anesthesia with the patient or authorized representative who has indicated his/her understanding and acceptance.     Plan Discussed with:   Anesthesia Plan Comments:         Anesthesia Quick Evaluation

## 2014-10-08 ENCOUNTER — Encounter (HOSPITAL_COMMUNITY): Payer: Self-pay | Admitting: Vascular Surgery

## 2014-10-08 DIAGNOSIS — I96 Gangrene, not elsewhere classified: Secondary | ICD-10-CM | POA: Diagnosis present

## 2014-10-08 LAB — BASIC METABOLIC PANEL
Anion gap: 7 (ref 5–15)
BUN: 21 mg/dL (ref 6–23)
CHLORIDE: 109 mmol/L (ref 96–112)
CO2: 18 mmol/L — AB (ref 19–32)
CREATININE: 0.96 mg/dL (ref 0.50–1.35)
Calcium: 7.7 mg/dL — ABNORMAL LOW (ref 8.4–10.5)
GFR calc Af Amer: >90 mL/min (ref >90)
GFR calc non Af Amer: 80 mL/min — ABNORMAL LOW (ref >90)
Glucose, Bld: 107 mg/dL — ABNORMAL HIGH (ref 70–99)
Potassium: 3.6 mmol/L (ref 3.5–5.1)
Sodium: 134 mmol/L — ABNORMAL LOW (ref 135–145)

## 2014-10-08 LAB — CBC
HEMATOCRIT: 26.2 % — AB (ref 39.0–52.0)
Hemoglobin: 9.2 g/dL — ABNORMAL LOW (ref 13.0–17.0)
MCH: 31.4 pg (ref 26.0–34.0)
MCHC: 35.1 g/dL (ref 30.0–36.0)
MCV: 89.4 fL (ref 78.0–100.0)
Platelets: 92 10*3/uL — ABNORMAL LOW (ref 150–400)
RBC: 2.93 MIL/uL — ABNORMAL LOW (ref 4.22–5.81)
RDW: 13.2 % (ref 11.5–15.5)
WBC: 17.8 10*3/uL — ABNORMAL HIGH (ref 4.0–10.5)

## 2014-10-08 LAB — GLUCOSE, CAPILLARY
GLUCOSE-CAPILLARY: 86 mg/dL (ref 70–99)
GLUCOSE-CAPILLARY: 90 mg/dL (ref 70–99)
Glucose-Capillary: 100 mg/dL — ABNORMAL HIGH (ref 70–99)
Glucose-Capillary: 110 mg/dL — ABNORMAL HIGH (ref 70–99)
Glucose-Capillary: 112 mg/dL — ABNORMAL HIGH (ref 70–99)
Glucose-Capillary: 90 mg/dL (ref 70–99)

## 2014-10-08 LAB — HEPARIN LEVEL (UNFRACTIONATED)

## 2014-10-08 MED ORDER — CIPROFLOXACIN HCL 500 MG PO TABS
500.0000 mg | ORAL_TABLET | Freq: Two times a day (BID) | ORAL | Status: DC
  Administered 2014-10-08 – 2014-10-11 (×6): 500 mg via ORAL
  Filled 2014-10-08 (×8): qty 1

## 2014-10-08 MED ORDER — STARCH (THICKENING) PO POWD
ORAL | Status: DC | PRN
  Filled 2014-10-08: qty 227

## 2014-10-08 NOTE — Progress Notes (Signed)
    Subjective  - POD #1  Family at bedside Resting comfortably   Physical Exam:  Dressing intact       Assessment/Plan:  POD #1  Lengthy discussion with family (daughter and grandson) Discussed plan for management of AAA:  Will wait until patient recovers and if renal function ok, will get a CTA.  Nothing to do immediately Will continue to monitor R LE stump t make sure there is adequate blood flow to heal incision Discussed reasons for events which occurred when he arrived and indications for amputation  Kensi Karr IV, V. WELLS 10/08/2014 9:24 AM --  Ceasar MonsFiled Vitals:   10/08/14 0800  BP: 101/72  Pulse: 62  Temp:   Resp: 20    Intake/Output Summary (Last 24 hours) at 10/08/14 0924 Last data filed at 10/08/14 0856  Gross per 24 hour  Intake 3668.46 ml  Output    900 ml  Net 2768.46 ml     Laboratory CBC    Component Value Date/Time   WBC 17.8* 10/08/2014 0500   HGB 9.2* 10/08/2014 0500   HCT 26.2* 10/08/2014 0500   PLT 92* 10/08/2014 0500    BMET    Component Value Date/Time   NA 134* 10/08/2014 0500   K 3.6 10/08/2014 0500   CL 109 10/08/2014 0500   CO2 18* 10/08/2014 0500   GLUCOSE 107* 10/08/2014 0500   BUN 21 10/08/2014 0500   CREATININE 0.96 10/08/2014 0500   CREATININE 1.23 12/07/2013 0812   CALCIUM 7.7* 10/08/2014 0500   GFRNONAA 80* 10/08/2014 0500   GFRNONAA 58* 12/07/2013 0812   GFRAA >90 10/08/2014 0500   GFRAA 67 12/07/2013 0812    COAG Lab Results  Component Value Date   INR 1.43 10/05/2014   INR 1.46 06/03/2013   INR 2.3 04/08/2013   No results found for: PTT  Antibiotics Anti-infectives    Start     Dose/Rate Route Frequency Ordered Stop   10/07/14 0830  vancomycin (VANCOCIN) IVPB 750 mg/150 ml premix     750 mg 150 mL/hr over 60 Minutes Intravenous Every 12 hours 10/07/14 0744     10/05/14 1800  piperacillin-tazobactam (ZOSYN) IVPB 3.375 g     3.375 g 12.5 mL/hr over 240 Minutes Intravenous Every 8 hours 10/05/14 1640      10/05/14 1700  vancomycin (VANCOCIN) IVPB 1000 mg/200 mL premix  Status:  Discontinued     1,000 mg 200 mL/hr over 60 Minutes Intravenous Every 24 hours 10/05/14 1639 10/07/14 0743   10/05/14 1630  piperacillin-tazobactam (ZOSYN) IVPB 3.375 g  Status:  Discontinued     3.375 g 100 mL/hr over 30 Minutes Intravenous  Once 10/05/14 1629 10/05/14 1640   10/05/14 1630  vancomycin (VANCOCIN) IVPB 1000 mg/200 mL premix  Status:  Discontinued     1,000 mg 200 mL/hr over 60 Minutes Intravenous  Once 10/05/14 1629 10/05/14 1639       V. Charlena CrossWells Forest Pruden IV, M.D. [facility] [place] Office: [phone] Pager:  [phone]

## 2014-10-08 NOTE — Progress Notes (Addendum)
Transfer note:  Arrival Method: Bed Mental Orientation:A&Ox2 Telemetry: Box 6E06 Assessment: See docflowsheet Skin: Possible ruptured blisters on sacrum IV: L Forearm Pain:Denies  Tubes: Foley cath Safety Measures: bed in lowest position; call light within reach  6700 Orientation: Patient has been oriented to the unit, staff and to the room.
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
## 2014-10-08 NOTE — Progress Notes (Signed)
Physical medicine rehabilitation consult requested chart reviewed. Patient currently resides in a skilled nursing facility [facility] nursing home essentially wheelchair bound prior to admission. Patient status post right AKA. Hold on formal rehabilitation consult at this time and recommendations were to be returned back to skilled nursing facility

## 2014-10-08 NOTE — Progress Notes (Signed)
PULMONARY / CRITICAL CARE MEDICINE   Name: [name] [name] MRN: [id number] DOB: 1940/11/27    ADMISSION DATE:  10/05/2014 CONSULTATION DATE:  10/05/2014  REFERRING MD :  AP EDP  CHIEF COMPLAINT:  AMS  INITIAL PRESENTATION: 74 year old male with multiple medical problems to ED from SNF 3/1 with AMS and one week of lower extremity discoloration per family. In ED was hypotensive requiring pressors, unable assess BLE pulses and mottling of lower extremities. Transferred to [facility] for vascular surgery evaluation and ICU admission.   STUDIES:  3/1 CT head > Atrophy with small vessel disease, stable. Encephalomalacia in both frontal lobes consistent with the previous aneurysm clipping surgery. No acute infarct apparent. 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 Correlation with the peripheral pulses in the groin are recommended given the patient's clinical history and there may be occlusive changes within these aneurysms.  SIGNIFICANT EVENTS: 3/1 Admit from SNF 3/2 UA significant for UTI 3/2 VVS evaluation, patient not candidate for revascularization 3/3 Right AKA  3/4- off pressors  SUBJECTIVE: BP good  VITAL SIGNS: Temp:  [97.4 F (36.3 C)-99.4 F (37.4 C)] 98.1 F (36.7 C) (03/04 1207) Pulse Rate:  [38-125] 65 (03/04 1400) Resp:  [13-22] 13 (03/04 1400) BP: (83-136)/(52-82) 111/61 mmHg (03/04 1400) SpO2:  [87 %-100 %] 100 % (03/04 1400) HEMODYNAMICS: CVP:  [0 mmHg-6 mmHg] 5 mmHg   VENTILATOR SETTINGS:   INTAKE / OUTPUT:  Intake/Output Summary (Last 24 hours) at 10/08/14 1520 Last data filed at 10/08/14 1400  Gross per 24 hour  Intake 3964.21 ml  Output    650 ml  Net 3314.21 ml    PHYSICAL EXAMINATION: General:  Elderly AA male in NAD Neuro:  Alert, follows commands.  HEENT:  jvd  better Cardiovascular:  RRR, no MRG Lungs:  Clear , tolerated fluid well Abdomen:  Soft, non-tender, non-distended Musculoskeletal:  No acute deformity. Right AKA Skin: No rashes, erythema.   LABS:  CBC  Recent Labs Lab 10/06/14 0434 10/07/14 0449 10/08/14 0500  WBC 25.0* 28.1* 17.8*  HGB 11.8* 11.0* 9.2*  HCT 34.3* 32.2* 26.2*  PLT 136* 122* 92*   Coag's  Recent Labs Lab 10/05/14 1503  INR 1.43   BMET  Recent Labs Lab 10/06/14 0434 10/07/14 0449 10/08/14 0500  NA 134* 131* 134*  K 3.9 3.4* 3.6  CL 107 104 109  CO2 18* 21 18*  BUN 47* 30* 21  CREATININE 1.89* 1.30 0.96  GLUCOSE 157* 154* 107*   Electrolytes  Recent Labs Lab 10/06/14 0434 10/07/14 0449 10/07/14 1344 10/08/14 0500  CALCIUM 8.0* 7.7*  --  7.7*  MG 2.1  --  2.0  --   PHOS 3.4  --   --   --    Sepsis Markers  Recent Labs Lab 10/05/14 1518 10/06/14 0130  LATICACIDVEN 2.50* 1.2   ABG  Recent Labs Lab 10/05/14 1622  PHART 7.464*  PCO2ART 23.4*  PO2ART 251.0*   Liver Enzymes  Recent Labs Lab 10/05/14 1503  AST 111*  ALT 30  ALKPHOS 68  BILITOT 0.4  ALBUMIN 2.6*   Cardiac Enzymes  Recent Labs Lab 10/05/14 1503  TROPONINI 0.03   Glucose  Recent Labs Lab 10/07/14 1624 10/07/14 1910 10/07/14 2346 10/08/14 0449 10/08/14 0706 10/08/14 1109  GLUCAP 113* 92 110* 112* 100* 86  Imaging Dg Femur [place] Right  10/07/2014   CLINICAL DATA:  74 year old male under preoperative evaluation.  EXAM: DG OPERATIVE RIGHT FEMUR  TECHNIQUE: Two frontal radiographs of the right femur are submitted.  COMPARISON:  08/24/2014.  FINDINGS: Two AP views of the right femur demonstrate a long intramedullary rod traversing a healed fracture of the femoral diaphysis in the proximal and middle thirds, where there is abundant callus formation. No destructive bony lesion identified. Heterotopic bone formation adjacent to the proximal aspect of the intramedullary rod. 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  IMPRESSION: 1. Intramedullary rod in the right femur with healed fracture of the proximal 2/3 of the femoral diaphysis.   Electronically Signed   By: Vinnie Langton M.D.   On: 10/07/2014 09:47     ASSESSMENT / PLAN:  PULMONARY A: No acute issues P:   IS DNI Comfort if fails  CARDIOVASCULAR CVL 3/2 >>> A:  Septic shock, likely from ischemic lower extremity.  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 Now s/p right AKA  Hypovolemia responding to volume thus far well P:  MAP goal 60 met, dc line, off pressors Dc tele dnr  RENAL A:   AKI, ATN, much improved - resolved Hypovolemia  P:   kvo  GASTROINTESTINAL A:   No acute issues P:   SUP: IV protonix Cleared for diet  HEMATOLOGIC A:   Anemia dilutional Leukocytosis Afib Thrombocytopenia DVT/PE P:  Hold heparin now s/p AKA consumption from thrombus likely If plat wnl or flat in am , add coumadin (recent renal failure and xaralto)  INFECTIOUS A:   Septic shock 2nd to Urine lower extremity ischemia P:   BCx2 3/1 > UC  3/1 > Abx: pip/tazo 3/1>>>3/4 Abx: Vancomycin 3/1>>>3/4 cipro 3/4>>>plan stop 7 days  Dc zosyn Add cipro Dc vanc Dc foley  ENDOCRINE A:   DM Cortisol 40 P:   CBG monitoring and SSI  NEUROLOGIC A:   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 Titus Mould, MD, [place] Pgr: [facility] 10/08/2014 3:20 PM

## 2014-10-08 NOTE — Evaluation (Signed)
Physical Therapy Evaluation Patient Details Name: [name] [name] MRN: [id number] DOB: 1940/09/11 Today's Date: 10/08/2014   History of Present Illness  75 y.o. male s/p Rt AKA, with Hx of AAA.  Clinical Impression  Patient is seen following the above procedure and presents with functional limitations due to the deficits listed below (see PT Problem List). Total assist +2 to bring patient to edge of bed. Tolerated sitting edge of bed x 5.5 minutes and progressed to independent sitting balance with intermittent assist towards end of session. VSS throughout therapy session. Patient will benefit from skilled PT to increase their independence and safety with mobility to allow discharge to the venue listed below.       Follow Up Recommendations SNF    Equipment Recommendations  None recommended by PT    Recommendations for Other Services       Precautions / Restrictions Precautions Precautions: Fall Restrictions Weight Bearing Restrictions: Yes RLE Weight Bearing: Non weight bearing      Mobility  Bed Mobility Overal bed mobility: Needs Assistance;+2 for physical assistance Bed Mobility: Supine to Sit;Rolling;Sit to Supine Rolling: Max assist;+2 for physical assistance   Supine to sit: Total assist;+2 for physical assistance;HOB elevated Sit to supine: Max assist;+2 for physical assistance   General bed mobility comments: Max assist to roll Lt and Rt, with cues for hand placement, able to pull on rail for assist. Total assist with patient resisting posteriorly when attempting to sit edge of bed. Use of pad to scoot forward with +2 assist at trunk at all times.  Transfers                    Ambulation/Gait                Stairs            Wheelchair Mobility    Modified Rankin (Stroke Patients Only)       Balance Overall balance assessment: Needs assistance Sitting-balance support: Bilateral upper extremity supported Sitting balance-Leahy Scale:  Poor Sitting balance - Comments: Patient tolerated sitting edge of bed x 5.5 minutes. Focusing on reaching within limits of stability to Lt and right. Leaning very heavily towards posterior initially, however progressed to sitting edge of bed independently at end of session with intermittent assist for balance. Fearful with anterior weight shift. Postural control: Posterior lean                                   Pertinent Vitals/Pain Pain Assessment: 0-10 Pain Score: 9  Pain Location: Rt residual limb with movement. Pain Descriptors / Indicators: Other (Comment) (Hurts) Pain Intervention(s): Limited activity within patient's tolerance;Monitored during session;Repositioned  HR 64 SpO2 97% on 2L supplemental O2 BP 115/72    Home Living Family/patient expects to be discharged to:: Skilled nursing facility Living Arrangements:  (SNF) Available Help at Discharge: Skilled Nursing Facility Type of Home: Skilled Nursing Facility                Prior Function Level of Independence: Needs assistance   Gait / Transfers Assistance Needed: Squat pivot transfer with assistance.  ADL's / Homemaking Assistance Needed: Needs assist with all ADLs  Comments: Daughter reports patient was able to pivot transfer with assist prior to admission.      Hand Dominance        Extremity/Trunk Assessment   Upper Extremity Assessment: Defer to OT evaluation  Lower Extremity Assessment: RLE deficits/detail;LLE deficits/detail RLE Deficits / Details: decreased strength/ROM at hip, difficult to assess due to pain and guarding. LLE Deficits / Details: Very rigid. Difficult to get patient to extend knee fully in seated position.     Communication   Communication: Expressive difficulties  Cognition Arousal/Alertness: Awake/alert Behavior During Therapy: Flat affect Overall Cognitive Status: Difficult to assess                      General Comments       Exercises General Exercises - Lower Extremity Long Arc Quad: Strengthening;AAROM;Left;10 reps;Seated Hip Flexion/Marching: AAROM;Strengthening;Both;10 reps;Seated      Assessment/Plan    PT Assessment Patient needs continued PT services  PT Diagnosis Difficulty walking;Acute pain;Generalized weakness   PT Problem List Decreased strength;Decreased range of motion;Decreased activity tolerance;Decreased balance;Decreased mobility;Decreased knowledge of use of DME;Pain  PT Treatment Interventions DME instruction;Functional mobility training;Therapeutic activities;Therapeutic exercise;Balance training;Neuromuscular re-education;Patient/family education;Wheelchair mobility training;Modalities   PT Goals (Current goals can be found in the Care Plan section) Acute Rehab PT Goals Patient Stated Goal: none stated PT Goal Formulation: With patient/family Time For Goal Achievement: 10/22/14 Potential to Achieve Goals: Fair    Frequency Min 2X/week   Barriers to discharge Decreased caregiver support Needs trained health care support at d/c    Co-evaluation               End of Session Equipment Utilized During Treatment: Gait belt;Oxygen Activity Tolerance: Patient limited by fatigue;Patient limited by pain Patient left: in bed;with call bell/phone within reach;with family/visitor present Nurse Communication: Mobility status;Other (comment);Need for lift equipment (IV beeping, foul smell in room after pt back to bed.)         Time: 1005-1031 PT Time Calculation (min) (ACUTE ONLY): 26 min   Charges:   PT Evaluation $Initial PT Evaluation Tier I: 1 Procedure PT Treatments $Therapeutic Activity: 8-22 mins   PT G Codes:        Berton MountBarbour, Jeroline Wolbert S 10/08/2014, 11:38 AM Charlsie MerlesLogan Secor Sherlene Rickel, PT [phone]

## 2014-10-08 NOTE — Care Management Note (Signed)
  Page 1 of 1   10/08/2014     11:03:28 AM CARE MANAGEMENT NOTE 10/08/2014  Patient:  [name] [name],[name] [name]   Account Number:  [id number]  Date Initiated:  10/06/2014  Documentation initiated by:  Avie ArenasBROWN,SARAH  Subjective/Objective Assessment:   Admitted from SNF - with mottling of legs - septic. VVS following.     Action/Plan:   Anticipated DC Date:  10/11/2014   Anticipated DC Plan:  SKILLED NURSING FACILITY  In-house referral  Clinical Social Worker      DC Planning Services  CM consult      Choice offered to / List presented to:             Status of service:  In process, will continue to follow Medicare Important Message given?  YES (If response is "NO", the following Medicare IM given date fields will be blank) Date Medicare IM given:  10/08/2014 Medicare IM given by:  Junius CreamerWELL,DEBBIE Date Additional Medicare IM given:   Additional Medicare IM given by:    Discharge Disposition:    Per UR Regulation:  Reviewed for med. necessity/level of care/duration of stay  If discussed at Long Length of Stay Meetings, dates discussed:    Comments:  ContactEustaquio Maize: Gay,Linda Other [phone]    Moore,Vivian Daughter [phone]  [phone]   10-06-14 9:30am Avie ArenasSarah Brown, RNBSN [phone] From SNF - consult placed.

## 2014-10-08 NOTE — Evaluation (Signed)
Occupational Therapy Evaluation Patient Details Name: [name] [name] MRN: [id number] DOB: 16-Jan-1941 Today's Date: 10/08/2014    History of Present Illness 74 y.o. male s/p Rt AKA, with Hx of AAA and CVA   Clinical Impression   Pt with baseline dependence in bathing, dressing, toileting and ADL transfers.  Able to self feed prior to admission and continues.  Pt presents with +2 dependence in bed level mobility. Plan is for pt to return to SNF upon d/c.  Will defer further OT to SNF.    Follow Up Recommendations  SNF;Supervision/Assistance - 24 hour    Equipment Recommendations       Recommendations for Other Services       Precautions / Restrictions Precautions Precautions: Fall Restrictions Weight Bearing Restrictions: No      Mobility Bed Mobility Overal bed mobility: Needs Assistance;+2 for physical assistance Bed Mobility: Rolling Rolling: Max assist;+2 for physical assistance         General bed mobility comments: total assist to pull up in bed  Transfers                      Balance                                            ADL Overall ADL's : At baseline                                       General ADL Comments: Pt can perform UB bathing with assist for back, and pericare with min assist.  Total assist needed for LB ADL.     Vision Additional Comments: eyes do not appear aligned, pt reports vision changes intially after his stroke, but denies any change from baseline currently   Perception     Praxis      Pertinent Vitals/Pain Pain Assessment: Faces Faces Pain Scale: Hurts even more Pain Location: R LE with bed mobility Pain Descriptors / Indicators: Guarding;Grimacing Pain Intervention(s): Premedicated before session;Repositioned     Hand Dominance Left   Extremity/Trunk Assessment Upper Extremity Assessment Upper Extremity Assessment: RUE deficits/detail RUE Deficits / Details: generalized  weakness, functional assist to L UE for B tasks RUE Sensation: decreased proprioception RUE Coordination: decreased fine motor;decreased gross motor   Lower Extremity Assessment Lower Extremity Assessment: Defer to PT evaluation       Communication Communication Communication: Expressive difficulties   Cognition Arousal/Alertness: Awake/alert Behavior During Therapy: WFL for tasks assessed/performed Overall Cognitive Status: Within Functional Limits for tasks assessed (Pt able to offer PLOF.)                     General Comments       Exercises       Shoulder Instructions      Home Living Family/patient expects to be discharged to:: Skilled nursing facility   Available Help at Discharge: Skilled Nursing Facility Type of Home: Skilled Nursing Facility                                  Prior Functioning/Environment Level of Independence: Needs assistance  Gait / Transfers Assistance Needed: Squat pivot transfer with assistance. ADL's / Homemaking Assistance Needed: assisted with bathing,  dressing and toileting, was able to self feed   Comments: Daughter reports patient was able to pivot transfer with assist prior to admission.     OT Diagnosis: Generalized weakness;Acute pain   OT Problem List:     OT Treatment/Interventions:      OT Goals(Current goals can be found in the care plan section) Acute Rehab OT Goals Patient Stated Goal: none stated  OT Frequency:     Barriers to D/C:            Co-evaluation              End of Session    Activity Tolerance:   Patient left: in bed;with call bell/phone within reach;with nursing/sitter in room   Time: 1914-7829 OT Time Calculation (min): 19 min Charges:  OT General Charges $OT Visit: 1 Procedure OT Evaluation $Initial OT Evaluation Tier I: 1 Procedure G-Codes:    Evern Bio 10/08/2014, 3:55 PM  [phone]

## 2014-10-08 NOTE — Progress Notes (Signed)
   VASCULAR SURGERY ASSESSMENT & PLAN:  * 1 Day Post-Op s/p: Right AKA  *  Doing well. Dressing change tomorrow.   SUBJECTIVE: Pain well controlled.   PHYSICAL EXAM: Filed Vitals:   10/08/14 0448 10/08/14 0500 10/08/14 0600 10/08/14 0700  BP:  110/57 116/52 114/60  Pulse:  59 58 63  Temp: 97.4 F (36.3 C)     TempSrc: Oral     Resp:  19 20 19   Weight:      SpO2:  99% 98% 97%   Dressing on right AKA is dry.   LABS: Lab Results  Component Value Date   WBC 17.8* 10/08/2014   HGB 9.2* 10/08/2014   HCT 26.2* 10/08/2014   MCV 89.4 10/08/2014   PLT PENDING 10/08/2014   Lab Results  Component Value Date   CREATININE 0.96 10/08/2014   Lab Results  Component Value Date   INR 1.43 10/05/2014   CBG (last 3)   Recent Labs  10/07/14 1910 10/07/14 2346 10/08/14 0449  GLUCAP 92 110* 112*    Active Problems:   Sepsis   AAA (abdominal aortic aneurysm)   Abdominal pain   PAD (peripheral artery disease)   Altered mental status   Encounter for central line placement    Cari CarawayChris Dickson Beeper: [phone] 10/08/2014

## 2014-10-08 NOTE — Progress Notes (Signed)
eLink Physician-Brief Progress Note Patient Name: [name] SawyersDavid T [name] DOB: [birth year] MRN: [id number]   Date of Service  10/08/2014  HPI/Events of Note  Called to D/C Norepinephrine, order AC and HS POC CBG and D/C D5 0.45 NaCl at 10 mL/lhour.  eICU Interventions  Will order all of above.      Intervention Category Minor Interventions: Routine modifications to care plan (e.g. PRN medications for pain, fever)  [name] [name] 10/08/2014, 6:22 PM

## 2014-10-08 NOTE — Progress Notes (Signed)
Admission skin assessment - Patient transferred to 6E from 44M.   Deep tissue injuries to right hip, right thigh, right buttocks, and penis.   According to the [facility]OC nurse's note, patient had several fluid filled blisters on his sacrum and right buttock. These blisters appeared to have ruptured. Could possibly be stage 2 pressure wounds. [facility].   Patient's scrotum and penis are both edematous.   Skin is otherwise intact.  Leanna BattlesEckelmann, Vince Ainsley Eileen, RN.

## 2014-10-09 DIAGNOSIS — N390 Urinary tract infection, site not specified: Secondary | ICD-10-CM | POA: Diagnosis present

## 2014-10-09 DIAGNOSIS — Z8673 Personal history of transient ischemic attack (TIA), and cerebral infarction without residual deficits: Secondary | ICD-10-CM

## 2014-10-09 DIAGNOSIS — R6521 Severe sepsis with septic shock: Secondary | ICD-10-CM

## 2014-10-09 DIAGNOSIS — Z86711 Personal history of pulmonary embolism: Secondary | ICD-10-CM

## 2014-10-09 DIAGNOSIS — D649 Anemia, unspecified: Secondary | ICD-10-CM | POA: Diagnosis present

## 2014-10-09 LAB — CBC
HEMATOCRIT: 28.1 % — AB (ref 39.0–52.0)
Hemoglobin: 9.8 g/dL — ABNORMAL LOW (ref 13.0–17.0)
MCH: 31.1 pg (ref 26.0–34.0)
MCHC: 34.9 g/dL (ref 30.0–36.0)
MCV: 89.2 fL (ref 78.0–100.0)
Platelets: 98 10*3/uL — ABNORMAL LOW (ref 150–400)
RBC: 3.15 MIL/uL — AB (ref 4.22–5.81)
RDW: 13.1 % (ref 11.5–15.5)
WBC: 14.3 10*3/uL — ABNORMAL HIGH (ref 4.0–10.5)

## 2014-10-09 LAB — PROTIME-INR
INR: 1.21 (ref 0.00–1.49)
Prothrombin Time: 15.4 seconds — ABNORMAL HIGH (ref 11.6–15.2)

## 2014-10-09 LAB — GLUCOSE, CAPILLARY
GLUCOSE-CAPILLARY: 79 mg/dL (ref 70–99)
Glucose-Capillary: 117 mg/dL — ABNORMAL HIGH (ref 70–99)
Glucose-Capillary: 87 mg/dL (ref 70–99)
Glucose-Capillary: 125 mg/dL — ABNORMAL HIGH (ref 70–99)

## 2014-10-09 MED ORDER — COUMADIN BOOK
Freq: Once | Status: DC
  Filled 2014-10-09: qty 1

## 2014-10-09 MED ORDER — WARFARIN VIDEO: Freq: Once | Status: DC

## 2014-10-09 MED ORDER — WARFARIN SODIUM 4 MG PO TABS
4.0000 mg | ORAL_TABLET | Freq: Once | ORAL | Status: AC
  Administered 2014-10-09: 4 mg via ORAL
  Filled 2014-10-09 (×2): qty 1

## 2014-10-09 MED ORDER — WARFARIN - PHARMACIST DOSING INPATIENT: Freq: Every day | Status: DC

## 2014-10-09 NOTE — Progress Notes (Addendum)
[facility] PROGRESS NOTE  [name] [name] ION:[id number] DOB: [birth year] DOA: 10/05/2014 PCP: Syliva Overman, MD  Summary 74 year old male from SNF, h/o DVT/PE on anticoagulation, CVA, DM, and MI, presented to [facility] ED 10/05/2014 for lethargy. At baseline he does not walk, but is able to push himself around in a wheelchair.  Found to have UTI, septic shock, mottling in BLE. He was transferred to [facility] for ICU admission and vascular surgery eval. Initially on pressors and not stable to go to OR for limb salvage.  After several days empiric antibiotics, stress dose steroids, pressors, fluid rescusitation, patient stabilized enough to go to OR for AKA.  Pressors weaned, steroids stopped and changed to oral antibiotics, transferred to [facility] 10/09/14.  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 Stump looks good. Will go back to SNF, rather than CIR per PM&R note   Toxic encephalopathy resolved? Seems at baseline   UTI (urinary tract infection): enterobacter sensitive to cipro   Anemia: monitor   History of pulmonary embolism: pharmacy resuming warfarin   History of stroke HTN: meds held HLD on statin  Code Status:  meds only Family Communication:   Disposition Plan:  SNF when ok with vascular  Consultants:  PCCM  VVS  Procedures:   Right AKA  Left IJ CVL  Antibiotics: pip/tazo 3/1>>>3/4 Abx: Vancomycin 3/1>>>3/4 cipro 3/4>>>plan stop 7 days  HPI/Subjective: No complaints  Objective: Filed Vitals:   10/09/14 0923  BP: 120/56  Pulse: 72  Temp: 97.8 F (36.6 C)  Resp: 18    Intake/Output Summary (Last 24 hours) at 10/09/14 1524 Last data filed at 10/09/14 1415  Gross per 24 hour  Intake 652.17 ml  Output    477 ml  Net 175.17 ml   Filed Weights   10/05/14 1436 10/05/14 1633 10/08/14 2018  Weight: 87.544 kg (193 lb) 87.544 kg (193 lb) 80.2 kg (176 lb 12.9 oz)    Exam:   General:  Alert. Watching TV. Somewhat difficult to understand. Follows commands and answers questions.  HEENT: Right facial droop. No thrush.  Cardiovascular: Regular rate rhythm without murmurs gallops rubs  Respiratory: CTA without WRR  Abdomen: S, NT, ND  Ext: right AKA stump without drainage, erythema. Left leg without edema  Basic Metabolic Panel:  Recent Labs Lab 10/05/14 1503 10/05/14 1516 10/06/14 0434 10/07/14 0449 10/07/14 1344 10/08/14 0500  NA 131* 134* 134* 131*  --  134*  K 5.1 5.1 3.9 3.4*  --  3.6  CL 100 102 107 104  --  109  CO2 20  --  18* 21  --  18*  GLUCOSE 141* 130* 157* 154*  --  107*  BUN 62* 55* 47* 30*  --  21  CREATININE 2.77* 2.70* 1.89* 1.30  --  0.96  CALCIUM 8.2*  --  8.0* 7.7*  --  7.7*  MG  --   --  2.1  --  2.0  --   PHOS  --   --  3.4  --   --   --    Liver Function Tests:  Recent Labs Lab 10/05/14 1503  AST 111*  ALT 30  ALKPHOS 68  BILITOT 0.4  PROT 6.4  ALBUMIN 2.6*   No results for input(s): LIPASE, AMYLASE in the last 168 hours.  No results for input(s): AMMONIA in the last 168 hours. CBC:  Recent Labs Lab 10/05/14 1503 10/05/14 1516 10/06/14 0434 10/07/14 0449 10/08/14 0500 10/09/14 0539  WBC 25.4*  --  25.0* 28.1* 17.8* 14.3*  NEUTROABS 22.5*  --   --   --   --   --   HGB 11.6* 11.6* 11.8* 11.0* 9.2* 9.8*  HCT 43.5 34.0* 34.3* 32.2* 26.2* 28.1*  MCV 120.2*  --  91.0 91.0 89.4 89.2  PLT 174  --  136* 122* 92* 98*   Cardiac Enzymes:  Recent Labs Lab 10/05/14 1503  TROPONINI 0.03   BNP (last 3 results)  Recent Labs  10/05/14 1503  BNP 165.0*    ProBNP (last 3 results) No results for input(s): PROBNP in the last 8760 hours.  CBG:  Recent Labs Lab 10/08/14 1109 10/08/14 1715 10/08/14 2014 10/09/14 0754 10/09/14 1158  GLUCAP 86 90 90 79 117*    Recent  Results (from the past 240 hour(s))  Urine culture     Status: None   Collection Time: 10/05/14  4:15 PM  Result Value Ref Range Status   Specimen Description URINE, CATHETERIZED  Final   Special Requests NONE  Final   Colony Count   Final    >=100,000 COLONIES/ML Performed at [facility]    Culture   Final    ENTEROBACTER AEROGENES Performed at [facility]    Report Status 10/07/2014 FINAL  Final   Organism ID, Bacteria ENTEROBACTER AEROGENES  Final      Susceptibility   Enterobacter aerogenes - MIC*    CEFAZOLIN >=64 RESISTANT Resistant     CEFTRIAXONE 8 SENSITIVE Sensitive     CIPROFLOXACIN <=0.25 SENSITIVE Sensitive     GENTAMICIN <=1 SENSITIVE Sensitive     LEVOFLOXACIN <=0.12 SENSITIVE Sensitive     NITROFURANTOIN 64 INTERMEDIATE Intermediate     TOBRAMYCIN <=1 SENSITIVE Sensitive     TRIMETH/SULFA <=20 SENSITIVE Sensitive     PIP/TAZO >=128 RESISTANT Resistant     * ENTEROBACTER AEROGENES  Blood Culture (routine x 2)     Status: None (Preliminary result)   Collection Time: 10/05/14  4:45 PM  Result Value Ref Range Status   Specimen Description BLOOD RIGHT ARM  Final   Special Requests BOTTLES DRAWN AEROBIC AND ANAEROBIC 8CC  Final   Culture NO GROWTH 4 DAYS  Final   Report Status PENDING  Incomplete  Blood Culture (routine x 2)     Status: None (Preliminary result)   Collection Time: 10/05/14  4:50 PM  Result Value Ref Range Status   Specimen Description BLOOD RIGHT HAND  Final   Special Requests BOTTLES DRAWN AEROBIC AND ANAEROBIC 8CC  Final   Culture NO GROWTH 4 DAYS  Final   Report Status PENDING  Incomplete  MRSA PCR Screening     Status: None   Collection Time: 10/05/14  8:55 PM  Result Value Ref Range Status   MRSA by PCR NEGATIVE NEGATIVE Final    Comment:        The GeneXpert MRSA Assay (FDA approved for NASAL specimens only), is one component of a comprehensive MRSA colonization surveillance program. It is not intended to diagnose  MRSA infection nor to guide or monitor treatment for MRSA infections.   Clostridium Difficile by PCR     Status: None   Collection Time: 10/06/14  6:09 AM  Result Value Ref Range Status   C difficile by pcr NEGATIVE NEGATIVE Final  Studies: No results found.  Scheduled Meds: . antiseptic oral rinse  7 mL Mouth Rinse q12n4p  . chlorhexidine  15 mL Mouth Rinse BID  . ciprofloxacin  500 mg Oral BID  . citalopram  40 mg Oral Daily  . coumadin book   Does not apply Once  . docusate sodium  200 mg Oral BID  . heparin subcutaneous  5,000 Units Subcutaneous 3 times per day  . pravastatin  80 mg Oral QPM  . warfarin  4 mg Oral ONCE-1800  . warfarin   Does not apply Once  . Warfarin - Pharmacist Dosing Inpatient   Does not apply q1800   Continuous Infusions: . sodium chloride 10 mL/hr at 10/09/14 1431    Time spent: 25 minutes  Jennipher Weatherholtz L  [facility] night-coverage at [email or web address], password [email or web address] 10/09/2014, 3:24 PM  LOS: 4 days

## 2014-10-09 NOTE — Progress Notes (Signed)
Skin on left buttock peeled away in area of deep tissue injury causing the patient pain and bleeding. Allevyn applied to bottom to support patient. Gildardo CrankerAnita Priddy, RN

## 2014-10-09 NOTE — Progress Notes (Signed)
    Subjective  - POD #2  Not complaining of a significant amount of pain. Resting comfortably   Physical Exam:  Dressing was removed from his right above-knee amputation.  The wound appears to be healing nicely and is intact       Assessment/Plan:  POD #2  Continue to monitor the stump to make sure that it heals.  [name] [name] IV, [name] [name] 10/09/2014 10:53 AM --  Filed Vitals:   10/09/14 0923  BP: 120/56  Pulse: 72  Temp: 97.8 F (36.6 C)  Resp: 18    Intake/Output Summary (Last 24 hours) at 10/09/14 1053 Last data filed at 10/09/14 0906  Gross per 24 hour  Intake 1157.17 ml  Output    575 ml  Net 582.17 ml     Laboratory CBC    Component Value Date/Time   WBC 14.3* 10/09/2014 0539   HGB 9.8* 10/09/2014 0539   HCT 28.1* 10/09/2014 0539   PLT 98* 10/09/2014 0539    BMET    Component Value Date/Time   NA 134* 10/08/2014 0500   K 3.6 10/08/2014 0500   CL 109 10/08/2014 0500   CO2 18* 10/08/2014 0500   GLUCOSE 107* 10/08/2014 0500   BUN 21 10/08/2014 0500   CREATININE 0.96 10/08/2014 0500   CREATININE 1.23 12/07/2013 0812   CALCIUM 7.7* 10/08/2014 0500   GFRNONAA 80* 10/08/2014 0500   GFRNONAA 58* 12/07/2013 0812   GFRAA >90 10/08/2014 0500   GFRAA 67 12/07/2013 0812    COAG Lab Results  Component Value Date   INR 1.21 10/09/2014   INR 1.43 10/05/2014   INR 1.46 06/03/2013   No results found for: PTT  Antibiotics Anti-infectives    Start     Dose/Rate Route Frequency Ordered Stop   10/08/14 1600  ciprofloxacin (CIPRO) tablet 500 mg     500 mg Oral 2 times daily 10/08/14 1526 10/15/14 1959   10/07/14 0830  vancomycin (VANCOCIN) IVPB 750 mg/150 ml premix  Status:  Discontinued     750 mg 150 mL/hr over 60 Minutes Intravenous Every 12 hours 10/07/14 0744 10/08/14 1527   10/05/14 1800  piperacillin-tazobactam (ZOSYN) IVPB 3.375 g  Status:  Discontinued     3.375 g 12.5 mL/hr over 240 Minutes Intravenous Every 8 hours 10/05/14 1640 10/08/14  1527   10/05/14 1700  vancomycin (VANCOCIN) IVPB 1000 mg/200 mL premix  Status:  Discontinued     1,000 mg 200 mL/hr over 60 Minutes Intravenous Every 24 hours 10/05/14 1639 10/07/14 0743   10/05/14 1630  piperacillin-tazobactam (ZOSYN) IVPB 3.375 g  Status:  Discontinued     3.375 g 100 mL/hr over 30 Minutes Intravenous  Once 10/05/14 1629 10/05/14 1640   10/05/14 1630  vancomycin (VANCOCIN) IVPB 1000 mg/200 mL premix  Status:  Discontinued     1,000 mg 200 mL/hr over 60 Minutes Intravenous  Once 10/05/14 1629 10/05/14 1639       [name] [name] IV, M.D. [facility] [place] Office: [phone] Pager:  [phone]
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
## 2014-10-10 DIAGNOSIS — G92 Toxic encephalopathy: Secondary | ICD-10-CM

## 2014-10-10 DIAGNOSIS — Z7901 Long term (current) use of anticoagulants: Secondary | ICD-10-CM

## 2014-10-10 LAB — GLUCOSE, CAPILLARY
GLUCOSE-CAPILLARY: 115 mg/dL — AB (ref 70–99)
Glucose-Capillary: 97 mg/dL (ref 70–99)
Glucose-Capillary: 132 mg/dL — ABNORMAL HIGH (ref 70–99)
Glucose-Capillary: 91 mg/dL (ref 70–99)

## 2014-10-10 LAB — CULTURE, BLOOD (ROUTINE X 2)
CULTURE: NO GROWTH
Culture: NO GROWTH

## 2014-10-10 LAB — BASIC METABOLIC PANEL
ANION GAP: 3 — AB (ref 5–15)
BUN: 12 mg/dL (ref 6–23)
CHLORIDE: 113 mmol/L — AB (ref 96–112)
CO2: 20 mmol/L (ref 19–32)
Calcium: 7.7 mg/dL — ABNORMAL LOW (ref 8.4–10.5)
Creatinine, Ser: 0.71 mg/dL (ref 0.50–1.35)
Glucose, Bld: 90 mg/dL (ref 70–99)
Potassium: 3.3 mmol/L — ABNORMAL LOW (ref 3.5–5.1)
Sodium: 136 mmol/L (ref 135–145)

## 2014-10-10 LAB — PROTIME-INR
INR: 1.27 (ref 0.00–1.49)
PROTHROMBIN TIME: 16 s — AB (ref 11.6–15.2)

## 2014-10-10 MED ORDER — POTASSIUM CHLORIDE CRYS ER 20 MEQ PO TBCR
40.0000 meq | EXTENDED_RELEASE_TABLET | Freq: Once | ORAL | Status: AC
  Administered 2014-10-10: 40 meq via ORAL
  Filled 2014-10-10: qty 2

## 2014-10-10 MED ORDER — WARFARIN SODIUM 6 MG PO TABS
6.0000 mg | ORAL_TABLET | Freq: Once | ORAL | Status: AC
  Administered 2014-10-10: 6 mg via ORAL
  Filled 2014-10-10: qty 1

## 2014-10-10 NOTE — Progress Notes (Signed)
[facility] PROGRESS NOTE  [name] SawyersDavid T [name] UEA:[id number] DOB: [birth year] DOA: 10/05/2014 PCP: [name] OvermanMargaret Simpson, MD  Summary 74 year old male from SNF, h/o DVT/PE on anticoagulation, CVA, DM, and MI, presented to [facility] 10/05/2014 for lethargy. At baseline he does not walk, but is able to push himself around in a wheelchair.  Found to have UTI, septic shock, mottling in BLE. [facility] and vascular surgery eval. Initially on pressors and not stable to go to OR for limb salvage.  After several days empiric antibiotics, stress dose steroids, pressors, fluid rescusitation, patient stabilized enough to to to OR for AKA.  Pressors weaned, steroids stopped and changed to oral antibiotics, [facility]  Assessment/Plan:    Septic shock secondary to UTI, ischemic limb    Chronic anticoagulation: resume warfarin   Thrombocytopenia stabilizing   AAA (abdominal aortic aneurysm)   Acute on chronic ischemic right LE, s/p AKA. Stump looks good. Will go back to SNF, rather than CIR per PM&R note   Toxic encephalopathy resolved? Seems at baseline   UTI (urinary tract infection): enterobacter sensitive to cipro   Anemia: monitor   History of pulmonary embolism: pharmacy resuming warfarin   History of stroke HTN: meds held HLD on statin  Code Status:  meds only Family Communication:   Disposition Plan:  SNF when ok with vascular  Consultants:  PCCM  VVS  Procedures:   Right AKA  Left IJ CVL  Antibiotics: pip/tazo 3/1>>>3/4 Abx: Vancomycin 3/1>>>3/4 cipro 3/4>>>plan stop 7 days  HPI/Subjective: No complaints  Objective: Filed Vitals:   10/10/14 0439  BP: 146/74  Pulse: 71  Temp: 98.7 F (37.1 C)  Resp: 16    Intake/Output Summary (Last 24 hours) at 10/10/14 0839 Last data filed at 10/10/14 0644  Gross per 24 hour  Intake 1580.33 ml  Output    402 ml  Net 1178.33 ml   Filed Weights   10/05/14 1633 10/08/14 2018 10/09/14  2141  Weight: 87.544 kg (193 lb) 80.2 kg (176 lb 12.9 oz) 79.6 kg (175 lb 7.8 oz)    Exam:   General:  Alert. - speech difficult to understand. Follows commands and answers questions.  HEENT: Right facial droop. No thrush.  Cardiovascular: Regular rate rhythm without murmurs gallops rubs  Respiratory: CTA without WRR  Abdomen: S, NT, ND  Ext: right AKA stump without drainage, erythema. Left leg without edema  Basic Metabolic Panel:  Recent Labs Lab 10/05/14 1503 10/05/14 1516 10/06/14 0434 10/07/14 0449 10/07/14 1344 10/08/14 0500 10/10/14 0535  NA 131* 134* 134* 131*  --  134* 136  K 5.1 5.1 3.9 3.4*  --  3.6 3.3*  CL 100 102 107 104  --  109 113*  CO2 20  --  18* 21  --  18* 20  GLUCOSE 141* 130* 157* 154*  --  107* 90  BUN 62* 55* 47* 30*  --  21 12  CREATININE 2.77* 2.70* 1.89* 1.30  --  0.96 0.71  CALCIUM 8.2*  --  8.0* 7.7*  --  7.7* 7.7*  MG  --   --  2.1  --  2.0  --   --   PHOS  --   --  3.4  --   --   --   --    Liver Function Tests:  Recent Labs Lab 10/05/14 1503  AST 111*  ALT 30  ALKPHOS 68  BILITOT 0.4  PROT 6.4  ALBUMIN 2.6*   No results for input(s): LIPASE, AMYLASE in the last 168 hours. No results for input(s): AMMONIA in the last 168 hours. CBC:  Recent Labs Lab 10/05/14 1503 10/05/14 1516 10/06/14 0434 10/07/14 0449 10/08/14 0500 10/09/14 0539  WBC 25.4*  --  25.0* 28.1* 17.8* 14.3*  NEUTROABS 22.5*  --   --   --   --   --   HGB 11.6* 11.6* 11.8* 11.0* 9.2* 9.8*  HCT 43.5 34.0* 34.3* 32.2* 26.2* 28.1*  MCV 120.2*  --  91.0 91.0 89.4 89.2  PLT 174  --  136* 122* 92* 98*   Cardiac Enzymes:  Recent Labs Lab 10/05/14 1503  TROPONINI 0.03   BNP (last 3 results)  Recent Labs  10/05/14 1503  BNP 165.0*    ProBNP (last 3 results) No results for input(s): PROBNP in the last 8760 hours.  CBG:  Recent Labs Lab 10/09/14 0754 10/09/14 1158 10/09/14 1646 10/09/14 2137 10/10/14 0751  GLUCAP 79 117* 87 125* 97     Recent Results (from the past 240 hour(s))  Urine culture     Status: None   Collection Time: 10/05/14  4:15 PM  Result Value Ref Range Status   Specimen Description URINE, CATHETERIZED  Final   Special Requests NONE  Final   Colony Count   Final    >=100,000 COLONIES/ML Performed at [facility]    Culture   Final    ENTEROBACTER AEROGENES Performed at [facility]    Report Status 10/07/2014 FINAL  Final   Organism ID, Bacteria ENTEROBACTER AEROGENES  Final      Susceptibility   Enterobacter aerogenes - MIC*    CEFAZOLIN >=64 RESISTANT Resistant     CEFTRIAXONE 8 SENSITIVE Sensitive     CIPROFLOXACIN <=0.25 SENSITIVE Sensitive     GENTAMICIN <=1 SENSITIVE Sensitive     LEVOFLOXACIN <=0.12 SENSITIVE Sensitive     NITROFURANTOIN 64 INTERMEDIATE Intermediate     TOBRAMYCIN <=1 SENSITIVE Sensitive     TRIMETH/SULFA <=20 SENSITIVE Sensitive     PIP/TAZO >=128 RESISTANT Resistant     * ENTEROBACTER AEROGENES  Blood Culture (routine x 2)     Status: None   Collection Time: 10/05/14  4:45 PM  Result Value Ref Range Status   Specimen Description BLOOD RIGHT ARM  Final   Special Requests BOTTLES DRAWN AEROBIC AND ANAEROBIC 8CC  Final   Culture NO GROWTH 5 DAYS  Final   Report Status 10/10/2014 FINAL  Final  Blood Culture (routine x 2)     Status: None   Collection Time: 10/05/14  4:50 PM  Result Value Ref Range Status   Specimen Description BLOOD RIGHT HAND  Final   Special Requests BOTTLES DRAWN AEROBIC AND ANAEROBIC 8CC  Final   Culture NO GROWTH 5 DAYS  Final   Report Status 10/10/2014 FINAL  Final  MRSA PCR Screening     Status: None   Collection Time: 10/05/14  8:55 PM  Result Value Ref Range Status   MRSA by PCR NEGATIVE NEGATIVE Final    Comment:        The GeneXpert MRSA Assay (FDA approved for NASAL specimens only), is one component of a comprehensive MRSA colonization surveillance program. It is not intended to diagnose MRSA infection nor  to guide or monitor treatment for MRSA infections.   Clostridium Difficile by PCR     Status: None   Collection Time: 10/06/14  6:09 AM  Result Value Ref Range Status  C difficile by pcr NEGATIVE NEGATIVE Final     Studies: No results found.  Scheduled Meds: . antiseptic oral rinse  7 mL Mouth Rinse q12n4p  . chlorhexidine  15 mL Mouth Rinse BID  . ciprofloxacin  500 mg Oral BID  . citalopram  40 mg Oral Daily  . coumadin book   Does not apply Once  . docusate sodium  200 mg Oral BID  . heparin subcutaneous  5,000 Units Subcutaneous 3 times per day  . pravastatin  80 mg Oral QPM  . warfarin   Does not apply Once  . Warfarin - Pharmacist Dosing Inpatient   Does not apply q1800   Continuous Infusions: . sodium chloride 10 mL/hr at 10/09/14 1431    Time spent: 25 minutes  VANN, JESSICA  [facility] night-coverage at [email or web address], password [facility] 10/10/2014, 8:39 AM  LOS: 5 days

## 2014-10-10 NOTE — Progress Notes (Signed)
ANTICOAGULATION CONSULT NOTE - Follow Up Consult  Pharmacy Consult for warfarin Indication: VTE Treatment  No Known Allergies  Patient Measurements: Weight: 175 lb 7.8 oz (79.6 kg)  Vital Signs: Temp: 98.7 F (37.1 C) (03/06 0439) Temp Source: Oral (03/06 0439) BP: 146/74 mmHg (03/06 0439) Pulse Rate: 71 (03/06 0439)  Labs:  Recent Labs  10/08/14 0500 10/09/14 0539 10/10/14 0535  HGB 9.2* 9.8*  --   HCT 26.2* 28.1*  --   PLT 92* 98*  --   LABPROT  --  15.4* 16.0*  INR  --  1.21 1.27  HEPARINUNFRC <0.10*  --   --   CREATININE 0.96  --  0.71    Estimated Creatinine Clearance: 82.2 mL/min (by C-G formula based on Cr of 0.71).   Medications:  Scheduled:  . antiseptic oral rinse  7 mL Mouth Rinse q12n4p  . chlorhexidine  15 mL Mouth Rinse BID  . ciprofloxacin  500 mg Oral BID  . citalopram  40 mg Oral Daily  . coumadin book   Does not apply Once  . docusate sodium  200 mg Oral BID  . heparin subcutaneous  5,000 Units Subcutaneous 3 times per day  . potassium chloride  40 mEq Oral Once  . pravastatin  80 mg Oral QPM  . warfarin   Does not apply Once  . Warfarin - Pharmacist Dosing Inpatient   Does not apply q1800    Assessment: 74 yo m admitted on 3/2 from SNF for LE discoloration, unable to assess BLE pulses. Patient is on warfarin for VTE treatment. INR this AM is subtherapeutic at 1.27.  Hgb 9.8, platelets low at 98 but stable. Patient is also on cipro for UTI.  Will monitor warfarin and adjust doses accordingly.   Goal of Therapy:  INR 2-3 Monitor platelets by anticoagulation protocol: Yes   Plan:  Warfarin 6 mg x 1 tonight F/u INR in AM Monitor CBC, s/s of bleeding, clinical course  Cassie L. Roseanne RenoStewart, PharmD Clinical Pharmacy Resident Pager: [phone] 10/10/2014 9:50 AM

## 2014-10-10 NOTE — Clinical Social Work Psychosocial (Signed)
Clinical Social Work Department BRIEF PSYCHOSOCIAL ASSESSMENT 10/10/2014    Patient:  [name] [name], [name] [name]     Account Number:  [id number]     Admit date:  10/05/2014  Clinical Social Worker:  Hubert Azure  Date/Time:  10/10/2014 02:46 PM  Referred by:  Physician  Date Referred:  10/10/2014 Referred for  SNF Placement   Other Referral:   Interview type:  Family Other interview type:   Patient's granddaughter Maree Erie was present in the room.    PSYCHOSOCIAL DATA Living Status:  FACILITY Admitted from facility:  [facility] Level of care:  [place] Primary support name:  Hubbard Hartshorn ([phone]) Primary support relationship to patient:  CHILD, ADULT Degree of support available:   Good    CURRENT CONCERNS Current Concerns  Post-Acute Placement   Other Concerns:    SOCIAL WORK ASSESSMENT / PLAN CSW met with patient's granddaughter who was present at bedside. CSW introduced self and explained role. CSW discussed d/c plan with granddaughter . Per granddaughter , patient was resident at [facility] in [place] and for approximately one month. Granddaughter states family does not want patient to return to [facility] and prefers [facility]. Niece states if [facility] is not available, family prefers [facility].   Assessment/plan status:  Other - See comment Other assessment/ plan:   CSW to update FL2 for placement.   Information/referral to community resources:    PATIENT'S/FAMILY'S RESPONSE TO PLAN OF CARE: Patient's granddaughter and family is agreeable to SNF placement, but does not want patient to return to [facility] and prefers [facility] or [facility].   [place], [place] Weekend Clinical Social Worker [phone]

## 2014-10-11 DIAGNOSIS — I998 Other disorder of circulatory system: Secondary | ICD-10-CM

## 2014-10-11 LAB — BASIC METABOLIC PANEL
Anion gap: 3 — ABNORMAL LOW (ref 5–15)
BUN: 11 mg/dL (ref 6–23)
CALCIUM: 7.9 mg/dL — AB (ref 8.4–10.5)
CO2: 19 mmol/L (ref 19–32)
CREATININE: 0.68 mg/dL (ref 0.50–1.35)
Chloride: 116 mmol/L — ABNORMAL HIGH (ref 96–112)
GFR calc Af Amer: >90 mL/min (ref >90)
GFR calc non Af Amer: >90 mL/min (ref >90)
Glucose, Bld: 84 mg/dL (ref 70–99)
Potassium: 3.8 mmol/L (ref 3.5–5.1)
Sodium: 138 mmol/L (ref 135–145)

## 2014-10-11 LAB — CBC
HEMATOCRIT: 29.6 % — AB (ref 39.0–52.0)
Hemoglobin: 10.3 g/dL — ABNORMAL LOW (ref 13.0–17.0)
MCH: 31.2 pg (ref 26.0–34.0)
MCHC: 34.8 g/dL (ref 30.0–36.0)
MCV: 89.7 fL (ref 78.0–100.0)
PLATELETS: 138 10*3/uL — AB (ref 150–400)
RBC: 3.3 MIL/uL — AB (ref 4.22–5.81)
RDW: 13.3 % (ref 11.5–15.5)
WBC: 9.3 10*3/uL (ref 4.0–10.5)

## 2014-10-11 LAB — PROTIME-INR
INR: 1.89 — AB (ref 0.00–1.49)
Prothrombin Time: 21.9 seconds — ABNORMAL HIGH (ref 11.6–15.2)

## 2014-10-11 LAB — GLUCOSE, CAPILLARY
Glucose-Capillary: 90 mg/dL (ref 70–99)
Glucose-Capillary: 124 mg/dL — ABNORMAL HIGH (ref 70–99)

## 2014-10-11 MED ORDER — HYDROCODONE-ACETAMINOPHEN 5-325 MG PO TABS: 1.0000 | ORAL_TABLET | ORAL | Status: DC | PRN

## 2014-10-11 MED ORDER — WARFARIN SODIUM 2 MG PO TABS
2.0000 mg | ORAL_TABLET | Freq: Once | ORAL | Status: DC
  Filled 2014-10-11: qty 1

## 2014-10-11 MED ORDER — STARCH (THICKENING) PO POWD: ORAL | Status: DC

## 2014-10-11 MED ORDER — CIPROFLOXACIN HCL 500 MG PO TABS: 500.0000 mg | ORAL_TABLET | Freq: Two times a day (BID) | ORAL | Status: DC

## 2014-10-11 MED ORDER — ACETAMINOPHEN 325 MG PO TABS: 325.0000 mg | ORAL_TABLET | ORAL | Status: DC | PRN

## 2014-10-11 NOTE — Discharge Summary (Addendum)
Physician Discharge Summary  [name] [name] ZOX:[id number] DOB: [birth year] DOA: 10/05/2014  PCP: Syliva Overman, MD  Admit date: 10/05/2014 Discharge date: 10/11/2014  Time spent: 35 minutes  Recommendations for Outpatient Follow-up:  1. Monitor PT/INR 2. cipro until 3/12 3. Resume BP meds as needed 4. ACLS med ok but NO CPR And  NO intubation  Discharge Diagnoses:  Principal Problem:   Septic shock Active Problems:   Chronic anticoagulation   Thrombocytopenia   AAA (abdominal aortic aneurysm)   PAD (peripheral artery disease)   Toxic encephalopathy   Gangrene   UTI (urinary tract infection)   Anemia   History of pulmonary embolism   History of stroke   Discharge Condition: improved  Diet recommendation: DYS 1 - honey thick  Filed Weights   10/08/14 2018 10/09/14 2141 10/10/14 2055  Weight: 80.2 kg (176 lb 12.9 oz) 79.6 kg (175 lb 7.8 oz) 80.3 kg (177 lb 0.5 oz)    History of present illness:  74 year old male from SNF, h/o DVT/PE on anticoagulation, CVA, DM, and MI, presented to [facility] 10/05/2014 for lethargy. At baseline he does not walk, but is able to push himself around in a wheelchair. Found to have UTI, septic shock, mottling in BLE. He was transferred to [facility] for ICU admission and vascular surgery eval. Initially on pressors and not stable to go to OR for limb salvage. After several days empiric antibiotics, stress dose steroids, pressors, fluid rescusitation, patient stabilized enough to to to OR for AKA. Pressors weaned, steroids stopped and changed to oral antibiotics, transferred to [facility] 10/09/14.   Hospital Course:  Septic shock secondary to UTI, ischemic limb -resolved  Chronic anticoagulation: resume warfarin  Thrombocytopenia stabilizing  AAA (abdominal aortic aneurysm)  Acute on chronic ischemic right LE, s/p AKA. Stump looks good. SNF  Toxic encephalopathy resolved? Seems at baseline  UTI (urinary tract infection): enterobacter sensitive to cipro-  through 3/12  Anemia: monitor  History of pulmonary embolism: warfarin  History of stroke HTN: meds held HLD on statin  Procedures:  Right AKA  Consultations:  vascular  Discharge Exam: Filed Vitals:   10/11/14 0824  BP: 133/63  Pulse: 69  Temp: 99.3 F (37.4 C)  Resp: 18    General: awake, hungry Cardiovascular: rrr Respiratory: clear  Discharge Instructions   Discharge Instructions    Discharge instructions    Complete by:  As directed   PT/INR for coumadin management goal 2-3 DYS 1 diet Cbc, bmp 1 week     Increase activity slowly    Complete by:  As directed           Current Discharge Medication List    START taking these medications   Details  ciprofloxacin (CIPRO) 500 MG tablet Take 1 tablet (500 mg total) by mouth 2 (two) times daily.      CONTINUE these medications which have CHANGED   Details  acetaminophen (TYLENOL) 325 MG tablet Take 1-2 tablets (325-650 mg total) by mouth every 4 (four) hours as needed for mild pain (or temp >/= 101 F).    food thickener (THICK IT) POWD As needed Refills: 0    HYDROcodone-acetaminophen (NORCO/VICODIN) 5-325 MG per tablet Take 1 tablet by mouth every 4 (four) hours as needed for moderate pain or severe pain. Qty: 15 tablet, Refills: 0      CONTINUE these medications which have NOT CHANGED   Details  B Complex Vitamins (B COMPLEX-B12 PO) Take 1,000 mcg by mouth daily.  citalopram (CELEXA) 40 MG tablet Take 40 mg by mouth daily.    clotrimazole-betamethasone (LOTRISONE) cream Apply 1 application topically 2 (two) times daily. Qty: 45 g, Refills: 1    docusate sodium (COLACE) 100 MG capsule Take 200 mg by mouth 2 (two) times daily.    ergocalciferol (VITAMIN D2) 50000 UNITS capsule Take 1 capsule (50,000 Units total) by mouth once a week. One capsule once weekly Qty: 4 capsule, Refills: 11   Associated Diagnoses: Unspecified vitamin D deficiency    fluticasone (FLONASE) 50 MCG/ACT nasal spray  Place 2 sprays into both nostrils daily. Qty: 16 g, Refills: 4   Associated Diagnoses: Seasonal allergies    isosorbide mononitrate (IMDUR) 30 MG 24 hr tablet Take 30 mg by mouth daily.    pravastatin (PRAVACHOL) 80 MG tablet Take 1 tablet (80 mg total) by mouth every evening. Qty: 90 tablet, Refills: 3    tamsulosin (FLOMAX) 0.4 MG CAPS capsule Take 0.4 mg by mouth daily.    warfarin (COUMADIN) 5 MG tablet Take 5 mg by mouth daily.      STOP taking these medications     carvedilol (COREG) 25 MG tablet      cephALEXin (KEFLEX) 250 MG capsule      enoxaparin (LOVENOX) 80 MG/0.8ML injection      furosemide (LASIX) 40 MG tablet      gabapentin (NEURONTIN) 100 MG capsule      lisinopril (PRINIVIL,ZESTRIL) 20 MG tablet      sodium chloride 0.9 % nebulizer solution      solifenacin (VESICARE) 10 MG tablet        No Known Allergies Follow-up Information    Follow up with Syliva Overman, MD.   Specialty:  Family Medicine   Contact information:   [address], [address] [place] [address] [phone]       Follow up with Chuck Hint, MD.   Specialty:  Vascular Surgery   Why:  4 week from surgery for staple removal   Contact information:   [address] [place] [address] [phone]        The results of significant diagnostics from this hospitalization (including imaging, microbiology, ancillary and laboratory) are listed below for reference.    Significant Diagnostic Studies: Ct Abdomen Pelvis Wo Contrast  10/05/2014   CLINICAL DATA:  Abdominal pain and cold right leg  EXAM: CT ABDOMEN AND PELVIS WITHOUT CONTRAST  TECHNIQUE: Multidetector CT imaging of the abdomen and pelvis was performed following the standard protocol without IV contrast.  COMPARISON:  None.  FINDINGS: Lung bases demonstrate some dependent right basilar atelectasis. Coronary calcifications are seen. The liver, spleen, adrenal glands and pancreas are within normal limits. Some  dependent density is noted within the gallbladder which may represent gallbladder sludge or very small gallstones. Multiple renal cysts are noted as well as renal vascular calcifications. No calculi or obstructive changes are seen.  The bladder is well distended. No pelvic mass lesion is noted. The appendix is not well seen although no inflammatory changes are noted. There is aneurysmal dilatation of the abdominal aorta to 5.2 x 4.7 cm. This extends to the level of the aortic bifurcation and also involves the right common iliac artery. It is distended to 4.7 x 5.2 cm. The more distal external iliac artery on the right shows no aneurysmal dilatation. 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  The osseous structures show postoperative change in the hips bilaterally. Degenerative  changes of the lumbar spine are seen.  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 Correlation with the peripheral pulses in the groin are recommended given the patient's clinical history and there may be occlusive changes within these aneurysms. No findings to suggest rupture are identified.  Recommend followup by abdomen and pelvis CTA in 3-6 months, and vascular surgery referral/consultation if not already obtained. This recommendation follows ACR consensus guidelines: White Paper of the ACR Incidental Findings Committee II on Vascular Findings. J Am Coll Radiol 2013; 10:789-794.  Dependent density within the gallbladder likely representing small stones or a gallbladder sludge.  Bilateral renal cysts   Electronically Signed   By: Alcide Clever M.D.   On: 10/05/2014 16:30   Ct Head Wo Contrast  10/05/2014   CLINICAL DATA:  Altered mental status  EXAM: CT HEAD WITHOUT CONTRAST  TECHNIQUE: Contiguous axial images were obtained from the base of the skull through the  vertex without intravenous contrast.  COMPARISON:  August 25, 2014  FINDINGS: There is moderate diffuse atrophy which is stable. There is encephalomalacia in both frontal lobes with aneurysm clips located in the midline slightly anterior to the lateral ventricles consistent with anterior cerebral artery aneurysm clipping. There is no intracranial mass, hemorrhage, extra-axial fluid collection, or midline shift. There is extensive small vessel disease in the centra semiovale bilaterally. There is no demonstrable acute infarct.  The bony calvarium appears intact except for bilateral frontal craniotomy defects. Mastoid air cells are clear. There is diffuse opacification of each maxillary antrum as well as multiple posterior ethmoid air cells and the sphenoid sinus complexes bilaterally. There is also moderate right frontal sinus opacification.  IMPRESSION: Atrophy with small vessel disease, stable. Encephalomalacia in both frontal lobes consistent with the previous aneurysm clipping surgery. No acute infarct apparent. No hemorrhage, mass, or extra-axial fluid collection. Postoperative change in each frontal bone. Extensive paranasal sinus disease. Mastoids bilaterally clear.   Electronically Signed   By: Bretta Bang III M.D.   On: 10/05/2014 16:25   Dg Chest Port 1 View  10/06/2014   CLINICAL DATA:  Encounter for central line placement  EXAM: PORTABLE CHEST - 1 VIEW  COMPARISON:  10/05/2014  FINDINGS: New left IJ central line, tip at the distal SVC. There is no pneumothorax or new mediastinal widening.  No cardiomegaly. There is aortic tortuosity which is accentuated by rotation. Streaky lower lung opacities compatible with atelectasis on abdominal CT 10/05/2014. No edema, effusion, or pneumothorax.  IMPRESSION: 1. Unremarkable positioning of the new left IJ central line. No pneumothorax. 2. Show inspiration with basilar atelectasis.   Electronically Signed   By: Marnee Spring M.D.   On: 10/06/2014 02:22    Dg Chest Portable 1 View  10/05/2014   CLINICAL DATA:  Altered mental status.  EXAM: PORTABLE CHEST - 1 VIEW  COMPARISON:  Single view of the chest and CT chest 10/08/2008.  FINDINGS: Lung volumes are low. No consolidative process, pneumothorax or effusion is identified. Heart size is upper normal.  IMPRESSION: No acute disease.   Electronically Signed   By: Drusilla Kanner M.D.   On: 10/05/2014 15:26   Dg Femur Port, 1v Right  10/07/2014   CLINICAL DATA:  74 year old male under preoperative evaluation.  EXAM: DG OPERATIVE RIGHT FEMUR  TECHNIQUE: Two frontal radiographs of the right femur are submitted.  COMPARISON:  08/24/2014.  FINDINGS: Two AP views of the right  femur demonstrate a long intramedullary rod traversing a healed fracture of the femoral diaphysis in the proximal and middle thirds, where there is abundant callus formation. No destructive bony lesion identified. Heterotopic bone formation adjacent to the proximal aspect of the intramedullary rod. 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  IMPRESSION: 1. Intramedullary rod in the right femur with healed fracture of the proximal 2/3 of the femoral diaphysis.   Electronically Signed   By: Trudie Reedaniel  Entrikin M.D.   On: 10/07/2014 09:47    Microbiology: Recent Results (from the past 240 hour(s))  Urine culture     Status: None   Collection Time: 10/05/14  4:15 PM  Result Value Ref Range Status   Specimen Description URINE, CATHETERIZED  Final   Special Requests NONE  Final   Colony Count   Final    >=100,000 COLONIES/ML Performed at [facility]    Culture   Final    ENTEROBACTER AEROGENES Performed at [facility]    Report Status 10/07/2014 FINAL  Final   Organism ID, Bacteria ENTEROBACTER AEROGENES  Final      Susceptibility   Enterobacter aerogenes - MIC*    CEFAZOLIN >=64 RESISTANT Resistant     CEFTRIAXONE 8 SENSITIVE Sensitive     CIPROFLOXACIN <=0.25 SENSITIVE Sensitive      GENTAMICIN <=1 SENSITIVE Sensitive     LEVOFLOXACIN <=0.12 SENSITIVE Sensitive     NITROFURANTOIN 64 INTERMEDIATE Intermediate     TOBRAMYCIN <=1 SENSITIVE Sensitive     TRIMETH/SULFA <=20 SENSITIVE Sensitive     PIP/TAZO >=128 RESISTANT Resistant     * ENTEROBACTER AEROGENES  Blood Culture (routine x 2)     Status: None   Collection Time: 10/05/14  4:45 PM  Result Value Ref Range Status   Specimen Description BLOOD RIGHT ARM  Final   Special Requests BOTTLES DRAWN AEROBIC AND ANAEROBIC 8CC  Final   Culture NO GROWTH 5 DAYS  Final   Report Status 10/10/2014 FINAL  Final  Blood Culture (routine x 2)     Status: None   Collection Time: 10/05/14  4:50 PM  Result Value Ref Range Status   Specimen Description BLOOD RIGHT HAND  Final   Special Requests BOTTLES DRAWN AEROBIC AND ANAEROBIC 8CC  Final   Culture NO GROWTH 5 DAYS  Final   Report Status 10/10/2014 FINAL  Final  MRSA PCR Screening     Status: None   Collection Time: 10/05/14  8:55 PM  Result Value Ref Range Status   MRSA by PCR NEGATIVE NEGATIVE Final    Comment:        The GeneXpert MRSA Assay (FDA approved for NASAL specimens only), is one component of a comprehensive MRSA colonization surveillance program. It is not intended to diagnose MRSA infection nor to guide or monitor treatment for MRSA infections.   Clostridium Difficile by PCR     Status: None   Collection Time: 10/06/14  6:09 AM  Result Value Ref Range Status   C difficile by pcr NEGATIVE NEGATIVE Final     Labs: Basic Metabolic Panel:  Recent Labs Lab 10/05/14 1503 10/05/14 1516 10/06/14 0434 10/07/14 0449 10/07/14 1344 10/08/14 0500 10/10/14 0535  NA 131* 134* 134* 131*  --  134* 136  K 5.1 5.1 3.9 3.4*  --  3.6 3.3*  CL 100 102 107 104  --  109 113*  CO2 20  --  18* 21  --  18* 20  GLUCOSE 141* 130*  157* 154*  --  107* 90  BUN 62* 55* 47* 30*  --  21 12  CREATININE 2.77* 2.70* 1.89* 1.30  --  0.96 0.71  CALCIUM 8.2*  --  8.0* 7.7*  --   7.7* 7.7*  MG  --   --  2.1  --  2.0  --   --   PHOS  --   --  3.4  --   --   --   --    Liver Function Tests:  Recent Labs Lab 10/05/14 1503  AST 111*  ALT 30  ALKPHOS 68  BILITOT 0.4  PROT 6.4  ALBUMIN 2.6*   No results for input(s): LIPASE, AMYLASE in the last 168 hours. No results for input(s): AMMONIA in the last 168 hours. CBC:  Recent Labs Lab 10/05/14 1503  10/06/14 0434 10/07/14 0449 10/08/14 0500 10/09/14 0539 10/11/14 0811  WBC 25.4*  --  25.0* 28.1* 17.8* 14.3* 9.3  NEUTROABS 22.5*  --   --   --   --   --   --   HGB 11.6*  < > 11.8* 11.0* 9.2* 9.8* 10.3*  HCT 43.5  < > 34.3* 32.2* 26.2* 28.1* 29.6*  MCV 120.2*  --  91.0 91.0 89.4 89.2 89.7  PLT 174  --  136* 122* 92* 98* 138*  < > = values in this interval not displayed. Cardiac Enzymes:  Recent Labs Lab 10/05/14 1503  TROPONINI 0.03   BNP: BNP (last 3 results)  Recent Labs  10/05/14 1503  BNP 165.0*    ProBNP (last 3 results) No results for input(s): PROBNP in the last 8760 hours.  CBG:  Recent Labs Lab 10/10/14 0751 10/10/14 1132 10/10/14 1644 10/10/14 2052 10/11/14 0820  GLUCAP 97 132* 115* 91 90       Signed:  Mikhaela Zaugg  [facility] 10/11/2014, 10:19 AM

## 2014-10-11 NOTE — Care Management Note (Signed)
CARE MANAGEMENT NOTE 10/11/2014  Patient:  [name] [name],[name] [name]   Account Number:  [id number]  Date Initiated:  10/06/2014  Documentation initiated by:  [facility],SARAH  Subjective/Objective Assessment:   Admitted from SNF - with mottling of legs - septic. VVS following.     Action/Plan:   Anticipated DC Date:  10/11/2014   Anticipated DC Plan:  SKILLED NURSING FACILITY  In-house referral  Clinical Social Worker      DC Planning Services  CM consult      Choice offered to / List presented to:             Status of service:  In process, will continue to follow Medicare Important Message given?  YES (If response is "NO", the following Medicare IM given date fields will be blank) Date Medicare IM given:  10/08/2014 Medicare IM given by:  Junius CreamerWELL,DEBBIE Date Additional Medicare IM given:  10/11/2014 Additional Medicare IM given by:  [facility] Dhriti Fales  Discharge Disposition:    Per UR Regulation:  Reviewed for med. necessity/level of care/duration of stay  If discussed at Long Length of Stay Meetings, dates discussed:    Comments:  ContactEustaquio Maize: Gay,Linda Other [phone]    Moore,Vivian Daughter [phone]  [phone]   10-06-14 9:30am Avie ArenasSarah Brown, RNBSN [phone] From SNF - consult placed.

## 2014-10-11 NOTE — Progress Notes (Signed)
ANTICOAGULATION CONSULT NOTE - Follow Up Consult  Pharmacy Consult for warfarin Indication: VTE Treatment  No Known Allergies  Patient Measurements: Weight: 177 lb 0.5 oz (80.3 kg)  Vital Signs: Temp: 99.3 F (37.4 C) (03/07 0824) Temp Source: Oral (03/07 0824) BP: 133/63 mmHg (03/07 0824) Pulse Rate: 69 (03/07 0824)  Labs:  Recent Labs  10/09/14 0539 10/10/14 0535 10/11/14 0811  HGB 9.8*  --  10.3*  HCT 28.1*  --  29.6*  PLT 98*  --  138*  LABPROT 15.4* 16.0* 21.9*  INR 1.21 1.27 1.89*  CREATININE  --  0.71 0.68    Estimated Creatinine Clearance: 82.2 mL/min (by C-G formula based on Cr of 0.68).  Assessment: 74 yo m admitted on 3/2 from SNF for LE discoloration, unable to assess BLE pulses. Patient is on warfarin for VTE treatment. INR has increased rapidly- 1.2 to 1.8-- Patient is also on cipro for UTI which potentiates INR.  Hgb increased to 10.3, plts also increased. No overt bleeding noted.  Goal of Therapy:  INR 2-3 Monitor platelets by anticoagulation protocol: Yes   Plan:  -Warfarin 2mg  x 1 tonight -daily INR -Monitor CBC, s/s of bleeding, clinical course  Jacqui Headen D. Autumm Hattery, PharmD, BCPS Clinical Pharmacist Pager: [phone] 10/11/2014 11:24 AM

## 2014-10-11 NOTE — Progress Notes (Addendum)
  Progress Note    10/11/2014 9:44 AM 4 Days Post-Op  Subjective:  Pain has improved, but still has soreness  3  Filed Vitals:   10/11/14 0824  BP: 133/63  Pulse: 69  Temp: 99.3 F (37.4 C)  Resp: 18    Physical Exam: Incisions:  C/d/i with staples in tact.  Small area of discoloration posteriorly at center of incision.  No drainage present.   CBC    Component Value Date/Time   WBC 14.3* 10/09/2014 0539   RBC 3.15* 10/09/2014 0539   HGB 9.8* 10/09/2014 0539   HCT 28.1* 10/09/2014 0539   PLT 98* 10/09/2014 0539   MCV 89.2 10/09/2014 0539   MCH 31.1 10/09/2014 0539   MCHC 34.9 10/09/2014 0539   RDW 13.1 10/09/2014 0539   LYMPHSABS 1.1 10/05/2014 1503   MONOABS 1.7* 10/05/2014 1503   EOSABS 0.0 10/05/2014 1503   BASOSABS 0.0 10/05/2014 1503    BMET    Component Value Date/Time   NA 136 10/10/2014 0535   K 3.3* 10/10/2014 0535   CL 113* 10/10/2014 0535   CO2 20 10/10/2014 0535   GLUCOSE 90 10/10/2014 0535   BUN 12 10/10/2014 0535   CREATININE 0.71 10/10/2014 0535   CREATININE 1.23 12/07/2013 0812   CALCIUM 7.7* 10/10/2014 0535   GFRNONAA >90 10/10/2014 0535   GFRNONAA 58* 12/07/2013 0812   GFRAA >90 10/10/2014 0535   GFRAA 67 12/07/2013 0812    INR    Component Value Date/Time   INR 1.27 10/10/2014 0535   INR 2.3 04/08/2013 0834   INR 3.2 10/04/2010 1022     Intake/Output Summary (Last 24 hours) at 10/11/14 0944 Last data filed at 10/11/14 [id number]  Gross per 24 hour  Intake    920 ml  Output      0 ml  Net    920 ml     Assessment/Plan:  74 y.o. male is s/p right above knee amputation  4 Days Post-Op  -pt doing well and pain is well controlled -there is a small area of discoloration posteriorly mid section-otherwise, stump is healing nicely. -continue to observe stump -ordered a retention sock   Doreatha MassedSamantha Rhyne, PA-C [facility] [phone] 10/11/2014 9:44 AM  Agree with above. OK for d/c from our standpoint. We will  remove staples 4 weeks post op  Waverly Ferrarihristopher Dickson, MD, FACS Bee[employer] [phone] Office: [phone]

## 2014-10-11 NOTE — Progress Notes (Signed)
Speech Language Pathology Treatment: Dysphagia  Patient Details Name: [name] SawyersDavid T [name] MRN: [id number] DOB: 1941/06/24 Today's Date: 10/11/2014 Time: 1000-1018 SLP Time Calculation (min) (ACUTE ONLY): 18 min  Assessment / Plan / Recommendation Clinical Impression  The patient was seen for follow up for therapeutic diet tolerance.  The patient is currently on a Dysphagia 1 diet with honey thick liquids.  The patient reports that he feels he is doing well on this diet.  Meal observation was completed for which the patient self fed.  The patient appeared to tolerate the Dysphagia 1 texture well.  There were no overt sign/symptoms of aspiration.  On the honey liquid boluses, which were also self fed via cup, the patient had intermittent throat clear.  Currently the patient's lung are within defined limits except diminished.  Recommend continue with the current diet and continue to follow.  MBSS will need to be considered if the patient has any changes in respiratory status.     HPI HPI: 74 y.o. male s/p Rt AKA, with Hx of AAA. Pt with a reported hx of dysphagia, which he clarifies has been since previous CVA. Most recent MBS in chart from 04/2010 reveals aspiration of thin liquids and penetration of nectar thick liquids. Pt says that PTA his diet consisted of pureed solids and honey thick liquids. He continues to work with speech therapy, and he says he has not been able to advance his diet textures.   Pertinent Vitals Pain Assessment: No/denies pain Pain Score: 0-No pain Faces Pain Scale: Hurts whole lot Pain Location: c/o pain in Lt LE with movement  Pain Descriptors / Indicators: Grimacing Pain Intervention(s): Repositioned;Monitored during session;Premedicated before session  SLP Plan  Continue with current plan of care    Recommendations Diet recommendations: Dysphagia 1 (puree);Honey-thick liquid Liquids provided via: Cup Medication Administration: Whole meds with liquid Supervision: Patient  able to self feed Compensations: Slow rate;Small sips/bites Postural Changes and/or Swallow Maneuvers: Seated upright 90 degrees              Oral Care Recommendations: Oral care BID Follow up Recommendations: Skilled Nursing facility Plan: Continue with current plan of care    GO     Fleet ContrasGoodman, Audra Kagel N 10/11/2014, 10:22 AM  Dimas AguasMelissa Reyes Aldaco, MA, CCC-SLP Acute Rehab SLP [phone]

## 2014-10-11 NOTE — Clinical Social Work Placement (Addendum)
Clinical Social Work Department CLINICAL SOCIAL WORK PLACEMENT NOTE 10/11/2014  Patient:  [name] [name],[name] [name]  Account Number:  [id number] Admit date:  10/05/2014  Clinical Social Worker:  Genelle BalVANESSA Donathan Buller, LCSW  Date/time:  10/11/2014 03:07 AM  Clinical Social Work is seeking post-discharge placement for this patient at the following level of care:   SKILLED NURSING   (*CSW will update this form in Epic as items are completed)     Patient/family provided with [facility] Department of Clinical Social Work's list of facilities offering this level of care within the geographic area requested by the patient (or if unable, by the patient's family).  10/11/2014  Patient/family informed of their freedom to choose among providers that offer the needed level of care, that participate in Medicare, Medicaid or managed care program needed by the patient, have an available bed and are willing to accept the patient.    Patient/family informed of [facility]' ownership interest in [facility], as well as of the fact that they are under no obligation to receive care at this facility.  PASARR submitted to EDS in 2011  PASARR number received on in 2011  FL2 transmitted to all facilities in geographic area requested by pt/family on  10/11/2014 FL2 transmitted to all facilities within larger geographic area on   Patient informed that his/her managed care company has contracts with or will negotiate with  certain facilities, including the following:     Patient/family informed of bed offers received:  10/11/2014 [facility] Physician recommends and patient chooses bed at    [facility] on  10/11/2014 Patient to be transferred to facility by ambulance Patient and family notified of transfer on 10/11/2014 Name of family member notified:  [name] [name], daughter.  The following physician request were entered in  Epic:  Additional Comments: 10/11/14: Patient came to hospital from [facility] in [place], however patient does not want to return there and patient's daughter, [name] [name] [phone]. [phone]) is in agreement. New bed search initiated and patient will discharge 3/7 to [facility], [facility] by [facility].

## 2014-10-11 NOTE — Progress Notes (Signed)
Physical Therapy Treatment Patient Details Name: [name] [name] MRN: [id number] DOB: [birth year] Today's Date: 10/11/2014    History of Present Illness 74 y.o. male s/p Rt AKA, with Hx of AAA and CVA    PT Comments    Pt requiring 2 person (A) to sit EOB. Pt with LOB while sitting EOB; required 2 attempts to sit ~5 min. Repositioned in bed and placed in chair position for breakfast.   Follow Up Recommendations  SNF     Equipment Recommendations  None recommended by PT    Recommendations for Other Services       Precautions / Restrictions Precautions Precautions: Fall Restrictions Weight Bearing Restrictions: Yes RLE Weight Bearing: Non weight bearing    Mobility  Bed Mobility Overal bed mobility: Needs Assistance;+2 for physical assistance Bed Mobility: Rolling;Supine to Sit;Sit to Supine Rolling: Max assist;+2 for physical assistance   Supine to sit: Total assist;+2 for physical assistance;HOB elevated Sit to supine: Max assist;+2 for physical assistance   General bed mobility comments: 2 person (A) to bring to sitting position at EOB; use of draw pad to bring hips to EOB; pt with poor / weak turnk control; heavily relying on UE support today; pt with LOB posteriorly with initial sittnig EOB and required 2nd attempt to sit ~5 min   Transfers                    Ambulation/Gait                 Stairs            Wheelchair Mobility    Modified Rankin (Stroke Patients Only)       Balance                                    Cognition Arousal/Alertness: Awake/alert Behavior During Therapy: Flat affect Overall Cognitive Status: Difficult to assess                      Exercises General Exercises - Lower Extremity Long Arc Quad: Strengthening;AAROM;Left;10 reps;Seated Low Level/ICU Exercises Ankle Circles/Pumps: AAROM;Left;10 reps;Supine    General Comments        Pertinent Vitals/Pain Pain Assessment:  Faces Faces Pain Scale: Hurts whole lot Pain Location: c/o pain in Lt LE with movement  Pain Descriptors / Indicators: Grimacing Pain Intervention(s): Repositioned;Monitored during session;Premedicated before session    Home Living                      Prior Function            PT Goals (current goals can now be found in the care plan section) Acute Rehab PT Goals Patient Stated Goal: none stated PT Goal Formulation: With patient/family Time For Goal Achievement: 10/22/14 Potential to Achieve Goals: Fair Progress towards PT goals: Progressing toward goals    Frequency  Min 2X/week    PT Plan Current plan remains appropriate    Co-evaluation             End of Session Equipment Utilized During Treatment: Oxygen Activity Tolerance: Patient limited by fatigue Patient left: in bed;with call bell/phone within reach;with SCD's reapplied     Time: [id number] PT Time Calculation (min) (ACUTE ONLY): 18 min  Charges:  $Therapeutic Activity: 8-22 mins  G CodesDonell Sievert, [place]  [phone] 10/11/2014, 9:06 AM

## 2014-10-11 NOTE — Progress Notes (Signed)
Orthopedic Tech Progress Note Patient Details:  Delrae SawyersDavid T Faeth 07-Aug-1940 [id number] [employer] called for brace order Patient ID: Delrae Sawyersavid T Fait, male   DOB: 07-Aug-1940, 74 y.o.   MRN: [id number]   Orie Routsia R Thompson 10/11/2014, 11:22 AM

## 2014-10-11 NOTE — Progress Notes (Signed)
Patient discharged to SNF, transferred via ambulance. Personal belongings were put in a bag along with extra retention sock and given to transporter. Patient agreed and verbalized understanding. No further questions at this moment   [name] [name], [name] [name] n 4:18 PM 10/11/2014

## 2014-10-12 DIAGNOSIS — I739 Peripheral vascular disease, unspecified: Secondary | ICD-10-CM | POA: Diagnosis not present

## 2014-10-12 DIAGNOSIS — I714 Abdominal aortic aneurysm, without rupture: Secondary | ICD-10-CM | POA: Diagnosis not present

## 2014-10-12 DIAGNOSIS — Z89611 Acquired absence of right leg above knee: Secondary | ICD-10-CM | POA: Diagnosis not present

## 2014-10-12 DIAGNOSIS — Z86711 Personal history of pulmonary embolism: Secondary | ICD-10-CM | POA: Diagnosis not present

## 2014-10-13 ENCOUNTER — Encounter: Payer: Self-pay | Admitting: Family Medicine

## 2014-10-13 ENCOUNTER — Ambulatory Visit: Payer: Medicare Other | Admitting: Family Medicine

## 2014-10-15 ENCOUNTER — Inpatient Hospital Stay
Admission: RE | Admit: 2014-10-15 | Discharge: 2014-11-06 | Disposition: A | Discharge status: Nursing Home (Includes ICF) | Payer: Medicare Other | Source: Ambulatory Visit | Attending: Internal Medicine | Admitting: Internal Medicine

## 2014-10-15 ENCOUNTER — Encounter (HOSPITAL_COMMUNITY)
Admission: RE | Admit: 2014-10-15 | Discharge: 2014-10-15 | Disposition: A | Discharge status: Home / Self Care | Payer: Medicare Other | Source: Ambulatory Visit | Attending: Internal Medicine | Admitting: Internal Medicine

## 2014-10-15 ENCOUNTER — Non-Acute Institutional Stay (SKILLED_NURSING_FACILITY): Payer: Medicare Other | Admitting: Internal Medicine

## 2014-10-15 DIAGNOSIS — I714 Abdominal aortic aneurysm, without rupture, unspecified: Secondary | ICD-10-CM

## 2014-10-15 DIAGNOSIS — Z8673 Personal history of transient ischemic attack (TIA), and cerebral infarction without residual deficits: Secondary | ICD-10-CM

## 2014-10-15 DIAGNOSIS — Z7901 Long term (current) use of anticoagulants: Secondary | ICD-10-CM | POA: Diagnosis not present

## 2014-10-15 DIAGNOSIS — I10 Essential (primary) hypertension: Secondary | ICD-10-CM | POA: Diagnosis not present

## 2014-10-15 DIAGNOSIS — Z89619 Acquired absence of unspecified leg above knee: Secondary | ICD-10-CM | POA: Insufficient documentation

## 2014-10-15 DIAGNOSIS — Z89611 Acquired absence of right leg above knee: Secondary | ICD-10-CM | POA: Diagnosis not present

## 2014-10-15 DIAGNOSIS — R791 Abnormal coagulation profile: Secondary | ICD-10-CM | POA: Diagnosis not present

## 2014-10-15 DIAGNOSIS — Z86711 Personal history of pulmonary embolism: Secondary | ICD-10-CM | POA: Diagnosis not present

## 2014-10-15 NOTE — Progress Notes (Signed)
Patient ID: [name] SawyersDavid T [name], male   DOB: Jan 23, 1941, 74 y.o.   MRN: [id number]   This is an acute visit.  Level care skilled.  Fac[facility].  Chief complaint-acute visit status post hospitalization for septic shock secondary to UTI an ischemic limb-status post right AKA.  History of present illness.  Patient is a pleasant 74 year old male with a very complicated medical history including history of CVA pulmonary embolism on chronic anticoagulation with Coumadin-as well as hypertension and hyperlipidemia and severe peripheral last her disease.  He presented to the ER with lethargy and found have a UTI septic shock and mottling in the lower extremities-he was transferred to ICU and had avascular surgery evaluation-initially he needed pressors and wasn't stable enough to go to the operating room for limb salvage-he was treated with empiric antibiotics steroids pressors and fluid resuscitation and was stabilized enough to go to the operating room for an above-the-knee amputation on the right.  Upon hospital discharge he did go to a nursing home in need an but has been transferred here per family request.  Currently he has no acute complaints other than some pain at the amputation site he does have hydrocodone as needed this will have to be monitored.  He does have 2 wounds one in his right buttocks and also in the sacral area this will certainly need follow-up by wound care.  Previous medical history.  History of septic shock secondary to UTI and ischemic limb.  Status post right AKA.  Peripheral arterial disease.  Toxic encephalopathy.  Anemia.  History of pulmonary embolism on chronic anticoagulation.  History of CVA.  Thrombocytopenia.  Abdominal aortic aneurysm.  Hypertension.  Hyperlipidemia.  Family medical social history reviewed per discharge note on 10/11/2014 and admission note on 10/05/2014.  Of note does have a extensive history of tobacco use 55  years-apparently still had been smoking-denies significant alcohol or illicit drug use.  Parents are both deceased-he has 1 sister who is still living in apparently a brother who is deceased.  His responsible party is his daughter.  Medications.  Tylenol as 325 mg 1 or 2 tabs every 4 hours when necessary.  Vicodin 12/07/2023 one tablet every 4 when necessary pain.  B complex vitamins thousand micrograms daily.  Celexa 40 mg daily.  Lotrisone cream twice a day.  Colace 100 mg 2 tabs twice a day.  Vitamin D2 50,000 units once a week.  Flonase 2 sprays in both nostrils daily.  Imdur 30 mg daily.  Pravachol 80 mg daily at bedtime.  Flomax 0.4 mg daily.  Coumadin 5 mg daily.  I review of systems this is somewhat limited secondary to patient's relatively a phasic status  Physical exam. Temperature 98.7 pulse 82 respirations 20 blood pressure 94/54 In general this is a pleasant elderly male in no distress lying comfortably in bed he is alert and responsive --  He does have relative aphasia.  His skin is warm and dry he does have 2 significant wounds 1 on the right buttocks the other larger wound is on the sacral area-these appear to be unstageable there is a fair amount of slough of the sacral wound-I do not see sign of infection there is not exquisite tenderness drainage or an odor appreciated at this time-.  Eyes pupils appear reactive to light sclera and conjunctiva are clear.  Oropharynx mucous membranes are moist he is edentulous.  Chest is clear to auscultation no labored breathing.  Heart is regular rate and rhythm with occasional  irregular beat he does not have significant lower extremity edema it was difficult to palpate a pulse on the left foot.  Abdomen is soft nontender positive bowel sounds.  Musculoskeletal moves all extremities at baseline he is status post right above-the-knee amputation surgical site Staples are in place there is very minimal erythema  there is not really any sign of infection here it is dry minimally tender to palpation this appears to be quite stable.  Neurologic again he is a phasic relatively but is able to communicate does move all his extremities again at baseline.  Psych somewhat limited secondary to patient's communication difficulties but he appears grossly alert and oriented pleasant and appropriate follows commands without any difficulty.  Labs.  10/10/2014.  Sodium 136 potassium 3.3 BUN 12 creatinine 0.71.  10/11/2014.  WBC 9.3 hemoglobin 10.3 platelets 138.  10/05/2014 AST 111 otherwise liver function tests within normal limits except albumin of 2.6   Assessment and plan.  #1 history of sepsis multifactorial including UTI as well as infected limb status post amputation right AKA-at this point appears stable in this regards-he will need extensive therapy-wound at this point surgical site appears to be stable he is completing a course of ciprofloxacin for the UTI.  He is receiving Vicodin as needed for pain this will have to be monitored  #2-history of pulmonary embolism on chronic anticoagulation with Coumadin-we will need to check an INR-clinically appears stable in this regards.  #3 history of sacral and buttocks wound as noted above-this will need close follow-up-have discussed this with nursing t--wound carehey will assess and treat I suspect he will be receiving Santyl and wet to dry dressings-at this point wounds do not appear be infected but certainly are vulnerable.  #4 history of CVA-history on this is somewhat unclear he is on anticoagulation already he is also on a statin  #5-history of BPH se is on Flomax.  History of depression continues on Celexa this will have to be monitored.  #7 history of abdominal aortic aneurysm-per review of Epic I do not see a study on this this will have to be discussed with his daughter when she is available for follow-up  Blood pressure was systolic in the  16X today-he is asymptomatic I note he is on Imdur at this point will monitor  Of note I do note he has slight hypokalemia on recent lab will update a metabolic panel also will update a CBC as well as a PT/INR.  [id number] note greater than 40 minutes spent assessing patient-reviewing his chart-and coordinating and formulating a plan of care for numerous diagnoses-of note greater than 50% of time spent coordinating plan of care

## 2014-10-16 ENCOUNTER — Other Ambulatory Visit (HOSPITAL_COMMUNITY)
Admission: RE | Admit: 2014-10-16 | Discharge: 2014-10-16 | Disposition: A | Discharge status: Home / Self Care | Payer: Medicare Other | Source: Other Acute Inpatient Hospital | Attending: Internal Medicine | Admitting: Internal Medicine

## 2014-10-16 LAB — PROTIME-INR
INR: 3.46 — AB (ref 0.00–1.49)
Prothrombin Time: 35.1 seconds — ABNORMAL HIGH (ref 11.6–15.2)

## 2014-10-17 ENCOUNTER — Non-Acute Institutional Stay (SKILLED_NURSING_FACILITY): Payer: Medicare Other | Admitting: Internal Medicine

## 2014-10-17 ENCOUNTER — Encounter (HOSPITAL_COMMUNITY)
Admission: RE | Admit: 2014-10-17 | Discharge: 2014-10-17 | Disposition: A | Discharge status: Home / Self Care | Payer: Medicare Other | Source: Skilled Nursing Facility | Attending: Internal Medicine | Admitting: Internal Medicine

## 2014-10-17 DIAGNOSIS — A419 Sepsis, unspecified organism: Secondary | ICD-10-CM | POA: Diagnosis not present

## 2014-10-17 DIAGNOSIS — R6520 Severe sepsis without septic shock: Secondary | ICD-10-CM

## 2014-10-17 DIAGNOSIS — D6832 Hemorrhagic disorder due to extrinsic circulating anticoagulants: Secondary | ICD-10-CM

## 2014-10-17 DIAGNOSIS — Z89611 Acquired absence of right leg above knee: Secondary | ICD-10-CM | POA: Diagnosis not present

## 2014-10-17 DIAGNOSIS — D699 Hemorrhagic condition, unspecified: Secondary | ICD-10-CM

## 2014-10-17 DIAGNOSIS — T45515A Adverse effect of anticoagulants, initial encounter: Secondary | ICD-10-CM

## 2014-10-17 LAB — PROTIME-INR
INR: 2.69 — AB (ref 0.00–1.49)
PROTHROMBIN TIME: 28.8 s — AB (ref 11.6–15.2)

## 2014-10-18 ENCOUNTER — Encounter (HOSPITAL_COMMUNITY)
Admission: RE | Admit: 2014-10-18 | Discharge: 2014-10-18 | Disposition: A | Discharge status: Home / Self Care | Payer: Medicare Other | Source: Skilled Nursing Facility | Attending: Internal Medicine | Admitting: Internal Medicine

## 2014-10-18 ENCOUNTER — Non-Acute Institutional Stay (SKILLED_NURSING_FACILITY): Payer: Medicare Other | Admitting: Internal Medicine

## 2014-10-18 DIAGNOSIS — L89322 Pressure ulcer of left buttock, stage 2: Secondary | ICD-10-CM | POA: Diagnosis not present

## 2014-10-18 DIAGNOSIS — R109 Unspecified abdominal pain: Secondary | ICD-10-CM | POA: Diagnosis not present

## 2014-10-18 LAB — CLOSTRIDIUM DIFFICILE BY PCR: Toxigenic C. Difficile by PCR: NEGATIVE

## 2014-10-18 NOTE — Progress Notes (Signed)
Patient ID: [name] SawyersDavid T [name], male   DOB: 1941/02/07, 74 y.o.   MRN: [id number] Facility; [facility] Chief complaint; wounds over his coccyx and left ischial tuberosity History; this is a man that I admitted to the facility yesterday. He came to this nursing home from another nursing home. He was in [facility] from 3/1 through 3/7. That he had gangrene and septic shock involving his left leg. He required ICU care with pressors on admission therefore no attempt could be made to save his leg and he underwent a left proximal left above-knee amputation. He came to us with an INR over 4 probably due to a drug interaction with Cipro which showed completed on 3/12. His Coumadin was appropriately held and I restarted that yesterday. 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  Unfortunately I did not turn this man completely over yesterday I did look at his heel on his right foot. If I had turned him over I would've noticed extensive pressure ulceration in a butterfly distribution over his coccyx. There is also stage II pressure areas over his ischial tuberosities. According to the information I have these were present before the hospitalization although I see nothing about this in his hospital records at least his discharge summary.  Review of systems; I find it difficult to understand this man although I think he is fairly intact he is very dysarthric secondary to a prior arachnoid hemorrhage. GI; he does not complain of abdominal pain and no RMI certain about his bowel movement frequency. Nevertheless when I turned him over there is a mild amount of liquid stool  Physical examination Gen. the patient does not appear to be unwell Respiratory clear entry bilaterally Cardiac; heart sounds are normal he appears to be euvolemic Abdomen; bowel sounds are present. He is diffusely tender without guarding or rebound GU no suprapubic or costovertebral angle tenderness Skin;  in a butterfly distribution over his coccyx extending into his coccyx area in his buttock's is a rather extensive wound. This is covered with eschar. At this point I don't believe this is infected there is no clear tenderness or crepitus. Also over the bottom part of his ischial tuberosity his a stage II area. All of these areas have surface eschar that will need enzymatic and possibly debridement.   Impression/plan #1 extensive area of ulceration as described. I think the correct approach here is Santyl which is already been started with a foam base cover. This may need mechanical debridement. As of today I didn't think this was infected. #2 abdominal tenderness. When I was looking at the wounds on his bottom there is liquid stool. Is certainly at high-risk for C. difficile infection which I will lean towards checking.  At this point I am not completely certain about the history of these wounds however what I am hearing from the nurses is that these date prior to his hospitalization.
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
## 2014-10-19 ENCOUNTER — Other Ambulatory Visit (HOSPITAL_COMMUNITY)
Admission: AD | Admit: 2014-10-19 | Discharge: 2014-10-19 | Disposition: A | Discharge status: Home / Self Care | Payer: Medicare Other | Source: Skilled Nursing Facility | Attending: Internal Medicine | Admitting: Internal Medicine

## 2014-10-19 LAB — CBC WITH DIFFERENTIAL/PLATELET
Basophils Absolute: 0 10*3/uL (ref 0.0–0.1)
Basophils Relative: 0 % (ref 0–1)
Eosinophils Absolute: 0.2 10*3/uL (ref 0.0–0.7)
Eosinophils Relative: 3 % (ref 0–5)
HCT: 29.1 % — ABNORMAL LOW (ref 39.0–52.0)
Hemoglobin: 9.9 g/dL — ABNORMAL LOW (ref 13.0–17.0)
Lymphocytes Relative: 23 % (ref 12–46)
Lymphs Abs: 1.8 10*3/uL (ref 0.7–4.0)
MCH: 32 pg (ref 26.0–34.0)
MCHC: 34 g/dL (ref 30.0–36.0)
MCV: 94.2 fL (ref 78.0–100.0)
Monocytes Absolute: 0.6 10*3/uL (ref 0.1–1.0)
Monocytes Relative: 8 % (ref 3–12)
NEUTROS PCT: 66 % (ref 43–77)
Neutro Abs: 5 10*3/uL (ref 1.7–7.7)
PLATELETS: 230 10*3/uL (ref 150–400)
RBC: 3.09 MIL/uL — ABNORMAL LOW (ref 4.22–5.81)
RDW: 14.9 % (ref 11.5–15.5)
WBC: 7.6 10*3/uL (ref 4.0–10.5)

## 2014-10-19 LAB — PROTIME-INR
INR: 3.53 — AB (ref 0.00–1.49)
PROTHROMBIN TIME: 35.6 s — AB (ref 11.6–15.2)

## 2014-10-20 ENCOUNTER — Non-Acute Institutional Stay (SKILLED_NURSING_FACILITY): Payer: Medicare Other | Admitting: Internal Medicine

## 2014-10-20 ENCOUNTER — Other Ambulatory Visit (HOSPITAL_COMMUNITY)
Admission: AD | Admit: 2014-10-20 | Discharge: 2014-10-20 | Disposition: A | Discharge status: Home / Self Care | Payer: Medicare Other | Source: Skilled Nursing Facility | Attending: Internal Medicine | Admitting: Internal Medicine

## 2014-10-20 ENCOUNTER — Encounter: Payer: Self-pay | Admitting: Internal Medicine

## 2014-10-20 DIAGNOSIS — I9589 Other hypotension: Secondary | ICD-10-CM

## 2014-10-20 DIAGNOSIS — I2699 Other pulmonary embolism without acute cor pulmonale: Secondary | ICD-10-CM | POA: Diagnosis not present

## 2014-10-20 DIAGNOSIS — Z89611 Acquired absence of right leg above knee: Secondary | ICD-10-CM | POA: Diagnosis not present

## 2014-10-20 DIAGNOSIS — I714 Abdominal aortic aneurysm, without rupture, unspecified: Secondary | ICD-10-CM

## 2014-10-20 DIAGNOSIS — Z7901 Long term (current) use of anticoagulants: Secondary | ICD-10-CM

## 2014-10-20 LAB — PROTIME-INR
INR: 2.98 — ABNORMAL HIGH (ref 0.00–1.49)
Prothrombin Time: 31.2 seconds — ABNORMAL HIGH (ref 11.6–15.2)

## 2014-10-20 NOTE — Progress Notes (Signed)
Patient ID: [name] [name], male   DOB: [birth year], 74 y.o.   MRN: [id number]   This is an acute visit  Level care skilled.  Facility [facility].  Chief complaint-acute visit  Follow-up anticoagulation management on chronic Coumadin with history of DVT and PE--follow-up abdominal aortic aneurysm  History of present illness.  Patient is a pleasant 74 year old male with a very complicated medical history including history of CVA pulmonary embolism on chronic anticoagulation with Coumadin-as well as hypertension and hyperlipidemia and severe peripheral last her disease.  He presented to the ER with lethargy and found have a UTI septic shock and mottling in the lower extremities-he was transferred to ICU and had avascular surgery evaluation-initially he needed pressors and wasn't stable enough to go to the operating room for limb salvage-he was treated with empiric antibiotics steroids pressors and fluid resuscitation and was stabilized enough to go to the operating room for an above-the-knee amputation on the right.  Upon hospital discharge he did go to a nursing home in need an but has been transferred here per family request.   He has completed a course of ciprofloxacin with history of sepsis-INR has been somewhat difficult to manage came in originally with an INR over 4--his Coumadin was held for 2 days and this did normalize at which point Coumadin was restarted at 4 mg a day however subsequently rose again and as yesterday was 3.53-his Coumadin was held last night his INR today is 2.98.  There's been no increased bruising or bleeding clinically he appears stable vital signs stable he has no acute complaints.  Also noted during previous visit he does have a previous history of an abdominal aortic aneurysm-however information on this is quite sketchy-is able to speak with his daughter today who states that this was diagnosed during his most recent hospitalization and according to his daughter  about it was thought to require any surgical intervention at this point-she stated they mentioned 5.3 which I expect is the size of the aneurysm-she stated he was not considered a surgical candidate unless it was greater than 5.5  Cm   Previous medical history.  History of septic shock secondary to UTI and ischemic limb.  Status post right AKA.  Peripheral arterial disease.  Toxic encephalopathy.  Anemia.  History of pulmonary embolism on chronic anticoagulation.  History of CVA.  Thrombocytopenia.  Abdominal aortic aneurysm.  Hypertension.  Hyperlipidemia.  Family medical social history reviewed per discharge note on 10/11/2014 and admission note on 10/05/2014.  Of note does have a extensive history of tobacco use 55 years-apparently still had been smoking-denies significant alcohol or illicit drug use.  Parents are both deceased-he has 1 sister who is still living in apparently a brother who is deceased.  His responsible party is his daughter.  Medications.  Tylenol as 325 mg 1 or 2 tabs every 4 hours when necessary.  Vicodin 12/07/2023 one tablet every 4 when necessary pain.  B complex vitamins thousand micrograms daily.  Celexa 40 mg daily.  Lotrisone cream twice a day.  Colace 100 mg 2 tabs twice a day.  Vitamin D2 50,000 units once a week.  Flonase 2 sprays in both nostrils daily.  Imdur 30 mg daily.  Pravachol 80 mg daily at bedtime.  Flomax 0.4 mg daily.  Coumadin 4 mg daily--held last night.  I review of systems this is somewhat limited secondary to patient's relatively a phasic status--nursing staff has not noted any issues that he is doing well he appears  to agree with this does not complain of pain-  Physical exam . Temperature 98.4 pulse 66 respirations 20 blood pressure 135/82  In general this is a pleasant elderly male in no distress lying comfortably in bed he is alert and responsive --  He does have relative aphasia.  His skin is  warm and dry he does have 2 significant wounds 1 on the right buttocks the other larger wound is on the sacral area--these are followed by wound care as well as Dr. Leanord Hawking   Eyes pupils appear reactive to light sclera and conjunctiva are clear.  Oropharynx mucous membranes are moist he is edentulous.  Chest is clear to auscultation no labored breathing.  Heart is regular rate and rhythm with occasional irregular beat he does not have significant lower extremity edema it was difficult to palpate a pulse on the left foot.  Abdomen is soft nontender positive bowel sounds.  Musculoskeletal moves all extremities at baseline he is status post right above-the-knee amputation surgical site there is not really any sign of infection here it is dry minimally tender to palpation this appears to be quite stable.  GU cannot really appreciate any suprapubic tenderness  Neurologic again he is a phasic relatively but is able to communicate does move all his extremities again at baseline.  Psych somewhat limited secondary to patient's communication difficulties but he appears grossly alert and oriented pleasant and appropriate follows commands without any difficulty.  Labs.  10/20/2014.  INR 2.98.  10/19/2014.  INR 3.53.  WBC 7.6 hemoglobin 9.9 platelets 2:30.  10/15/2014.  Sodium 139 potassium 3.5 BUN 14 creatinine 0.76 at that time INR was 4.07  10/10/2014.  Sodium 136 potassium 3.3 BUN 12 creatinine 0.71.  10/11/2014.  WBC 9.3 hemoglobin 10.3 platelets 138.  10/05/2014 AST 111 otherwise liver function tests within normal limits except albumin of 2.6   Assessment and plan.  #1 history of sepsis multifactorial including UTI as well as infected limb status post amputation right AKA-at this point appears stable in this regards-he will need extensive therapy-wound at this point surgical site appears to be stable he has completed a course of ciprofloxacin for the UTI.  He is receiving  Vicodin as needed for pain this will have to be monitored  #2-history of pulmonary embolism on chronic anticoagulation with Coumadin-INR has returned to high normal status-Coumadin was held last night will restart at a lower dose at 3 mg a day this will have to be checked carefully however will check an INR tomorrow secondary to quite a bit of variability here since he's arrived--also will check a CBC hemoglobin has dropped a small bit since admission would like to see where we stand  #3 history of sacral and buttocks wound as noted above- This is followed by Dr. Leanord Hawking as well as wound care.  #4 history of CVA-he is on anticoagulation already he is also on a statin  #5-history of BPH se is on Flomax.  History of depression continues on Celexa appears to be stable  #7 history of abdominal aortic aneurysm-upon further extensive review of Epic I do see a noncontrast CT of the abdomen was performed on March 1--this was also noted in initial note on March 1 by the physician-there is a note about 5.3 centimeter aortic aneurysm-it appears suggested follow-up CT in 3-6 months-apparently speaking with his daughter this is a new diagnosis  Suspect patient would be a challenging surgical candidate if this was necessary but per daughter was told that he  was not a surgical candidate at this point if the aneurysm was less than 5.5cm--at this point will monitor clinically he appears stable    Hypotension??-I do note when he first came here he had a low systolic reading in the 90s however this appears to be transitory recent blood pressures 135/82-141/66 at this point will monitor--he is not on any antihypertensives other than Imdur   [id number]--of note greater than 40 minutes spent assessing patient-reviewing his chart-discussing his status with nursing staff as well as with his daughterr-extensive review of previous notes-and coordinating and formulating a plan of care-of note greater than 50% of time spent  coordinating plan of care       .

## 2014-10-21 LAB — CBC
HEMATOCRIT: 30.1 % — AB (ref 39.0–52.0)
HEMOGLOBIN: 10 g/dL — AB (ref 13.0–17.0)
MCH: 31.6 pg (ref 26.0–34.0)
MCHC: 33.2 g/dL (ref 30.0–36.0)
MCV: 95.3 fL (ref 78.0–100.0)
Platelets: 214 10*3/uL (ref 150–400)
RBC: 3.16 MIL/uL — AB (ref 4.22–5.81)
RDW: 15.1 % (ref 11.5–15.5)
WBC: 5.5 10*3/uL (ref 4.0–10.5)

## 2014-10-21 LAB — PROTIME-INR
INR: 2.96 — ABNORMAL HIGH (ref 0.00–1.49)
PROTHROMBIN TIME: 31.1 s — AB (ref 11.6–15.2)

## 2014-10-22 ENCOUNTER — Other Ambulatory Visit (HOSPITAL_COMMUNITY)
Admission: AD | Admit: 2014-10-22 | Discharge: 2014-10-22 | Disposition: A | Discharge status: Home / Self Care | Payer: Medicare Other | Source: Skilled Nursing Facility | Attending: Internal Medicine | Admitting: Internal Medicine

## 2014-10-22 DIAGNOSIS — Z7901 Long term (current) use of anticoagulants: Secondary | ICD-10-CM | POA: Insufficient documentation

## 2014-10-22 DIAGNOSIS — R69 Illness, unspecified: Secondary | ICD-10-CM | POA: Insufficient documentation

## 2014-10-22 LAB — PROTIME-INR
INR: 2.88 — AB (ref 0.00–1.49)
Prothrombin Time: 30.4 seconds — ABNORMAL HIGH (ref 11.6–15.2)

## 2014-10-25 ENCOUNTER — Encounter (HOSPITAL_COMMUNITY)
Admission: RE | Admit: 2014-10-25 | Discharge: 2014-10-25 | Disposition: A | Discharge status: Home / Self Care | Payer: Medicare Other | Source: Skilled Nursing Facility | Attending: Internal Medicine | Admitting: Internal Medicine

## 2014-10-25 LAB — PROTIME-INR
INR: 3.22 — ABNORMAL HIGH (ref 0.00–1.49)
Prothrombin Time: 33.2 seconds — ABNORMAL HIGH (ref 11.6–15.2)

## 2014-10-29 ENCOUNTER — Other Ambulatory Visit (HOSPITAL_COMMUNITY)
Admission: AD | Admit: 2014-10-29 | Discharge: 2014-10-29 | Disposition: A | Discharge status: Home / Self Care | Payer: Medicare Other | Source: Skilled Nursing Facility | Attending: Internal Medicine | Admitting: Internal Medicine

## 2014-10-29 LAB — PROTIME-INR
INR: 2.07 — ABNORMAL HIGH (ref 0.00–1.49)
Prothrombin Time: 23.5 seconds — ABNORMAL HIGH (ref 11.6–15.2)

## 2014-11-01 ENCOUNTER — Encounter (HOSPITAL_COMMUNITY)
Admission: RE | Admit: 2014-11-01 | Discharge: 2014-11-01 | Disposition: A | Discharge status: Home / Self Care | Payer: Medicare Other | Source: Skilled Nursing Facility | Attending: Internal Medicine | Admitting: Internal Medicine

## 2014-11-01 LAB — PROTIME-INR
INR: 1.87 — ABNORMAL HIGH (ref 0.00–1.49)
Prothrombin Time: 21.7 seconds — ABNORMAL HIGH (ref 11.6–15.2)

## 2014-11-02 ENCOUNTER — Encounter: Payer: Self-pay | Admitting: Vascular Surgery

## 2014-11-03 ENCOUNTER — Encounter: Payer: Self-pay | Admitting: Vascular Surgery

## 2014-11-03 ENCOUNTER — Other Ambulatory Visit: Payer: Self-pay | Admitting: *Deleted

## 2014-11-03 ENCOUNTER — Ambulatory Visit (INDEPENDENT_AMBULATORY_CARE_PROVIDER_SITE_OTHER): Payer: Self-pay | Admitting: Vascular Surgery

## 2014-11-03 VITALS — BP 140/82 | HR 80 | Temp 98.1°F | Resp 16 | Ht 69.0 in | Wt 172.0 lb

## 2014-11-03 DIAGNOSIS — I739 Peripheral vascular disease, unspecified: Secondary | ICD-10-CM

## 2014-11-03 DIAGNOSIS — I714 Abdominal aortic aneurysm, without rupture, unspecified: Secondary | ICD-10-CM

## 2014-11-03 DIAGNOSIS — Z48812 Encounter for surgical aftercare following surgery on the circulatory system: Secondary | ICD-10-CM

## 2014-11-03 NOTE — Progress Notes (Signed)
   Patient name: [name] [name] MRN: [id number] DOB: 1940-08-26 Sex: male  REASON FOR VISIT: Follow up after right above-the-knee amputation  HPI: [name] SawyersDavid T Lucchesi is a 74 y.o. male who was seen in consultation by Dr. Myra GianottiBrabham with an ischemic right lower extremity. The limb was not salvageable and he was scheduled for a right above-the-knee amputation. This was performed on 10/07/2014. In addition, he was noted to have a 5.3 cm infrarenal abdominal aortic aneurysm. The patient was markedly debilitated and clearly was not a candidate for repair. He comes in for a one-month follow up visit to have his staples removed from his right AKA. He has no specific complaints.  No current outpatient prescriptions on file.   No current facility-administered medications for this visit.   REVIEW OF SYSTEMS: Arly.Keller[X ] denotes positive finding; [  ] denotes negative finding  CARDIOVASCULAR:  [ ]  chest pain   [ ]  dyspnea on exertion    CONSTITUTIONAL:  [ ]  fever   [ ]  chills  PHYSICAL EXAM: Filed Vitals:   11/03/14 1515 11/03/14 1519  BP: 171/91 140/82  Pulse: 80 80  Temp: 98.1 F (36.7 C)   TempSrc: Oral   Resp: 16   Height: 5\' 9"  (1.753 m)   Weight: 172 lb (78.019 kg)   SpO2: 100%    GENERAL: The patient is a well-nourished male, in no acute distress. The vital signs are documented above. CARDIOVASCULAR: There is a regular rate and rhythm. PULMONARY: There is good air exchange bilaterally without wheezing or rales. ABDOMEN: Soft and nontender. His right AKA is healing nicely and his staples were removed in the office today. He has no wounds on his left foot.  MEDICAL ISSUES:  STATUS POST RIGHT ABOVE-THE-KNEE AMPUTATION: This is healing nicely and his staples were removed in the office today. Currently he does not appear to be a candidate for a prosthesis based on his markedly debilitated state.  ABDOMINAL AORTIC ANEURYSM: He has a known 5.3 cm infrarenal abdominal aortic aneurysm. I've explained that  any normal risk patient we would consider repair at 5.5 cm, however he clearly is not normal risk. I have ordered a follow up ultrasound of his aorta in 6 months and we will also obtain ABIs on the left at that time. We will have him follow up with Dr. Myra GianottiBrabham who initially saw him in consultation.  Davida Falconi S [facility]: [phone]

## 2014-11-04 ENCOUNTER — Non-Acute Institutional Stay (SKILLED_NURSING_FACILITY): Payer: Medicare Other | Admitting: Internal Medicine

## 2014-11-04 ENCOUNTER — Other Ambulatory Visit (HOSPITAL_COMMUNITY)
Admission: AD | Admit: 2014-11-04 | Discharge: 2014-11-04 | Disposition: A | Discharge status: Home / Self Care | Payer: Medicare Other | Source: Skilled Nursing Facility | Attending: Internal Medicine | Admitting: Internal Medicine

## 2014-11-04 DIAGNOSIS — I2699 Other pulmonary embolism without acute cor pulmonale: Secondary | ICD-10-CM | POA: Diagnosis not present

## 2014-11-04 DIAGNOSIS — M79675 Pain in left toe(s): Secondary | ICD-10-CM

## 2014-11-04 DIAGNOSIS — Z89611 Acquired absence of right leg above knee: Secondary | ICD-10-CM | POA: Diagnosis not present

## 2014-11-04 DIAGNOSIS — Z7901 Long term (current) use of anticoagulants: Secondary | ICD-10-CM | POA: Diagnosis not present

## 2014-11-04 DIAGNOSIS — S78111A Complete traumatic amputation at level between right hip and knee, initial encounter: Secondary | ICD-10-CM

## 2014-11-04 LAB — PROTIME-INR
INR: 1.61 — ABNORMAL HIGH (ref 0.00–1.49)
PROTHROMBIN TIME: 19.3 s — AB (ref 11.6–15.2)

## 2014-11-04 NOTE — Progress Notes (Signed)
Patient ID: [name] [name], male   DOB: 18-Jun-1941, 74 y.o.   MRN: [id number]   ,     This is an acute visit  Level care skilled.  Facility [facility].  Chief complaint-acute visit Acute visit secondary to intermittent left toe discomfort  Follow-up anticoagulation management on chronic Coumadin with history of DVT and PE--  History of present illness.  Patient is a pleasant 75 year old male with a very complicated medical history including history of CVA pulmonary embolism on chronic anticoagulation with Coumadin-as well as hypertension and hyperlipidemia and severe peripheral vascular disease.  He presented to the ER with lethargy and found have a UTI septic shock and mottling in the lower extremities-he was transferred to ICU and had avascular surgery evaluation-initially he needed pressors and wasn't stable enough to go to the operating room for limb salvage-he was treated with empiric antibiotics steroids pressors and fluid resuscitation and was stabilized enough to go to the operating room for an above-the-knee amputation on the righ  He actually has done quite well with this and actually saw his vascular surgeon yesterday and got a good report-however the physician did note apparently some complaints of left toe pain and has suggested possibly investigating etiology of gout   Today is not complaining of any acute pain in this area-I am following up on this t.  .  He also continues on Coumadin as noted above He has completed a course of ciprofloxacin with history of sepsis-INR has been somewhat difficult to manage came in originally with an INR over 4--his Coumadin was held for 2 days and this did normalize at which point Coumadin was restarted at 4 mg a day however subsequently rose--c his dose was reduced to alternating 2 and 2-1/2 mg a day-however INR on March 28 had gone a bit low at 1.87 and he was increased to 2-1/2 mg a day-update INR today shows INR actually has gone down  somewhat at 1.61.  Marland Kitchen  There's been no increased bruising or bleeding clinically he appears stable vital signs stable he has no acute complaints although apparently complained of some left foot and toe pain yesterday when he saw the vascular surgeon.     Previous medical history.  History of septic shock secondary to UTI and ischemic limb.  Status post right AKA.  Peripheral arterial disease.  Toxic encephalopathy.  Anemia.  History of pulmonary embolism on chronic anticoagulation.  History of CVA.  Thrombocytopenia.  Abdominal aortic aneurysm.  Hypertension.  Hyperlipidemia.  Family medical social history reviewed per discharge note on 10/11/2014 and admission note on 10/05/2014.  Of note does have a extensive history of tobacco use 55 years-apparently still had been smoking-denies significant alcohol or illicit drug use.  Parents are both deceased-he has 1 sister who is still living in apparently a brother who is deceased.  His responsible party is his daughter.  Medications.  Tylenol as 325 mg 1 or 2 tabs every 4 hours when necessary.  Vicodin 12/07/2023 one tablet every 4 when necessary pain.  B complex vitamins thousand micrograms daily.  Celexa 40 mg daily.  Lotrisone cream twice a day.  Colace 100 mg 2 tabs twice a day.  Vitamin D2 50,000 units once a week.  Flonase 2 sprays in both nostrils daily.  Imdur 30 mg daily.  Pravachol 80 mg daily at bedtime.  Flomax 0.4 mg daily.  Coumadin .  I review of systems this is somewhat limited secondary to patient's relatively a phasic status--nursing staff  has not noted any issues that he is doing well he appears to agree with this does not complain of pain-  Physical exam . Temperature 97.3 pulse 78 respirations 14 blood pressure 148/83-135/75 most recently  In general this is a pleasant elderly male in no distress -- he is alert and responsive --  He does have relative aphasia.  His skin is  warm and dry he does have 2 significant wounds 1 on the right buttocks the other larger wound is on the sacral area--these are followed by wound care as well as Dr. Cordelia Pocheobson--and per nursing  stable-improved   Eyes pupils appear reactive to light sclera and conjunctiva are clear.  Oropharynx mucous membranes are moist he is edentulous.  Chest is clear to auscultation no labored breathing.  Heart is regular rate and rhythm with occasional irregular beat he does not have significant lower extremity edema it was difficult to palpate a pulse on the left foot.  Abdomen is soft nontender positive bowel sounds.  Musculoskeletal moves all extremities at baseline he is status post right above-the-knee amputation surgical site there is not really any sign of infection here it is dry minimally tender to palpation this appears to be quite stable. --Staples were removed yesterday.  I could not really appreciate any acute tenderness edema or erythema of his left great toe or foot--there is slight tenderness to palpation of the left  Great  toe but this does not really appear to be acute pain today-I do not note any deformity  GU cannot really appreciate any suprapubic tenderness  Neurologic he is a phasic relatively but is able to communicate does move all his extremities again at baseline.--And actually speech appears to be somewhat clear and more understandable than on initial admission  Psych somewhat limited secondary to patient's communication difficulties but he appears grossly alert and oriented pleasant and appropriate follows commands without any difficulty.  Labs.  11/04/2014.  INR 1.61.  10/24/2014.  INR 1.87.  10/25/2014-INR 3.22.  10/21/2014.  WBC 5.3 hemoglobin 10.0 platelets 214.    10/20/2014.  INR 2.98.  10/19/2014.  INR 3.53.  WBC 7.6 hemoglobin 9.9 platelets 2:30.  10/15/2014.  Sodium 139 potassium 3.5 BUN 14 creatinine 0.76 at that time INR was  4.07  10/10/2014.  Sodium 136 potassium 3.3 BUN 12 creatinine 0.71.  10/11/2014.  WBC 9.3 hemoglobin 10.3 platelets 138.  10/05/2014 AST 111 otherwise liver function tests within normal limits except albumin of 2.6   Assessment and plan.  #1 history of sepsis multifactorial including UTI as well as infected limb status post amputation right AKA-at this point appears stable in this regards-surgical site right lower extremity appears to be healing unremarkably he has seen vascular surgery   He is receiving Vicodin as needed for pain this will have to be monitored  #2-history of pulmonary embolism on chronic anticoagulation with Coumadin clinically he appears stable --Coumadin management continues to be somewhat of a challenge with frequent readings supratherapeutic-however he is somewhat subtherapeutic currently-will increase his Coumadin 4 mg tonight and recheck this tomorrow to see where we stand-  \#3-left toe discomfort-will check a uric acid level-clinically he does not appear to be in discomfort today but this will have to be monitored.  Of note also will update a CBC a metabolic panel for updated values.  [id number]           .

## 2014-11-05 ENCOUNTER — Encounter (HOSPITAL_COMMUNITY)
Admission: AD | Admit: 2014-11-05 | Discharge: 2014-11-05 | Disposition: A | Discharge status: Home / Self Care | Payer: Medicare Other | Source: Skilled Nursing Facility | Attending: Internal Medicine | Admitting: Internal Medicine

## 2014-11-05 DIAGNOSIS — R69 Illness, unspecified: Secondary | ICD-10-CM | POA: Insufficient documentation

## 2014-11-05 DIAGNOSIS — Z7901 Long term (current) use of anticoagulants: Secondary | ICD-10-CM | POA: Insufficient documentation

## 2014-11-05 LAB — CBC WITH DIFFERENTIAL/PLATELET
BASOS ABS: 0 10*3/uL (ref 0.0–0.1)
Basophils Relative: 0 % (ref 0–1)
EOS PCT: 7 % — AB (ref 0–5)
Eosinophils Absolute: 0.3 10*3/uL (ref 0.0–0.7)
HCT: 36.2 % — ABNORMAL LOW (ref 39.0–52.0)
Hemoglobin: 11.7 g/dL — ABNORMAL LOW (ref 13.0–17.0)
LYMPHS PCT: 45 % (ref 12–46)
Lymphs Abs: 2.1 10*3/uL (ref 0.7–4.0)
MCH: 31.2 pg (ref 26.0–34.0)
MCHC: 32.3 g/dL (ref 30.0–36.0)
MCV: 96.5 fL (ref 78.0–100.0)
Monocytes Absolute: 0.5 10*3/uL (ref 0.1–1.0)
Monocytes Relative: 11 % (ref 3–12)
NEUTROS ABS: 1.8 10*3/uL (ref 1.7–7.7)
Neutrophils Relative %: 37 % — ABNORMAL LOW (ref 43–77)
PLATELETS: 177 10*3/uL (ref 150–400)
RBC: 3.75 MIL/uL — ABNORMAL LOW (ref 4.22–5.81)
RDW: 15.9 % — ABNORMAL HIGH (ref 11.5–15.5)
WBC: 4.7 10*3/uL (ref 4.0–10.5)

## 2014-11-05 LAB — BASIC METABOLIC PANEL
Anion gap: 6 (ref 5–15)
BUN: 11 mg/dL (ref 6–23)
CO2: 24 mmol/L (ref 19–32)
CREATININE: 0.7 mg/dL (ref 0.50–1.35)
Calcium: 8.5 mg/dL (ref 8.4–10.5)
Chloride: 108 mmol/L (ref 96–112)
GFR calc Af Amer: >90 mL/min (ref >90)
GFR calc non Af Amer: >90 mL/min (ref >90)
Glucose, Bld: 85 mg/dL (ref 70–99)
Potassium: 4 mmol/L (ref 3.5–5.1)
Sodium: 138 mmol/L (ref 135–145)

## 2014-11-05 LAB — PROTIME-INR
INR: 1.66 — ABNORMAL HIGH (ref 0.00–1.49)
Prothrombin Time: 19.8 seconds — ABNORMAL HIGH (ref 11.6–15.2)

## 2014-11-05 LAB — URIC ACID: Uric Acid, Serum: 4.2 mg/dL (ref 4.0–7.8)

## 2014-11-06 ENCOUNTER — Emergency Department (HOSPITAL_COMMUNITY)
Admission: EM | Admit: 2014-11-06 | Discharge: 2014-11-06 | Disposition: A | Discharge status: Home / Self Care | Payer: Medicare Other | Attending: Emergency Medicine | Admitting: Emergency Medicine

## 2014-11-06 ENCOUNTER — Emergency Department (HOSPITAL_COMMUNITY): Payer: Medicare Other

## 2014-11-06 ENCOUNTER — Encounter (HOSPITAL_COMMUNITY): Payer: Self-pay | Admitting: *Deleted

## 2014-11-06 ENCOUNTER — Inpatient Hospital Stay
Admission: RE | Admit: 2014-11-06 | Discharge: 2014-11-25 | Disposition: A | Discharge status: Other Acute Inpatient Hospital | Payer: Medicaid Other | Source: Ambulatory Visit | Attending: Internal Medicine | Admitting: Internal Medicine

## 2014-11-06 DIAGNOSIS — Y9389 Activity, other specified: Secondary | ICD-10-CM | POA: Insufficient documentation

## 2014-11-06 DIAGNOSIS — Z720 Tobacco use: Secondary | ICD-10-CM | POA: Insufficient documentation

## 2014-11-06 DIAGNOSIS — Z86711 Personal history of pulmonary embolism: Secondary | ICD-10-CM | POA: Insufficient documentation

## 2014-11-06 DIAGNOSIS — E785 Hyperlipidemia, unspecified: Secondary | ICD-10-CM | POA: Diagnosis not present

## 2014-11-06 DIAGNOSIS — Q660 Congenital talipes equinovarus: Secondary | ICD-10-CM | POA: Insufficient documentation

## 2014-11-06 DIAGNOSIS — I10 Essential (primary) hypertension: Secondary | ICD-10-CM | POA: Diagnosis not present

## 2014-11-06 DIAGNOSIS — Z87440 Personal history of urinary (tract) infections: Secondary | ICD-10-CM | POA: Insufficient documentation

## 2014-11-06 DIAGNOSIS — Z87448 Personal history of other diseases of urinary system: Secondary | ICD-10-CM | POA: Insufficient documentation

## 2014-11-06 DIAGNOSIS — W01198A Fall on same level from slipping, tripping and stumbling with subsequent striking against other object, initial encounter: Secondary | ICD-10-CM | POA: Insufficient documentation

## 2014-11-06 DIAGNOSIS — I2510 Atherosclerotic heart disease of native coronary artery without angina pectoris: Secondary | ICD-10-CM | POA: Diagnosis not present

## 2014-11-06 DIAGNOSIS — S12690A Other displaced fracture of seventh cervical vertebra, initial encounter for closed fracture: Secondary | ICD-10-CM

## 2014-11-06 DIAGNOSIS — S199XXA Unspecified injury of neck, initial encounter: Secondary | ICD-10-CM | POA: Diagnosis present

## 2014-11-06 DIAGNOSIS — S3992XA Unspecified injury of lower back, initial encounter: Secondary | ICD-10-CM | POA: Insufficient documentation

## 2014-11-06 DIAGNOSIS — R51 Headache: Secondary | ICD-10-CM | POA: Diagnosis not present

## 2014-11-06 DIAGNOSIS — E669 Obesity, unspecified: Secondary | ICD-10-CM | POA: Insufficient documentation

## 2014-11-06 DIAGNOSIS — Z79899 Other long term (current) drug therapy: Secondary | ICD-10-CM | POA: Insufficient documentation

## 2014-11-06 DIAGNOSIS — F419 Anxiety disorder, unspecified: Secondary | ICD-10-CM | POA: Diagnosis not present

## 2014-11-06 DIAGNOSIS — I252 Old myocardial infarction: Secondary | ICD-10-CM | POA: Insufficient documentation

## 2014-11-06 DIAGNOSIS — Y998 Other external cause status: Secondary | ICD-10-CM | POA: Diagnosis not present

## 2014-11-06 DIAGNOSIS — Y92129 Unspecified place in nursing home as the place of occurrence of the external cause: Secondary | ICD-10-CM

## 2014-11-06 DIAGNOSIS — Z7951 Long term (current) use of inhaled steroids: Secondary | ICD-10-CM | POA: Diagnosis not present

## 2014-11-06 DIAGNOSIS — S299XXA Unspecified injury of thorax, initial encounter: Secondary | ICD-10-CM | POA: Diagnosis not present

## 2014-11-06 DIAGNOSIS — Z8719 Personal history of other diseases of the digestive system: Secondary | ICD-10-CM | POA: Insufficient documentation

## 2014-11-06 DIAGNOSIS — S0990XA Unspecified injury of head, initial encounter: Secondary | ICD-10-CM | POA: Diagnosis not present

## 2014-11-06 DIAGNOSIS — Z7901 Long term (current) use of anticoagulants: Secondary | ICD-10-CM | POA: Diagnosis not present

## 2014-11-06 DIAGNOSIS — F329 Major depressive disorder, single episode, unspecified: Secondary | ICD-10-CM | POA: Diagnosis not present

## 2014-11-06 DIAGNOSIS — S129XXA Fracture of neck, unspecified, initial encounter: Secondary | ICD-10-CM | POA: Diagnosis not present

## 2014-11-06 DIAGNOSIS — Y92128 Other place in nursing home as the place of occurrence of the external cause: Secondary | ICD-10-CM | POA: Diagnosis not present

## 2014-11-06 DIAGNOSIS — Z8673 Personal history of transient ischemic attack (TIA), and cerebral infarction without residual deficits: Secondary | ICD-10-CM | POA: Insufficient documentation

## 2014-11-06 DIAGNOSIS — S0003XA Contusion of scalp, initial encounter: Secondary | ICD-10-CM

## 2014-11-06 DIAGNOSIS — W19XXXA Unspecified fall, initial encounter: Secondary | ICD-10-CM

## 2014-11-06 DIAGNOSIS — E119 Type 2 diabetes mellitus without complications: Secondary | ICD-10-CM | POA: Insufficient documentation

## 2014-11-06 DIAGNOSIS — S12601A Unspecified nondisplaced fracture of seventh cervical vertebra, initial encounter for closed fracture: Secondary | ICD-10-CM | POA: Diagnosis not present

## 2014-11-06 LAB — PROTIME-INR
INR: 1.75 — ABNORMAL HIGH (ref 0.00–1.49)
Prothrombin Time: 20.6 seconds — ABNORMAL HIGH (ref 11.6–15.2)

## 2014-11-06 NOTE — Discharge Instructions (Signed)
°Emergency Department Resource Guide °1) Find a Doctor and Pay Out of Pocket °Although you won't have to find out who is covered by your insurance plan, it is a good idea to ask around and get recommendations. You will then need to call the office and see if the doctor you have chosen will accept you as a new patient and what types of options they offer for patients who are self-pay. Some doctors offer discounts or will set up payment plans for their patients who do not have insurance, but you will need to ask so you aren't surprised when you get to your appointment. ° °2) Contact Your Local Health Department °Not all health departments have doctors that can see patients for sick visits, but many do, so it is worth a call to see if yours does. If you don't know where your local health department is, you can check in your phone book. The CDC also has a tool to help you locate your state's health department, and many state websites also have listings of all of their local health departments. ° °3) Find a Walk-in Clinic °If your illness is not likely to be very severe or complicated, you may want to try a walk in clinic. These are popping up all over the country in pharmacies, drugstores, and shopping centers. They're usually staffed by nurse practitioners or physician assistants that have been trained to treat common illnesses and complaints. They're usually fairly quick and inexpensive. However, if you have serious medical issues or chronic medical problems, these are probably not your best option. ° °No Primary Care Doctor: °- Call [facility] at  832-8000 - they can help you locate a primary care doctor that  accepts your insurance, provides certain services, etc. °- Physician Referral Service- 1-800-533-3463 ° °Chronic Pain Problems: °Organization         Address  Phone   Notes  °[facility]  (336) 297-2271 Patients need to be referred by their primary care doctor.  ° °Medication  Assistance: °Organization         Address  Phone   Notes  °[place] [facility] [address] °[place], [place] [address] (336) 641-8030 --Must be a resident of [place] °-- Must have NO insurance coverage whatsoever (no Medicaid/ Medicare, etc.) °-- The pt. MUST have a primary care doctor that directs their care regularly and follows them in the community °  °[facility]  (866) 331-1348   °[employer]  (888) 892-1162   ° °Agencies that provide inexpensive medical care: °Organization         Address  Phone   Notes  °[facility]  (336) 832-8035   °[facility]    (336) 832-7272   °[facility] [address] °[place], [place] [address] (336) 832-4777   °[facility] [address], °[place] (336) 271-4999   °[facility]    (336) 373-0678   °[facility]    (336) 272-1050   °[facility] ° [address], [place] Phone:  (336) 832-4444, Fax:  (336) 832-4440 Hours of Operation:  9 am - 6 pm, M-F.  Also accepts Medicaid/Medicare and self-pay.  °[facility] ° [address], [place] Phone: (336) 832-3150, Fax: (336) 832-3151. Hours of Operation:  8:30 am - 5:30 pm, M-F.  Also accepts Medicaid and self-pay.  °[facility] [place] [address], [place] Phone: (336) 878-6027   °[facility] [address], [place], [place] (336)723-1848, Ext. 123 Mondays & Thursdays: 7-9 AM.  First 15 patients are seen on a first come, first serve basis. °  ° °Medicaid-accepting [place] Providers: ° °Organization         Address  Phone   Notes  °[facility] [address], [place] (336) 641-2100 Also accepts self-pay patients.  °[facility] [address], [place] ° (336) 856-9996   °[facility] [address], [place]  (336) 288-8857   °[facility] [address] [place] [address], [place] (336) 299-7000   °Veita Bland [address], [place]  ° (336) 373-1557 Only accepts [place] Access Medicaid patients after they have their name applied to their card.  ° °Self-Pay (no insurance) in [place]: ° °Organization         Address  Phone   Notes  °Sickle Cell Patients, [facility] [address], [place] (336) 832-1970   °[facility] [address], [place] (336) 832-4400   °[facility] ° [address], [place] (336) 992-4800   °[facility]/Dr. Osei-Bonsu ° [address] [place] [address], [place] or [address], [place] (336) 841-8500 Phone number for both [place] and [place] locations is the same.  °[facility] [address], [place] (336) 299-0000   °[facility] [address] [place] [address], [place] or [address] (336) 852-7530 °(336) 878-2260   °[facility] [address], [place] (336) 350-1642, phone; (336) 294-5005, fax Sees patients 1st and 3rd Saturday of every month.  Must not qualify for public or private insurance (i.e. Medicaid, Medicare, [place] Health Choice, Veterans' Benefits) • Household income should be no more than 200% of the poverty level •The clinic cannot treat you if you are pregnant or think you are pregnant • Sexually transmitted diseases are not treated at the clinic.  ° ° °Dental Care: °Organization         Address  Phone  Notes  °[place] [facility] [address], [place] (336) 641-6152 Accepts children up to age 21 who are enrolled in Medicaid or [place] Health Choice; pregnant women with a Medicaid card; and children who have applied for Medicaid or [place] Health Choice, but were declined, whose parents can pay a reduced fee at time of service.  °[place]  [facility] [place]  [address], [place] (336) 641-7733 Accepts children up to age 21 who are enrolled in Medicaid or [place] Health Choice; pregnant women with a Medicaid card; and children who have applied for Medicaid or [place] Health Choice, but were declined, whose parents can pay a reduced fee at time of service.  °[facility] ° [address], [place] (336) 641-4533 Patients are seen by appointment only. Walk-ins are not accepted. [facility] will see patients 18 years of age and older. °Monday - Tuesday (8am-5pm) °Most Wednesdays (8:30-5pm) °$30 per visit, cash only  °[facility] ° [address], [place] (336) 641-4533 Patients are seen by appointment only. Walk-ins are not accepted. [facility] will see patients 18 years of age and older. °One   Wednesday Evening (Monthly: Volunteer Based).  $30 per visit, cash only  °[facility]  (919) 537-3737 for adults; Children under age 4, call Graduate Pediatric Dentistry at ([phone]) [phone]. Children aged 4-14, please call (919) [phone] to request a pediatric application. ° Dental services are provided in all areas of dental care including fillings, crowns and bridges, complete and partial dentures, implants, gum treatment, root canals, and extractions. Preventive care is also provided. Treatment is provided to both adults and children. °Patients are selected via a lottery and there is often a waiting list. °  °[facility] [address], °[place] ° (336) 763-8833 [email or web address] °  °[facility] [address], [place], [place] (336)723-1848, Ext. 123 Second and Fourth Thursday of each month, opens at 6:30 AM; Clinic ends at 9 AM.  Patients are seen on a first-come first-served basis, and a limited number are seen during each clinic.  ° °[facility] ° [address], [place], [place] (336) 723-7904    Eligibility Requirements °You must have lived in [place], [place], or [place] counties for at least the last three months. °  You cannot be eligible for state or federal sponsored healthcare insurance, including Veterans Administration, Medicaid, or Medicare. °  You generally cannot be eligible for healthcare insurance through your employer.  °  How to apply: °Eligibility screenings are held every Tuesday and Wednesday afternoon from 1:00 pm until 4:00 pm. You do not need an appointment for the interview!  °[facility] [address], [place], [place] 336-631-2330   °[facility]  336-342-8273   °[facility]  336-703-3100   °[facility]  336-570-6415   ° °Behavioral Health Resources in the Community: °Intensive Outpatient Programs °Organization         Address  Phone  Notes  °[facility] [address], [place], [place] 336-878-6098   °[facility] [address], [place], [place] 336-832-9800   °[facility] [address], [place], [place] ° 336-882-2125   °[facility] [address],  °[place], [place] 1-800-853-5163 or 336-641-4981   °Substance Abuse Resources °Organization         Address  Phone  Notes  °[facility]  336-882-2125   °[facility]  336-784-9470   °[facility]  336-285-9073   °[facility]  336-845-3988   °Residential & Outpatient Substance Abuse Program  1-800-659-3381   °Psychological Services °Organization         Address  Phone  Notes  °[facility]  336- 832-9600   °[facility]  336- 378-7881   °[facility] [address], [place] 1-800-853-5163 or 336-641-4981   ° °Mobile Crisis Teams °Organization         Address  Phone  Notes  °[facility], Mobile Crisis Care Unit  1-877-626-1772   °Assertive °[facility] ° [address]  [place], [place] 336-834-9664   °Sharon DeEsch [address] °[place] 336-554-5454   ° °Self-Help/Support Groups °Organization         Address  Phone             Notes  °[facility]  - variety of support groups  336- 373-1402 Call for more information  °Narcotics Anonymous (NA), [facility] [address], °[place]  2 meetings at this location  ° °  Residential Treatment Programs Organization         Address  Phone  Notes  [facility] [address],    [place]  1-610-960-45401-(402)027-6486   [facility]  [address], [place] [phone], [place], [place] [phone]) [phone]   [facility] [address], [place] [phone] Admissions: 8am-3pm M-F  [facility] [address]    [place], [place] 295-284-1324(727) [phone]   [facility] [address], [place], [place] [phone]   [facility] [address]  [place], [place] [phone]   [facility] Intensive Outpatient [address], [place], [place] [phone]   [facility] ([facility] [address]  [place], [place] [phone] or (225)[phone](254)[phone]   [facility] ([facility] [address] [place], [place] [phone] Accepts Medicaid  [facility] [address]  [place] [phone] Substance Abuse/Addiction Treatment   [facility]         Address  Phone  Notes  [facility]  (775) 545-8622(888) [phone]   Angie FavaJulie Brannon, PhD [address], [address] [place], [place]   ([phone](336) [phone] or (804) [phone](336) [phone]   [facility]   [address] [place], [place] [phone](336) [phone]   [facility] [address], [place], [place] [phone](336) [phone] Insurance/Medicaid/sponsorship through [facility]  [facility] [address]                                    [place], [place] [phone](336) [phone] Therapy/tele-psych/case    [facility] [address]   [place], [place] ([phone]) [phone]    Dr. Lolly MustacheArfeen  [phone](336) [phone]   [facility] [place]  [employer] [facility] 1) [address], [place] 2) [address], [place] 3)  [address], [place] [phone](336) [phone](336) [phone]  ([phone](336) [phone]   [facility] Child Abuse Hotline 7183201222(336) 6175280835 or (234) [phone](336) ([phone])[phone] (After Hours)      Take your usual prescriptions as previously directed. Wear the cervical collar at all times until you are seen in follow up by the Neurosurgeon. Call your regular medical doctor on Monday to schedule a follow up appointment within the next 2 to 3 days. Call the Neurosurgeon on Monday to schedule a follow up appointment within the next 3 weeks (sooner as needed).  Return to the Emergency Department immediately sooner if worsening.

## 2014-11-06 NOTE — ED Notes (Signed)
Patient and family and caregiver [name] [name] at [facility] verbalize understanding of discharge instructions, home care and follow up care. [facility] back to [facility] at this time.
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
## 2014-11-06 NOTE — ED Notes (Signed)
Pt comes from [facility] by EMS. Pt was being moved from one position to another and fell, hitting his head. Pt has a swollen area to his posterior head with no bleeding at this time. Pt complains of neck pain. Pt placed in c-collar.

## 2014-11-07 ENCOUNTER — Encounter (HOSPITAL_COMMUNITY)
Admission: RE | Admit: 2014-11-07 | Discharge: 2014-11-07 | Disposition: A | Discharge status: Home / Self Care | Payer: Medicare Other | Source: Skilled Nursing Facility | Attending: Internal Medicine | Admitting: Internal Medicine

## 2014-11-07 ENCOUNTER — Encounter: Payer: Self-pay | Admitting: Internal Medicine

## 2014-11-07 DIAGNOSIS — M79676 Pain in unspecified toe(s): Secondary | ICD-10-CM | POA: Insufficient documentation

## 2014-11-07 LAB — PROTIME-INR
INR: 1.89 — ABNORMAL HIGH (ref 0.00–1.49)
PROTHROMBIN TIME: 21.9 s — AB (ref 11.6–15.2)

## 2014-11-08 ENCOUNTER — Non-Acute Institutional Stay (SKILLED_NURSING_FACILITY): Payer: Medicare Other | Admitting: Internal Medicine

## 2014-11-08 DIAGNOSIS — S12690G Other displaced fracture of seventh cervical vertebra, subsequent encounter for fracture with delayed healing: Secondary | ICD-10-CM

## 2014-11-08 NOTE — Progress Notes (Signed)
Patient ID: [name] [name], male   DOB: January 13, 1941, 74 y.o.   MRN: [id number] [facility] pending nursing [facility] Chief complaint review status post trip to [facility] the ER on 4/1. History; this is a patient that I admitted to the facility in March. He had been in the hospital septic secondary largely to a right to lower extremity infected ulceration he ended up having a right AKA. His stump is healing nicely. He has a history of a cerebral artery aneurysm repair and has had a frontal craniotomy. As a result of this he is had significant functional deficits but able to push himself and around the facility in a wheelchair and communicate the although his speech is dysarthric. He is on chronic Coumadin secondary to a history of pulmonary embolism and DVT.  As I understand things he is being transferred in a [place] lift into the wheelchair. I'm not completely sure what happened but he apparently tipped the wheelchair backwards striking the back of his head. Among other things he has a C7 compression fracture on his CT scan with 35% loss of the vertebral height. He is now in a rigid neck collar. He is supposed to follow-up with neurosurgery  .     Study Result       CLINICAL DATA:  Fall, striking head. Focal swelling along the posterior head. Neck pain. History of stroke and diabetes.   EXAM: CT HEAD WITHOUT CONTRAST   CT CERVICAL SPINE WITHOUT CONTRAST   TECHNIQUE: Multidetector CT imaging of the head and cervical spine was performed following the standard protocol without intravenous contrast. Multiplanar CT image reconstructions of the cervical spine were also generated.   COMPARISON:  10/05/2014   FINDINGS: CT HEAD FINDINGS   Metal artifacts along the midline of the anterior inferior falx likely reflecting clips. Bilateral anterior cerebral artery infarcts, chronic.   Periventricular white matter and corona radiata hypodensities favor chronic ischemic microvascular white matter  disease. Remote lacunar infarcts in both lentiform nuclei and in the head of the left caudate. Lacunar infarct in the left periventricular white matter. These appear chronic.   Periventricular white matter and corona radiata hypodensities favor chronic ischemic microvascular white matter disease. Prior frontal craniotomy.   There is complete opacification of the visualized portion of the left maxillary sinus with subtotal opacification of the right maxillary sinus, opacification of multiple ethmoid air cells, and chronic sphenoid and frontal sinusitis.   No intracranial hemorrhage, mass lesion, or acute CVA. Mild scalp soft tissue swelling noted along the posterior vertex. The   CT CERVICAL SPINE FINDINGS   Advanced cervical spondylosis causing osseous foraminal stenosis especially bilaterally at C3-4 and C4-5.   Acute anterior compression fracture of C7 noted with 35% loss of height of the anterior vertebral body. Mild cortical discontinuity extends along the anterior portions of the superior and inferior endplates. No malalignment. New this fractures probably acute. No significant posterior bony retropulsion.   Dilated upper thoracic esophagus.    IMPRESSION 1. Acute 35% anterior wedge compression of the C7 vertebral body, without posterior bony retropulsion or subluxation. 2. Chronic anterior cerebral artery infarcts with chronic scattered lacunar infarcts and chronic microvascular white matter disease, but no acute findings intracranially. 3. Chronic paranasal pansinusitis. I cannot exclude acute sinusitis of the left maxillary sinus ; the visualized portion of the left maxillary sinus is completely opacified but the entire sinus is not included. Prior frontal craniotomy. 4. Scalp soft tissue swelling along the posterior vertex of the head. 5.  Dilated upper thoracic esophagus.   Critical Value/emergent results were called by telephone at the time of interpretation on  11/06/2014 at 7:51 pm to Dr. Samuel JesterKATHLEEN MCMANUS , who verbally acknowledged these results.     Electronically Signed   By: Gaylyn RongWalter  Liebkemann M.D.   On: 11/06/2014 19:51    Physical examination Gen. the patient does not look any different. He is in his neck collar. Respiratory clear entry bilaterally Cardiac heart sounds are normal no murmurs he appears to be euvolemic Abdomen no liver no spleen no tenderness no masses. Neurologic see any major changes here. I still find his speech is very difficult to understand although he does follow directions. Skin; I did not review his wound today however I would like to do this when I'm next in the building in 48 hours. The worst area was the area on his coccyx.  Impression/plan #1 fall while being transferred in a [place] lift. Not exactly certain how this was done. He had apparently made it down into the wheelchair. He suffered a C7 compression fracture without posterior bone retropulsion or subluxation. #2 CT scan also suggested paranasal pansinusitis although she does not appear to be acutely ill from this I doubt there is an acute component. #3 dilated upper thoracic esophagus; I'm not exactly sure how to interpret this. I note that he has thickened liquids, I'm not sure whether he is having swallowing difficulties or not this will require further research #4 history of cerebral artery aneurysm repair as well as anterior cerebral artery infarcts. Building there is a major change here. #5 remains on Coumadin for his history of DVT PE. #6 coccyx and proximal buttock's ulceration I'll need to check into this.    Results for [name] SawyersWILSON, [name] [name] (MRN [id number]) as of 11/08/2014 17:07  Ref. Range 11/01/2014 06:30 11/04/2014 07:50 11/05/2014 07:50 11/06/2014 17:40 11/07/2014 05:00  Sodium Latest Range: 135-145 mmol/L   138    Potassium Latest Range: 3.5-5.1 mmol/L   4.0    Chloride Latest Range: 96-112 mmol/L   108    CO2 Latest Range: 19-32 mmol/L   24    BUN  Latest Range: 6-23 mg/dL   11    Creatinine Latest Range: 0.50-1.35 mg/dL   [id number]    Calcium Latest Range: 8.4-10.5 mg/dL   8.5    GFR calc non Af Amer Latest Range: >90 mL/min   >90    GFR calc Af Amer Latest Range: >90 mL/min   >90    Glucose Latest Range: 70-99 mg/dL   85    Anion gap Latest Range: 5-15    6    Uric Acid, Serum Latest Range: 4.0-7.8 mg/dL   4.2    WBC Latest Range: 4.0-10.5 K/uL   4.7    RBC Latest Range: 4.22-5.81 MIL/uL   3.75 (L)    Hemoglobin Latest Range: 13.0-17.0 g/dL   95.611.7 (L)    HCT Latest Range: 39.0-52.0 %   36.2 (L)    MCV Latest Range: 78.0-100.0 fL   96.5    MCH Latest Range: 26.0-34.0 pg   31.2    MCHC Latest Range: 30.0-36.0 g/dL   21.332.3    RDW Latest Range: 11.5-15.5 %   15.9 (H)    Platelets Latest Range: 150-400 K/uL   177    Neutrophils Relative % Latest Range: 43-77 %   37 (L)    Lymphocytes Relative Latest Range: 12-46 %   45    Monocytes Relative Latest Range: 3-12 %  11    Eosinophils Relative Latest Range: 0-5 %   7 (H)    Basophils Relative Latest Range: 0-1 %   0    NEUT# Latest Range: 1.7-7.7 K/uL   1.8    Lymphocytes Absolute Latest Range: 0.7-4.0 K/uL   2.1    Monocytes Absolute Latest Range: 0.1-1.0 K/uL   0.5    Eosinophils Absolute Latest Range: 0.0-0.7 K/uL   0.3    Basophils Absolute Latest Range: 0.0-0.1 K/uL   0.0    Prothrombin Time Latest Range: 11.6-15.2 seconds 21.7 (H) 19.3 (H) 19.8 (H) 20.6 (H) 21.9 (H)  INR Latest Range: 0.00-1.49  1.87 (H) 1.61 (H) 1.66 (H) 1.75 (H) 1.89 (H)

## 2014-11-09 ENCOUNTER — Encounter: Payer: Self-pay | Admitting: Internal Medicine

## 2014-11-09 ENCOUNTER — Non-Acute Institutional Stay (SKILLED_NURSING_FACILITY): Payer: Medicare Other | Admitting: Internal Medicine

## 2014-11-09 ENCOUNTER — Encounter (HOSPITAL_COMMUNITY)
Admission: AD | Admit: 2014-11-09 | Discharge: 2014-11-09 | Disposition: A | Discharge status: Home / Self Care | Payer: Medicare Other | Source: Skilled Nursing Facility | Attending: Internal Medicine | Admitting: Internal Medicine

## 2014-11-09 DIAGNOSIS — Z7901 Long term (current) use of anticoagulants: Secondary | ICD-10-CM

## 2014-11-09 DIAGNOSIS — R5383 Other fatigue: Secondary | ICD-10-CM | POA: Diagnosis not present

## 2014-11-09 DIAGNOSIS — I2699 Other pulmonary embolism without acute cor pulmonale: Secondary | ICD-10-CM | POA: Diagnosis not present

## 2014-11-09 DIAGNOSIS — S12600D Unspecified displaced fracture of seventh cervical vertebra, subsequent encounter for fracture with routine healing: Secondary | ICD-10-CM

## 2014-11-09 DIAGNOSIS — S12600A Unspecified displaced fracture of seventh cervical vertebra, initial encounter for closed fracture: Secondary | ICD-10-CM | POA: Insufficient documentation

## 2014-11-09 LAB — PROTIME-INR
INR: 2.2 — ABNORMAL HIGH (ref 0.00–1.49)
Prothrombin Time: 24.6 seconds — ABNORMAL HIGH (ref 11.6–15.2)

## 2014-11-09 NOTE — Progress Notes (Signed)
Patient ID: [name] [name], male   DOB: 08/18/1940, 74 y.o.   MRN: [id number]     : [id number]   This is an acute visit  Level care skilled.  Facility [facility].  Chief complaint-acute visit  Follow-up anticoagulation management on chronic Coumadin --lethargy?  History of present illness.  Patient is a pleasant 74 year old male with a very complicated medical history including history of CVA pulmonary embolism on chronic anticoagulation with Coumadin-as well as hypertension and hyperlipidemia and severe peripheral last her disease Status post right above-the-knee amputation.  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  Fairly over the weekend he is being transferred and a Hoyer lift into the wheelchair-somewhat unclear exactly what happened but apparently the wheelchair Backwards and struck the back of his head.  He went to the ER and was diagnosed with a C7 compression fracture on CT scan with 35% loss of vertebral height-he is in a rigid neck collar and is to follow-up with neurosurgery.  Today he appears stable however his daughter is concerned thinking that he is somewhat more lethargic than usual I am following up on this.  His vital signs appear to be stable per nursing he is doing fine today he is currently lying in bed but is bright and alert.  \Managing his Coumadin has been somewhat of a challenge since he tends to go supratherapeutic-however he has been slightly sub therapeutic lately-we have cautiously titrated up his Coumadin he is now on 3 mg a day and appears to have normalized at 2.2 as of today  .    Previous medical history.  History of septic shock secondary to UTI and ischemic limb.  Status post right AKA.  Peripheral arterial disease.  Toxic  encephalopathy.  Anemia.  History of pulmonary embolism on chronic anticoagulation.  History of CVA.  Thrombocytopenia.  Abdominal aortic aneurysm.  Hypertension.  Hyperlipidemia.  Family medical social history reviewed per discharge note on 10/11/2014 and admission note on 10/05/2014.  Of note does have a extensive history of tobacco use 55 years-apparently still had been smoking-denies significant alcohol or illicit drug use.  Parents are both deceased-he has 1 sister who is still living in apparently a brother who is deceased.  His responsible party is his daughter.  Medications.  Tylenol as 325 mg 1 or 2 tabs every 4 hours when necessary.  Vicodin 12/07/2023 one tablet every 4 when necessary pain.  B complex vitamins thousand micrograms daily.  Celexa 40 mg daily.  Lotrisone cream twice a day.  Colace 100 mg 2 tabs twice a day.  Vitamin D2 50,000 units once a week.  Flonase 2 sprays in both nostrils daily.  Imdur 30 mg daily.  Pravachol 80 mg daily at bedtime.  Flomax 0.4 mg daily.  Coumadin 3 mg daily-.  I review of systems this is somewhat limited secondary to patient's relatively a phasic status--but he denies any shortness of breath cough fever or dysuria does not complain of any headache or neck pain for that matter-nursing does not report any issues either  Physical exam . Temperature 97.8 pulse 72 respirations 20 blood pressure 140/90  In general this is a pleasant elderly male in no distress lying comfortably in bed he is alert and responsive --neck collar is in place  He does have relative aphasia.  His  skin is warm and dry he does have 2 significant wounds 1 on the right buttocks the other larger wound is on the sacral area--these are followed by wound care as well as Dr. Leanord Hawkingobson   Eyes pupils appear reactive to light sclera and conjunctiva are clear.  Oropharynx mucous membranes are moist he is edentulous.  Chest is clear to  auscultation no labored breathing.  Heart is regular rate and rhythm with occasional irregular beat he does not have significant lower extremity edema it was difficult to palpate a pulse on the left foot.  Abdomen is soft nontender positive bowel sounds  GU-could not appreciate any suprapubic tenderness.  Musculoskeletal moves all extremities at baseline he is status post right above-the-knee amputation surgical site there is not really any sign of infection here   Neurologic again he is a phasic relatively but is able to communicate does move all his extremities again at baseline.--Strength appears to be intact-speech is somewhat a phasic but if anything improved from initial exam.  Cranial nerves are grossly intact-pupils appear reactive to light visual acuity appears intact extraocular movements intact I do not note any significant lateralizing findings.  His tongue is midline with full range of motion.    Psych somewhat limited secondary to patient's communication difficulties but he appears grossly alert and oriented pleasant and appropriate follows commands without any difficulty is able to tell us family names-the day month year--who the present and is-appears to be quite appropriate and baseline-.  .  Labs.  11/09/2014.  INR 2.2.  11/07/2014 INR 1.89.  April 2 INR 1.75.  11/05/2014.  Sodium 138 potassium 4 BUN 11 creatinine 0.7.  WBC 4.7 hemoglobin 11.7 platelets 177.    10/20/2014.  INR 2.98.  10/19/2014.  INR 3.53.  WBC 7.6 hemoglobin 9.9 platelets 2:30.  10/15/2014.  Sodium 139 potassium 3.5 BUN 14 creatinine 0.76 at that time INR was 4.07  10/10/2014.  Sodium 136 potassium 3.3 BUN 12 creatinine 0.71.  10/11/2014.  WBC 9.3 hemoglobin 10.3 platelets 138.  10/05/2014 AST 111 otherwise liver function tests within normal limits except albumin of 2.6   Assessment and plan.  #1---lethargy?-Patient appears to be bright and alert and had his  baseline-at this point will monitor vital signs appear stable nursing staff has not noted any differences today.  Actually did speak with his daughter later in the evening and she also thought he had been on his baseline this afternoon when she saw him.  #2-history of cervical neck fracture-again he is in a neck collar-he will be followed by neurosurgery-at this point appears stable --d  #3-history of pulmonary embolism on chronic anticoagulation with Coumadin- After being slightly subtherapeutic he is now therapeutic will continue current dose and update INR in a couple days.  [id number]          .

## 2014-11-11 ENCOUNTER — Other Ambulatory Visit (HOSPITAL_COMMUNITY)
Admission: AD | Admit: 2014-11-11 | Discharge: 2014-11-11 | Disposition: A | Discharge status: Home / Self Care | Payer: Medicare Other | Source: Skilled Nursing Facility | Attending: Internal Medicine | Admitting: Internal Medicine

## 2014-11-11 LAB — PROTIME-INR
INR: 1.97 — ABNORMAL HIGH (ref 0.00–1.49)
Prothrombin Time: 22.6 seconds — ABNORMAL HIGH (ref 11.6–15.2)

## 2014-11-12 ENCOUNTER — Encounter (HOSPITAL_COMMUNITY)
Admission: AD | Admit: 2014-11-12 | Discharge: 2014-11-12 | Disposition: A | Discharge status: Home / Self Care | Payer: Medicare Other | Source: Skilled Nursing Facility | Attending: Internal Medicine | Admitting: Internal Medicine

## 2014-11-12 LAB — PROTIME-INR
INR: 1.92 — ABNORMAL HIGH (ref 0.00–1.49)
PROTHROMBIN TIME: 22.1 s — AB (ref 11.6–15.2)

## 2014-11-15 ENCOUNTER — Encounter (HOSPITAL_COMMUNITY)
Admission: RE | Admit: 2014-11-15 | Discharge: 2014-11-15 | Disposition: A | Discharge status: Home / Self Care | Payer: Medicare Other | Source: Skilled Nursing Facility | Attending: Internal Medicine | Admitting: Internal Medicine

## 2014-11-15 LAB — PROTIME-INR
INR: 2.03 — AB (ref 0.00–1.49)
Prothrombin Time: 23.1 seconds — ABNORMAL HIGH (ref 11.6–15.2)

## 2014-11-15 LAB — APTT: APTT: 53 s — AB (ref 24–37)

## 2014-11-16 ENCOUNTER — Other Ambulatory Visit (HOSPITAL_COMMUNITY)
Admission: AD | Admit: 2014-11-16 | Discharge: 2014-11-16 | Disposition: A | Discharge status: Home / Self Care | Payer: Medicare Other | Source: Skilled Nursing Facility | Attending: Internal Medicine | Admitting: Internal Medicine

## 2014-11-16 DIAGNOSIS — R69 Illness, unspecified: Secondary | ICD-10-CM | POA: Insufficient documentation

## 2014-11-16 DIAGNOSIS — Z7901 Long term (current) use of anticoagulants: Secondary | ICD-10-CM | POA: Diagnosis not present

## 2014-11-17 ENCOUNTER — Encounter (HOSPITAL_COMMUNITY)
Admission: RE | Admit: 2014-11-17 | Discharge: 2014-11-17 | Disposition: A | Discharge status: Home / Self Care | Payer: Medicare Other | Source: Ambulatory Visit | Attending: Internal Medicine | Admitting: Internal Medicine

## 2014-11-17 DIAGNOSIS — Z7901 Long term (current) use of anticoagulants: Secondary | ICD-10-CM | POA: Diagnosis not present

## 2014-11-17 DIAGNOSIS — R69 Illness, unspecified: Secondary | ICD-10-CM | POA: Diagnosis not present

## 2014-11-17 LAB — PROTIME-INR
INR: 2.42 — AB (ref 0.00–1.49)
PROTHROMBIN TIME: 26.6 s — AB (ref 11.6–15.2)

## 2014-11-18 DIAGNOSIS — M542 Cervicalgia: Secondary | ICD-10-CM | POA: Diagnosis not present

## 2014-11-19 ENCOUNTER — Other Ambulatory Visit (HOSPITAL_COMMUNITY)
Admission: AD | Admit: 2014-11-19 | Discharge: 2014-11-19 | Disposition: A | Discharge status: Home / Self Care | Payer: Medicare Other | Source: Skilled Nursing Facility | Attending: Internal Medicine | Admitting: Internal Medicine

## 2014-11-19 DIAGNOSIS — R69 Illness, unspecified: Secondary | ICD-10-CM | POA: Insufficient documentation

## 2014-11-19 DIAGNOSIS — Z7901 Long term (current) use of anticoagulants: Secondary | ICD-10-CM | POA: Insufficient documentation

## 2014-11-19 LAB — PROTIME-INR
INR: 2.35 — ABNORMAL HIGH (ref 0.00–1.49)
Prothrombin Time: 25.9 seconds — ABNORMAL HIGH (ref 11.6–15.2)

## 2014-11-22 ENCOUNTER — Encounter (HOSPITAL_COMMUNITY)
Admission: RE | Admit: 2014-11-22 | Discharge: 2014-11-22 | Disposition: A | Discharge status: Home / Self Care | Payer: Medicare Other | Source: Skilled Nursing Facility | Attending: Internal Medicine | Admitting: Internal Medicine

## 2014-11-22 DIAGNOSIS — Z7901 Long term (current) use of anticoagulants: Secondary | ICD-10-CM | POA: Diagnosis not present

## 2014-11-22 DIAGNOSIS — R69 Illness, unspecified: Secondary | ICD-10-CM | POA: Diagnosis not present

## 2014-11-22 LAB — PROTIME-INR
INR: 3.25 — ABNORMAL HIGH (ref 0.00–1.49)
Prothrombin Time: 33.4 seconds — ABNORMAL HIGH (ref 11.6–15.2)

## 2014-11-24 ENCOUNTER — Encounter (HOSPITAL_COMMUNITY)
Admission: RE | Admit: 2014-11-24 | Discharge: 2014-11-24 | Disposition: A | Discharge status: Home / Self Care | Payer: Medicare Other | Source: Ambulatory Visit | Attending: Internal Medicine | Admitting: Internal Medicine

## 2014-11-24 ENCOUNTER — Non-Acute Institutional Stay (SKILLED_NURSING_FACILITY): Payer: Medicare Other | Admitting: Internal Medicine

## 2014-11-24 DIAGNOSIS — L03312 Cellulitis of back [any part except buttock]: Secondary | ICD-10-CM | POA: Diagnosis not present

## 2014-11-24 DIAGNOSIS — L89150 Pressure ulcer of sacral region, unstageable: Secondary | ICD-10-CM | POA: Diagnosis not present

## 2014-11-24 NOTE — Progress Notes (Signed)
Patient ID: [name] SawyersDavid T [name], male   DOB: 05-13-1941, 74 y.o.   MRN: [id number] Facility; [facility] Chief complaint; deteriorating pressure area over his lower sacrum and coccyx History; as is a patient I admitted to the facility in March. He had been septic in the hospital secondary to a right lower extremity infected ulceration and ended up having a right AKA. He has a history of a cerebral artery aneurysm repair and has had a frontal craniotomy in the past and has some degree of communication and functional deficits secondary to this. Was readmitted to hospital on 4/1. He fell while in a [employer] lift. He developed a C7 fracture and hasn't been in a neck brace since then. He is supposed to be following with neurosurgery on this.  During my initial assessment of this man on 10/18/14. It was noted that he had a butterfly distribution wound over his coccyx. This was covered with an eschar. I have not seen this area since then. As I understand things from the nurses things were improving and there was really no open area remaining last week. Apparently the nurses noted deterioration late last week and he has now developed an open area with purulent drainage coming out of an area just in his lower sacral area. I am seeing this today.  Physical examination Gen. the patient does not look systemically unwell. Respiratory; clear air entry bilaterally Cardiac; heart sounds are normal he does not appear to be dehydrated. Skin; there is a dime size shape open area with a, shaped area of skin necrosis extending inferiorly from the open area. There is tenderness and drainage and extensive area of cellulitis in the lower sacrum/coccyx area. Deep culture of this area was done. There is an extensive area of undermining. It is likely he will need some debridement at some point to.  Impression/plan #1 infected decubitus ulcer in the lower sacral area. This has purulent drainage which I have cultured. This will require  parenteral antibiotics and likely an MRI to determine if there is underlying osteomyelitis soft tissue infection. Not seen this wound area and over a month however the history from the nursing staff is that this was improving up to late last week and is simply deteriorated. Most of these areas are gram-negative-type infections at least in my recent experience. I will start with ceftazidine. Blood work will be checked. It is likely he will need an MRI.

## 2014-11-25 ENCOUNTER — Emergency Department (HOSPITAL_COMMUNITY): Payer: Medicare Other

## 2014-11-25 ENCOUNTER — Emergency Department (HOSPITAL_COMMUNITY)
Admission: EM | Admit: 2014-11-25 | Discharge: 2014-11-25 | Disposition: A | Discharge status: Home / Self Care | Payer: Medicare Other | Source: Home / Self Care | Attending: Emergency Medicine | Admitting: Emergency Medicine

## 2014-11-25 ENCOUNTER — Encounter (HOSPITAL_COMMUNITY): Payer: Self-pay | Admitting: *Deleted

## 2014-11-25 ENCOUNTER — Other Ambulatory Visit (HOSPITAL_COMMUNITY)
Admission: AD | Admit: 2014-11-25 | Discharge: 2014-11-25 | Disposition: A | Discharge status: Home / Self Care | Payer: Medicare Other | Source: Skilled Nursing Facility | Attending: Internal Medicine | Admitting: Internal Medicine

## 2014-11-25 ENCOUNTER — Inpatient Hospital Stay
Admission: RE | Admit: 2014-11-25 | Discharge: 2014-11-27 | Disposition: A | Discharge status: Other Acute Inpatient Hospital | Payer: Medicaid Other | Source: Ambulatory Visit | Attending: Internal Medicine | Admitting: Internal Medicine

## 2014-11-25 DIAGNOSIS — Z7901 Long term (current) use of anticoagulants: Secondary | ICD-10-CM | POA: Diagnosis not present

## 2014-11-25 DIAGNOSIS — Y9289 Other specified places as the place of occurrence of the external cause: Secondary | ICD-10-CM

## 2014-11-25 DIAGNOSIS — Z8719 Personal history of other diseases of the digestive system: Secondary | ICD-10-CM | POA: Insufficient documentation

## 2014-11-25 DIAGNOSIS — I10 Essential (primary) hypertension: Secondary | ICD-10-CM | POA: Insufficient documentation

## 2014-11-25 DIAGNOSIS — L89154 Pressure ulcer of sacral region, stage 4: Secondary | ICD-10-CM | POA: Diagnosis present

## 2014-11-25 DIAGNOSIS — N183 Chronic kidney disease, stage 3 (moderate): Secondary | ICD-10-CM | POA: Diagnosis present

## 2014-11-25 DIAGNOSIS — Z86711 Personal history of pulmonary embolism: Secondary | ICD-10-CM

## 2014-11-25 DIAGNOSIS — L8994 Pressure ulcer of unspecified site, stage 4: Secondary | ICD-10-CM | POA: Diagnosis not present

## 2014-11-25 DIAGNOSIS — Z8249 Family history of ischemic heart disease and other diseases of the circulatory system: Secondary | ICD-10-CM

## 2014-11-25 DIAGNOSIS — Q660 Congenital talipes equinovarus: Secondary | ICD-10-CM

## 2014-11-25 DIAGNOSIS — T148 Other injury of unspecified body region: Secondary | ICD-10-CM | POA: Diagnosis not present

## 2014-11-25 DIAGNOSIS — I252 Old myocardial infarction: Secondary | ICD-10-CM

## 2014-11-25 DIAGNOSIS — Z6821 Body mass index (BMI) 21.0-21.9, adult: Secondary | ICD-10-CM | POA: Diagnosis not present

## 2014-11-25 DIAGNOSIS — Z9889 Other specified postprocedural states: Secondary | ICD-10-CM | POA: Diagnosis not present

## 2014-11-25 DIAGNOSIS — L89159 Pressure ulcer of sacral region, unspecified stage: Secondary | ICD-10-CM | POA: Diagnosis not present

## 2014-11-25 DIAGNOSIS — B9562 Methicillin resistant Staphylococcus aureus infection as the cause of diseases classified elsewhere: Secondary | ICD-10-CM | POA: Diagnosis present

## 2014-11-25 DIAGNOSIS — Y9389 Activity, other specified: Secondary | ICD-10-CM | POA: Insufficient documentation

## 2014-11-25 DIAGNOSIS — Z833 Family history of diabetes mellitus: Secondary | ICD-10-CM | POA: Diagnosis not present

## 2014-11-25 DIAGNOSIS — L89150 Pressure ulcer of sacral region, unstageable: Secondary | ICD-10-CM | POA: Diagnosis not present

## 2014-11-25 DIAGNOSIS — Z7951 Long term (current) use of inhaled steroids: Secondary | ICD-10-CM

## 2014-11-25 DIAGNOSIS — G9340 Encephalopathy, unspecified: Secondary | ICD-10-CM | POA: Diagnosis not present

## 2014-11-25 DIAGNOSIS — M1990 Unspecified osteoarthritis, unspecified site: Secondary | ICD-10-CM

## 2014-11-25 DIAGNOSIS — E119 Type 2 diabetes mellitus without complications: Secondary | ICD-10-CM | POA: Diagnosis present

## 2014-11-25 DIAGNOSIS — Z66 Do not resuscitate: Secondary | ICD-10-CM | POA: Diagnosis present

## 2014-11-25 DIAGNOSIS — R279 Unspecified lack of coordination: Secondary | ICD-10-CM | POA: Diagnosis not present

## 2014-11-25 DIAGNOSIS — I671 Cerebral aneurysm, nonruptured: Secondary | ICD-10-CM | POA: Diagnosis not present

## 2014-11-25 DIAGNOSIS — E43 Unspecified severe protein-calorie malnutrition: Secondary | ICD-10-CM | POA: Diagnosis present

## 2014-11-25 DIAGNOSIS — Z8673 Personal history of transient ischemic attack (TIA), and cerebral infarction without residual deficits: Secondary | ICD-10-CM

## 2014-11-25 DIAGNOSIS — N400 Enlarged prostate without lower urinary tract symptoms: Secondary | ICD-10-CM | POA: Diagnosis present

## 2014-11-25 DIAGNOSIS — M546 Pain in thoracic spine: Secondary | ICD-10-CM | POA: Diagnosis not present

## 2014-11-25 DIAGNOSIS — G92 Toxic encephalopathy: Secondary | ICD-10-CM | POA: Diagnosis not present

## 2014-11-25 DIAGNOSIS — S3992XA Unspecified injury of lower back, initial encounter: Secondary | ICD-10-CM

## 2014-11-25 DIAGNOSIS — E669 Obesity, unspecified: Secondary | ICD-10-CM | POA: Insufficient documentation

## 2014-11-25 DIAGNOSIS — M4852XA Collapsed vertebra, not elsewhere classified, cervical region, initial encounter for fracture: Secondary | ICD-10-CM | POA: Diagnosis not present

## 2014-11-25 DIAGNOSIS — Z87440 Personal history of urinary (tract) infections: Secondary | ICD-10-CM

## 2014-11-25 DIAGNOSIS — M159 Polyosteoarthritis, unspecified: Secondary | ICD-10-CM | POA: Diagnosis not present

## 2014-11-25 DIAGNOSIS — I129 Hypertensive chronic kidney disease with stage 1 through stage 4 chronic kidney disease, or unspecified chronic kidney disease: Secondary | ICD-10-CM | POA: Diagnosis present

## 2014-11-25 DIAGNOSIS — Z720 Tobacco use: Secondary | ICD-10-CM | POA: Insufficient documentation

## 2014-11-25 DIAGNOSIS — W19XXXD Unspecified fall, subsequent encounter: Secondary | ICD-10-CM | POA: Diagnosis not present

## 2014-11-25 DIAGNOSIS — K219 Gastro-esophageal reflux disease without esophagitis: Secondary | ICD-10-CM | POA: Diagnosis present

## 2014-11-25 DIAGNOSIS — A419 Sepsis, unspecified organism: Secondary | ICD-10-CM | POA: Diagnosis not present

## 2014-11-25 DIAGNOSIS — E785 Hyperlipidemia, unspecified: Secondary | ICD-10-CM

## 2014-11-25 DIAGNOSIS — Z86718 Personal history of other venous thrombosis and embolism: Secondary | ICD-10-CM

## 2014-11-25 DIAGNOSIS — I723 Aneurysm of iliac artery: Secondary | ICD-10-CM | POA: Diagnosis not present

## 2014-11-25 DIAGNOSIS — W19XXXA Unspecified fall, initial encounter: Secondary | ICD-10-CM

## 2014-11-25 DIAGNOSIS — Z96642 Presence of left artificial hip joint: Secondary | ICD-10-CM | POA: Diagnosis present

## 2014-11-25 DIAGNOSIS — A4902 Methicillin resistant Staphylococcus aureus infection, unspecified site: Secondary | ICD-10-CM | POA: Diagnosis not present

## 2014-11-25 DIAGNOSIS — R29898 Other symptoms and signs involving the musculoskeletal system: Secondary | ICD-10-CM | POA: Diagnosis not present

## 2014-11-25 DIAGNOSIS — W050XXA Fall from non-moving wheelchair, initial encounter: Secondary | ICD-10-CM | POA: Insufficient documentation

## 2014-11-25 DIAGNOSIS — I69351 Hemiplegia and hemiparesis following cerebral infarction affecting right dominant side: Secondary | ICD-10-CM

## 2014-11-25 DIAGNOSIS — M545 Low back pain: Secondary | ICD-10-CM | POA: Diagnosis not present

## 2014-11-25 DIAGNOSIS — R55 Syncope and collapse: Secondary | ICD-10-CM | POA: Diagnosis not present

## 2014-11-25 DIAGNOSIS — S12600D Unspecified displaced fracture of seventh cervical vertebra, subsequent encounter for fracture with routine healing: Secondary | ICD-10-CM | POA: Diagnosis not present

## 2014-11-25 DIAGNOSIS — R52 Pain, unspecified: Principal | ICD-10-CM

## 2014-11-25 DIAGNOSIS — I714 Abdominal aortic aneurysm, without rupture: Secondary | ICD-10-CM | POA: Diagnosis not present

## 2014-11-25 DIAGNOSIS — I2510 Atherosclerotic heart disease of native coronary artery without angina pectoris: Secondary | ICD-10-CM

## 2014-11-25 DIAGNOSIS — Z89611 Acquired absence of right leg above knee: Secondary | ICD-10-CM

## 2014-11-25 DIAGNOSIS — J439 Emphysema, unspecified: Secondary | ICD-10-CM | POA: Diagnosis not present

## 2014-11-25 DIAGNOSIS — I2782 Chronic pulmonary embolism: Secondary | ICD-10-CM | POA: Diagnosis not present

## 2014-11-25 DIAGNOSIS — D72829 Elevated white blood cell count, unspecified: Secondary | ICD-10-CM | POA: Diagnosis not present

## 2014-11-25 DIAGNOSIS — T798XXA Other early complications of trauma, initial encounter: Secondary | ICD-10-CM | POA: Diagnosis not present

## 2014-11-25 DIAGNOSIS — R6521 Severe sepsis with septic shock: Secondary | ICD-10-CM | POA: Diagnosis not present

## 2014-11-25 DIAGNOSIS — J9811 Atelectasis: Secondary | ICD-10-CM | POA: Diagnosis not present

## 2014-11-25 DIAGNOSIS — M549 Dorsalgia, unspecified: Secondary | ICD-10-CM | POA: Diagnosis not present

## 2014-11-25 DIAGNOSIS — Z7401 Bed confinement status: Secondary | ICD-10-CM | POA: Diagnosis not present

## 2014-11-25 DIAGNOSIS — Y998 Other external cause status: Secondary | ICD-10-CM

## 2014-11-25 DIAGNOSIS — I6350 Cerebral infarction due to unspecified occlusion or stenosis of unspecified cerebral artery: Secondary | ICD-10-CM | POA: Diagnosis not present

## 2014-11-25 DIAGNOSIS — M79604 Pain in right leg: Secondary | ICD-10-CM | POA: Diagnosis not present

## 2014-11-25 DIAGNOSIS — Z87448 Personal history of other diseases of urinary system: Secondary | ICD-10-CM | POA: Insufficient documentation

## 2014-11-25 DIAGNOSIS — M5489 Other dorsalgia: Secondary | ICD-10-CM

## 2014-11-25 DIAGNOSIS — F17210 Nicotine dependence, cigarettes, uncomplicated: Secondary | ICD-10-CM | POA: Diagnosis present

## 2014-11-25 DIAGNOSIS — S299XXA Unspecified injury of thorax, initial encounter: Secondary | ICD-10-CM | POA: Diagnosis not present

## 2014-11-25 DIAGNOSIS — T798XXS Other early complications of trauma, sequela: Secondary | ICD-10-CM | POA: Diagnosis not present

## 2014-11-25 LAB — COMPREHENSIVE METABOLIC PANEL
ALBUMIN: 2.3 g/dL — AB (ref 3.5–5.2)
ALK PHOS: 74 U/L (ref 39–117)
ALT: 24 U/L (ref 0–53)
AST: 30 U/L (ref 0–37)
Anion gap: 7 (ref 5–15)
BUN: 23 mg/dL (ref 6–23)
CALCIUM: 8.8 mg/dL (ref 8.4–10.5)
CO2: 26 mmol/L (ref 19–32)
Chloride: 112 mmol/L (ref 96–112)
Creatinine, Ser: 0.77 mg/dL (ref 0.50–1.35)
GFR calc Af Amer: >90 mL/min (ref >90)
GFR calc non Af Amer: 88 mL/min — ABNORMAL LOW (ref >90)
Glucose, Bld: 98 mg/dL (ref 70–99)
POTASSIUM: 3.7 mmol/L (ref 3.5–5.1)
SODIUM: 145 mmol/L (ref 135–145)
Total Bilirubin: 0.3 mg/dL (ref 0.3–1.2)
Total Protein: 7.3 g/dL (ref 6.0–8.3)

## 2014-11-25 LAB — CBC WITH DIFFERENTIAL/PLATELET
BASOS ABS: 0 10*3/uL (ref 0.0–0.1)
Basophils Relative: 0 % (ref 0–1)
EOS ABS: 0.1 10*3/uL (ref 0.0–0.7)
EOS PCT: 1 % (ref 0–5)
HCT: 39.1 % (ref 39.0–52.0)
Hemoglobin: 12.8 g/dL — ABNORMAL LOW (ref 13.0–17.0)
LYMPHS ABS: 2.5 10*3/uL (ref 0.7–4.0)
Lymphocytes Relative: 21 % (ref 12–46)
MCH: 31.1 pg (ref 26.0–34.0)
MCHC: 32.7 g/dL (ref 30.0–36.0)
MCV: 95.1 fL (ref 78.0–100.0)
Monocytes Absolute: 1.3 10*3/uL — ABNORMAL HIGH (ref 0.1–1.0)
Monocytes Relative: 11 % (ref 3–12)
NEUTROS ABS: 8.2 10*3/uL — AB (ref 1.7–7.7)
NEUTROS PCT: 67 % (ref 43–77)
PLATELETS: 283 10*3/uL (ref 150–400)
RBC: 4.11 MIL/uL — ABNORMAL LOW (ref 4.22–5.81)
RDW: 15.7 % — ABNORMAL HIGH (ref 11.5–15.5)
WBC: 12.2 10*3/uL — ABNORMAL HIGH (ref 4.0–10.5)

## 2014-11-25 MED ORDER — ACETAMINOPHEN 500 MG PO TABS
1000.0000 mg | ORAL_TABLET | Freq: Once | ORAL | Status: AC
  Administered 2014-11-25: 1000 mg via ORAL
  Filled 2014-11-25: qty 2

## 2014-11-25 NOTE — Discharge Instructions (Signed)
Back Pain, Adult Low back pain is very common. About 1 in 5 people have back pain.The cause of low back pain is rarely dangerous. The pain often gets better over time.About half of people with a sudden onset of back pain feel better in just 2 weeks. About 8 in 10 people feel better by 6 weeks.  CAUSES Some common causes of back pain include:  Strain of the muscles or ligaments supporting the spine.  Wear and tear (degeneration) of the spinal discs.  Arthritis.  Direct injury to the back. DIAGNOSIS Most of the time, the direct cause of low back pain is not known.However, back pain can be treated effectively even when the exact cause of the pain is unknown.Answering your caregiver's questions about your overall health and symptoms is one of the most accurate ways to make sure the cause of your pain is not dangerous. If your caregiver needs more information, he or she may order lab work or imaging tests (X-rays or MRIs).However, even if imaging tests show changes in your back, this usually does not require surgery. HOME CARE INSTRUCTIONS For many people, back pain returns.Since low back pain is rarely dangerous, it is often a condition that people can learn to [facility] their own.   Remain active. It is stressful on the back to sit or stand in one place. Do not sit, drive, or stand in one place for more than 30 minutes at a time. Take short walks on level surfaces as soon as pain allows.Try to increase the length of time you walk each day.  Do not stay in bed.Resting more than 1 or 2 days can delay your recovery.  Do not avoid exercise or work.Your body is made to move.It is not dangerous to be active, even though your back may hurt.Your back will likely heal faster if you return to being active before your pain is gone.  Pay attention to your body when you bend and lift. Many people have less discomfortwhen lifting if they bend their knees, keep the load close to their bodies,and  avoid twisting. Often, the most comfortable positions are those that put less stress on your recovering back.  Find a comfortable position to sleep. Use a firm mattress and lie on your side with your knees slightly bent. If you lie on your back, put a pillow under your knees.  Only take over-the-counter or prescription medicines as directed by your caregiver. Over-the-counter medicines to reduce pain and inflammation are often the most helpful.Your caregiver may prescribe muscle relaxant drugs.These medicines help dull your pain so you can more quickly return to your normal activities and healthy exercise.  Put ice on the injured area.  Put ice in a plastic bag.  Place a towel between your skin and the bag.  Leave the ice on for 15-20 minutes, 03-04 times a day for the first 2 to 3 days. After that, ice and heat may be alternated to reduce pain and spasms.  Ask your caregiver about trying back exercises and gentle massage. This may be of some benefit.  Avoid feeling anxious or stressed.Stress increases muscle tension and can worsen back pain.It is important to recognize when you are anxious or stressed and learn ways to manage it.Exercise is a great option. SEEK MEDICAL CARE IF:  You have pain that is not relieved with rest or medicine.  You have pain that does not improve in 1 week.  You have new symptoms.  You are generally not feeling well. SEEK  IMMEDIATE MEDICAL CARE IF:   You have pain that radiates from your back into your legs.  You develop new bowel or bladder control problems.  You have unusual weakness or numbness in your arms or legs.  You develop nausea or vomiting.  You develop abdominal pain.  You feel faint. Document Released: 07/23/2005 Document Revised: 01/22/2012 Document Reviewed: 11/24/2013 [facility] Patient Information 2015 [place], [place]. This information is not intended to replace advice given to you by your health care provider. Make sure you  discuss any questions you have with your health care provider.  Fall Prevention and Home Safety Falls cause injuries and can affect all age groups. It is possible to use preventive measures to significantly decrease the likelihood of falls. There are many simple measures which can make your home safer and prevent falls. OUTDOORS  Repair cracks and edges of walkways and driveways.  Remove high doorway thresholds.  Trim shrubbery on the main path into your home.  Have good outside lighting.  Clear walkways of tools, rocks, debris, and clutter.  Check that handrails are not broken and are securely fastened. Both sides of steps should have handrails.  Have leaves, snow, and ice cleared regularly.  Use sand or salt on walkways during winter months.  In the garage, clean up grease or oil spills. BATHROOM  Install night lights.  Install grab bars by the toilet and in the tub and shower.  Use non-skid mats or decals in the tub or shower.  Place a plastic non-slip stool in the shower to sit on, if needed.  Keep floors dry and clean up all water on the floor immediately.  Remove soap buildup in the tub or shower on a regular basis.  Secure bath mats with non-slip, double-sided rug tape.  Remove throw rugs and tripping hazards from the floors. BEDROOMS  Install night lights.  Make sure a bedside light is easy to reach.  Do not use oversized bedding.  Keep a telephone by your bedside.  Have a firm chair with side arms to use for getting dressed.  Remove throw rugs and tripping hazards from the floor. KITCHEN  Keep handles on pots and pans turned toward the center of the stove. Use back burners when possible.  Clean up spills quickly and allow time for drying.  Avoid walking on wet floors.  Avoid hot utensils and knives.  Position shelves so they are not too high or low.  Place commonly used objects within easy reach.  If necessary, use a sturdy step stool with a  grab bar when reaching.  Keep electrical cables out of the way.  Do not use floor polish or wax that makes floors slippery. If you must use wax, use non-skid floor wax.  Remove throw rugs and tripping hazards from the floor. STAIRWAYS  Never leave objects on stairs.  Place handrails on both sides of stairways and use them. Fix any loose handrails. Make sure handrails on both sides of the stairways are as long as the stairs.  Check carpeting to make sure it is firmly attached along stairs. Make repairs to worn or loose carpet promptly.  Avoid placing throw rugs at the top or bottom of stairways, or properly secure the rug with carpet tape to prevent slippage. Get rid of throw rugs, if possible.  Have an electrician put in a light switch at the top and bottom of the stairs. OTHER FALL PREVENTION TIPS  Wear low-heel or rubber-soled shoes that are supportive and fit well.  Wear closed toe shoes.  When using a stepladder, make sure it is fully opened and both spreaders are firmly locked. Do not climb a closed stepladder.  Add color or contrast paint or tape to grab bars and handrails in your home. Place contrasting color strips on first and last steps.  Learn and use mobility aids as needed. Install an electrical emergency response system.  Turn on lights to avoid dark areas. Replace light bulbs that burn out immediately. Get light switches that glow.  Arrange furniture to create clear pathways. Keep furniture in the same place.  Firmly attach carpet with non-skid or double-sided tape.  Eliminate uneven floor surfaces.  Select a carpet pattern that does not visually hide the edge of steps.  Be aware of all pets. OTHER HOME SAFETY TIPS  Set the water temperature for 120 F (48.8 C).  Keep emergency numbers on or near the telephone.  Keep smoke detectors on every level of the home and near sleeping areas. Document Released: 07/13/2002 Document Revised: 01/22/2012 Document  Reviewed: 10/12/2011 [facility] Patient Information 2015 [place], [place]. This information is not intended to replace advice given to you by your health care provider. Make sure you discuss any questions you have with your health care provider.   Possible Head Injury You have received a head injury. It does not appear serious at this time. Headaches and vomiting are common following head injury. It should be easy to awaken from sleeping. Sometimes it is necessary for you to stay in the emergency department for a while for observation. Sometimes admission to the hospital may be needed. After injuries such as yours, most problems occur within the first 24 hours, but side effects may occur up to 7-10 days after the injury. It is important for you to carefully monitor your condition and contact your health care provider or seek immediate medical care if there is a change in your condition. WHAT ARE THE TYPES OF HEAD INJURIES? Head injuries can be as minor as a bump. Some head injuries can be more severe. More severe head injuries include:  A jarring injury to the brain (concussion).  A bruise of the brain (contusion). This mean there is bleeding in the brain that can cause swelling.  A cracked skull (skull fracture).  Bleeding in the brain that collects, clots, and forms a bump (hematoma). WHAT CAUSES A HEAD INJURY? A serious head injury is most likely to happen to someone who is in a car wreck and is not wearing a seat belt. Other causes of major head injuries include bicycle or motorcycle accidents, sports injuries, and falls. HOW ARE HEAD INJURIES DIAGNOSED? A complete history of the event leading to the injury and your current symptoms will be helpful in diagnosing head injuries. Many times, pictures of the brain, such as CT or MRI are needed to see the extent of the injury. Often, an overnight hospital stay is necessary for observation.  WHEN SHOULD I SEEK IMMEDIATE MEDICAL CARE?  You should get  help right away if:  You have confusion or drowsiness.  You feel sick to your stomach (nauseous) or have continued, forceful vomiting.  You have dizziness or unsteadiness that is getting worse.  You have severe, continued headaches not relieved by medicine. Only take over-the-counter or prescription medicines for pain, fever, or discomfort as directed by your health care provider.  You do not have normal function of the arms or legs or are unable to walk.  You notice changes in the black  spots in the center of the colored part of your eye (pupil).  You have a clear or bloody fluid coming from your nose or ears.  You have a loss of vision. During the next 24 hours after the injury, you must stay with someone who can watch you for the warning signs. This person should contact local emergency services (911 in the [place] if you have seizures, you become unconscious, or you are unable to wake up. HOW CAN I PREVENT A HEAD INJURY IN THE FUTURE? The most important factor for preventing major head injuries is avoiding motor vehicle accidents. To minimize the potential for damage to your head, it is crucial to wear seat belts while riding in motor vehicles. Wearing helmets while bike riding and playing collision sports (like football) is also helpful. Also, avoiding dangerous activities around the house will further help reduce your risk of head injury.  WHEN CAN I RETURN TO NORMAL ACTIVITIES AND ATHLETICS? You should be reevaluated by your health care provider before returning to these activities. If you have any of the following symptoms, you should not return to activities or contact sports until 1 week after the symptoms have stopped:  Persistent headache.  Dizziness or vertigo.  Poor attention and concentration.  Confusion.  Memory problems.  Nausea or vomiting.  Fatigue or tire easily.  Irritability.  Intolerant of bright lights or loud noises.  Anxiety or  depression.  Disturbed sleep. MAKE SURE YOU:   Understand these instructions.  Will watch your condition.  Will get help right away if you are not doing well or get worse. Document Released: 07/23/2005 Document Revised: 07/28/2013 Document Reviewed: 03/30/2013 [facility] Patient Information 2015 [place]. This information is not intended to replace advice given to you by your health care provider. Make sure you discuss any questions you have with your health care provider.

## 2014-11-25 NOTE — ED Notes (Signed)
[facility]  [facility].  Staff did not report any injury or alteration in LOC.  Staff reports that pt is at baseline.  Pt did have history of c-7 fracture, so wanted to have evaluation.
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
## 2014-11-27 ENCOUNTER — Encounter (HOSPITAL_COMMUNITY): Payer: Self-pay | Admitting: Emergency Medicine

## 2014-11-27 ENCOUNTER — Emergency Department (HOSPITAL_COMMUNITY): Payer: Medicare Other

## 2014-11-27 ENCOUNTER — Ambulatory Visit (HOSPITAL_COMMUNITY): Payer: Medicare Other

## 2014-11-27 ENCOUNTER — Encounter: Payer: Self-pay | Admitting: Internal Medicine

## 2014-11-27 ENCOUNTER — Inpatient Hospital Stay (HOSPITAL_COMMUNITY)
Admission: EM | Admit: 2014-11-27 | Discharge: 2014-12-03 | DRG: 871 | Disposition: A | Discharge status: Skilled Nursing Facility | Payer: Medicare Other | Attending: Internal Medicine | Admitting: Internal Medicine

## 2014-11-27 ENCOUNTER — Non-Acute Institutional Stay (SKILLED_NURSING_FACILITY): Payer: Medicare Other | Admitting: Internal Medicine

## 2014-11-27 DIAGNOSIS — G92 Toxic encephalopathy: Secondary | ICD-10-CM | POA: Diagnosis not present

## 2014-11-27 DIAGNOSIS — W19XXXD Unspecified fall, subsequent encounter: Secondary | ICD-10-CM | POA: Diagnosis not present

## 2014-11-27 DIAGNOSIS — R52 Pain, unspecified: Secondary | ICD-10-CM

## 2014-11-27 DIAGNOSIS — Z7901 Long term (current) use of anticoagulants: Secondary | ICD-10-CM | POA: Diagnosis not present

## 2014-11-27 DIAGNOSIS — L89150 Pressure ulcer of sacral region, unstageable: Secondary | ICD-10-CM | POA: Diagnosis not present

## 2014-11-27 DIAGNOSIS — R29898 Other symptoms and signs involving the musculoskeletal system: Secondary | ICD-10-CM | POA: Diagnosis not present

## 2014-11-27 DIAGNOSIS — A4902 Methicillin resistant Staphylococcus aureus infection, unspecified site: Secondary | ICD-10-CM | POA: Diagnosis not present

## 2014-11-27 DIAGNOSIS — J439 Emphysema, unspecified: Secondary | ICD-10-CM | POA: Diagnosis not present

## 2014-11-27 DIAGNOSIS — T798XXS Other early complications of trauma, sequela: Secondary | ICD-10-CM | POA: Diagnosis not present

## 2014-11-27 DIAGNOSIS — Z8249 Family history of ischemic heart disease and other diseases of the circulatory system: Secondary | ICD-10-CM | POA: Diagnosis not present

## 2014-11-27 DIAGNOSIS — I714 Abdominal aortic aneurysm, without rupture: Secondary | ICD-10-CM | POA: Diagnosis not present

## 2014-11-27 DIAGNOSIS — I2510 Atherosclerotic heart disease of native coronary artery without angina pectoris: Secondary | ICD-10-CM | POA: Diagnosis present

## 2014-11-27 DIAGNOSIS — M79604 Pain in right leg: Secondary | ICD-10-CM | POA: Diagnosis not present

## 2014-11-27 DIAGNOSIS — Z6821 Body mass index (BMI) 21.0-21.9, adult: Secondary | ICD-10-CM | POA: Diagnosis not present

## 2014-11-27 DIAGNOSIS — R279 Unspecified lack of coordination: Secondary | ICD-10-CM | POA: Diagnosis not present

## 2014-11-27 DIAGNOSIS — Z9889 Other specified postprocedural states: Secondary | ICD-10-CM | POA: Diagnosis not present

## 2014-11-27 DIAGNOSIS — N183 Chronic kidney disease, stage 3 unspecified: Secondary | ICD-10-CM | POA: Diagnosis present

## 2014-11-27 DIAGNOSIS — Z86718 Personal history of other venous thrombosis and embolism: Secondary | ICD-10-CM | POA: Diagnosis not present

## 2014-11-27 DIAGNOSIS — Z96642 Presence of left artificial hip joint: Secondary | ICD-10-CM | POA: Diagnosis present

## 2014-11-27 DIAGNOSIS — F17210 Nicotine dependence, cigarettes, uncomplicated: Secondary | ICD-10-CM | POA: Diagnosis present

## 2014-11-27 DIAGNOSIS — L89159 Pressure ulcer of sacral region, unspecified stage: Secondary | ICD-10-CM | POA: Diagnosis present

## 2014-11-27 DIAGNOSIS — J9811 Atelectasis: Secondary | ICD-10-CM | POA: Diagnosis not present

## 2014-11-27 DIAGNOSIS — D72829 Elevated white blood cell count, unspecified: Secondary | ICD-10-CM

## 2014-11-27 DIAGNOSIS — Z833 Family history of diabetes mellitus: Secondary | ICD-10-CM | POA: Diagnosis not present

## 2014-11-27 DIAGNOSIS — Z86711 Personal history of pulmonary embolism: Secondary | ICD-10-CM | POA: Diagnosis not present

## 2014-11-27 DIAGNOSIS — I129 Hypertensive chronic kidney disease with stage 1 through stage 4 chronic kidney disease, or unspecified chronic kidney disease: Secondary | ICD-10-CM | POA: Diagnosis present

## 2014-11-27 DIAGNOSIS — I723 Aneurysm of iliac artery: Secondary | ICD-10-CM | POA: Diagnosis not present

## 2014-11-27 DIAGNOSIS — E43 Unspecified severe protein-calorie malnutrition: Secondary | ICD-10-CM

## 2014-11-27 DIAGNOSIS — I252 Old myocardial infarction: Secondary | ICD-10-CM | POA: Diagnosis not present

## 2014-11-27 DIAGNOSIS — M79609 Pain in unspecified limb: Secondary | ICD-10-CM | POA: Insufficient documentation

## 2014-11-27 DIAGNOSIS — G894 Chronic pain syndrome: Secondary | ICD-10-CM

## 2014-11-27 DIAGNOSIS — E785 Hyperlipidemia, unspecified: Secondary | ICD-10-CM | POA: Diagnosis present

## 2014-11-27 DIAGNOSIS — B9562 Methicillin resistant Staphylococcus aureus infection as the cause of diseases classified elsewhere: Secondary | ICD-10-CM | POA: Diagnosis present

## 2014-11-27 DIAGNOSIS — I10 Essential (primary) hypertension: Secondary | ICD-10-CM | POA: Diagnosis present

## 2014-11-27 DIAGNOSIS — A419 Sepsis, unspecified organism: Secondary | ICD-10-CM | POA: Diagnosis present

## 2014-11-27 DIAGNOSIS — Z89611 Acquired absence of right leg above knee: Secondary | ICD-10-CM

## 2014-11-27 DIAGNOSIS — Z66 Do not resuscitate: Secondary | ICD-10-CM | POA: Diagnosis present

## 2014-11-27 DIAGNOSIS — L8994 Pressure ulcer of unspecified site, stage 4: Secondary | ICD-10-CM | POA: Diagnosis not present

## 2014-11-27 DIAGNOSIS — I69351 Hemiplegia and hemiparesis following cerebral infarction affecting right dominant side: Secondary | ICD-10-CM | POA: Diagnosis not present

## 2014-11-27 DIAGNOSIS — S12600D Unspecified displaced fracture of seventh cervical vertebra, subsequent encounter for fracture with routine healing: Secondary | ICD-10-CM | POA: Diagnosis not present

## 2014-11-27 DIAGNOSIS — Z87440 Personal history of urinary (tract) infections: Secondary | ICD-10-CM | POA: Diagnosis not present

## 2014-11-27 DIAGNOSIS — S12600A Unspecified displaced fracture of seventh cervical vertebra, initial encounter for closed fracture: Secondary | ICD-10-CM | POA: Diagnosis present

## 2014-11-27 DIAGNOSIS — M729 Fibroblastic disorder, unspecified: Secondary | ICD-10-CM

## 2014-11-27 DIAGNOSIS — T798XXA Other early complications of trauma, initial encounter: Secondary | ICD-10-CM | POA: Diagnosis not present

## 2014-11-27 DIAGNOSIS — I2782 Chronic pulmonary embolism: Secondary | ICD-10-CM | POA: Diagnosis not present

## 2014-11-27 DIAGNOSIS — I671 Cerebral aneurysm, nonruptured: Secondary | ICD-10-CM | POA: Diagnosis not present

## 2014-11-27 DIAGNOSIS — E119 Type 2 diabetes mellitus without complications: Secondary | ICD-10-CM | POA: Diagnosis not present

## 2014-11-27 DIAGNOSIS — K219 Gastro-esophageal reflux disease without esophagitis: Secondary | ICD-10-CM | POA: Diagnosis present

## 2014-11-27 DIAGNOSIS — Z7401 Bed confinement status: Secondary | ICD-10-CM | POA: Diagnosis not present

## 2014-11-27 DIAGNOSIS — Y92129 Unspecified place in nursing home as the place of occurrence of the external cause: Secondary | ICD-10-CM

## 2014-11-27 DIAGNOSIS — M159 Polyosteoarthritis, unspecified: Secondary | ICD-10-CM | POA: Diagnosis not present

## 2014-11-27 DIAGNOSIS — I6350 Cerebral infarction due to unspecified occlusion or stenosis of unspecified cerebral artery: Secondary | ICD-10-CM | POA: Diagnosis not present

## 2014-11-27 DIAGNOSIS — L8990 Pressure ulcer of unspecified site, unspecified stage: Secondary | ICD-10-CM | POA: Diagnosis present

## 2014-11-27 DIAGNOSIS — R6521 Severe sepsis with septic shock: Secondary | ICD-10-CM | POA: Diagnosis not present

## 2014-11-27 DIAGNOSIS — N400 Enlarged prostate without lower urinary tract symptoms: Secondary | ICD-10-CM | POA: Diagnosis present

## 2014-11-27 DIAGNOSIS — G9340 Encephalopathy, unspecified: Secondary | ICD-10-CM | POA: Diagnosis not present

## 2014-11-27 DIAGNOSIS — L89154 Pressure ulcer of sacral region, stage 4: Secondary | ICD-10-CM | POA: Diagnosis not present

## 2014-11-27 DIAGNOSIS — L089 Local infection of the skin and subcutaneous tissue, unspecified: Secondary | ICD-10-CM | POA: Diagnosis present

## 2014-11-27 DIAGNOSIS — T148XXA Other injury of unspecified body region, initial encounter: Secondary | ICD-10-CM

## 2014-11-27 DIAGNOSIS — M545 Low back pain: Secondary | ICD-10-CM | POA: Diagnosis not present

## 2014-11-27 DIAGNOSIS — T797XXA Traumatic subcutaneous emphysema, initial encounter: Secondary | ICD-10-CM | POA: Diagnosis present

## 2014-11-27 LAB — COMPREHENSIVE METABOLIC PANEL
ALBUMIN: 2.2 g/dL — AB (ref 3.5–5.2)
ALK PHOS: 69 U/L (ref 39–117)
ALT: 22 U/L (ref 0–53)
ANION GAP: 7 (ref 5–15)
AST: 30 U/L (ref 0–37)
BILIRUBIN TOTAL: 0.4 mg/dL (ref 0.3–1.2)
BUN: 21 mg/dL (ref 6–23)
CHLORIDE: 108 mmol/L (ref 96–112)
CO2: 28 mmol/L (ref 19–32)
Calcium: 8.8 mg/dL (ref 8.4–10.5)
Creatinine, Ser: 0.91 mg/dL (ref 0.50–1.35)
GFR, EST NON AFRICAN AMERICAN: 82 mL/min — AB (ref >90)
Glucose, Bld: 113 mg/dL — ABNORMAL HIGH (ref 70–99)
POTASSIUM: 3.8 mmol/L (ref 3.5–5.1)
Sodium: 143 mmol/L (ref 135–145)
Total Protein: 7.3 g/dL (ref 6.0–8.3)

## 2014-11-27 LAB — CBC WITH DIFFERENTIAL/PLATELET
BASOS ABS: 0 10*3/uL (ref 0.0–0.1)
Basophils Relative: 0 % (ref 0–1)
EOS ABS: 0.2 10*3/uL (ref 0.0–0.7)
EOS PCT: 1 % (ref 0–5)
HEMATOCRIT: 38.7 % — AB (ref 39.0–52.0)
Hemoglobin: 12.4 g/dL — ABNORMAL LOW (ref 13.0–17.0)
LYMPHS PCT: 20 % (ref 12–46)
Lymphs Abs: 2.2 10*3/uL (ref 0.7–4.0)
MCH: 30.7 pg (ref 26.0–34.0)
MCHC: 32 g/dL (ref 30.0–36.0)
MCV: 95.8 fL (ref 78.0–100.0)
MONO ABS: 1 10*3/uL (ref 0.1–1.0)
Monocytes Relative: 9 % (ref 3–12)
Neutro Abs: 7.6 10*3/uL (ref 1.7–7.7)
Neutrophils Relative %: 70 % (ref 43–77)
PLATELETS: 291 10*3/uL (ref 150–400)
RBC: 4.04 MIL/uL — ABNORMAL LOW (ref 4.22–5.81)
RDW: 16.1 % — AB (ref 11.5–15.5)
WBC: 10.9 10*3/uL — AB (ref 4.0–10.5)

## 2014-11-27 LAB — WOUND CULTURE

## 2014-11-27 LAB — I-STAT CG4 LACTIC ACID, ED: Lactic Acid, Venous: 1.01 mmol/L (ref 0.5–2.0)

## 2014-11-27 LAB — LACTIC ACID, PLASMA: Lactic Acid, Venous: 1.4 mmol/L (ref 0.5–2.0)

## 2014-11-27 MED ORDER — VITAMIN D (ERGOCALCIFEROL) 1.25 MG (50000 UNIT) PO CAPS
50000.0000 [IU] | ORAL_CAPSULE | ORAL | Status: DC
  Filled 2014-11-27: qty 1

## 2014-11-27 MED ORDER — PIPERACILLIN-TAZOBACTAM 3.375 G IVPB
3.3750 g | Freq: Three times a day (TID) | INTRAVENOUS | Status: DC
  Administered 2014-11-28 – 2014-11-30 (×7): 3.375 g via INTRAVENOUS
  Filled 2014-11-27 (×11): qty 50

## 2014-11-27 MED ORDER — OXYCODONE HCL ER 10 MG PO T12A
10.0000 mg | EXTENDED_RELEASE_TABLET | Freq: Two times a day (BID) | ORAL | Status: DC
  Administered 2014-11-28 – 2014-12-03 (×12): 10 mg via ORAL
  Filled 2014-11-27 (×12): qty 1

## 2014-11-27 MED ORDER — PIPERACILLIN-TAZOBACTAM 3.375 G IVPB 30 MIN
3.3750 g | Freq: Once | INTRAVENOUS | Status: AC
  Administered 2014-11-27: 3.375 g via INTRAVENOUS
  Filled 2014-11-27: qty 50

## 2014-11-27 MED ORDER — ONDANSETRON HCL 4 MG/2ML IJ SOLN
4.0000 mg | Freq: Once | INTRAMUSCULAR | Status: AC
  Administered 2014-11-27: 4 mg via INTRAVENOUS
  Filled 2014-11-27: qty 2

## 2014-11-27 MED ORDER — ONDANSETRON HCL 4 MG/2ML IJ SOLN: 4.0000 mg | Freq: Four times a day (QID) | INTRAMUSCULAR | Status: DC | PRN

## 2014-11-27 MED ORDER — VANCOMYCIN HCL 1000 MG IV SOLR
1000.0000 mg | Freq: Two times a day (BID) | INTRAVENOUS | Status: DC
Start: 2014-11-27 — End: 2014-11-27

## 2014-11-27 MED ORDER — MORPHINE SULFATE 4 MG/ML IJ SOLN
4.0000 mg | INTRAMUSCULAR | Status: AC | PRN
  Administered 2014-11-27 – 2014-12-01 (×3): 4 mg via INTRAVENOUS
  Filled 2014-11-27 (×3): qty 1

## 2014-11-27 MED ORDER — ALUM & MAG HYDROXIDE-SIMETH 200-200-20 MG/5ML PO SUSP: 30.0000 mL | Freq: Four times a day (QID) | ORAL | Status: DC | PRN

## 2014-11-27 MED ORDER — PRO-STAT SUGAR FREE PO LIQD
30.0000 mL | Freq: Two times a day (BID) | ORAL | Status: DC
  Administered 2014-11-28 – 2014-12-03 (×12): 30 mL via ORAL
  Filled 2014-11-27 (×12): qty 30

## 2014-11-27 MED ORDER — INSULIN ASPART 100 UNIT/ML ~~LOC~~ SOLN
0.0000 [IU] | SUBCUTANEOUS | Status: DC
  Administered 2014-11-28: 2 [IU] via SUBCUTANEOUS
  Administered 2014-11-29 – 2014-12-03 (×8): 1 [IU] via SUBCUTANEOUS

## 2014-11-27 MED ORDER — VANCOMYCIN HCL 10 G IV SOLR
1500.0000 mg | Freq: Once | INTRAVENOUS | Status: AC
  Administered 2014-11-27: 1500 mg via INTRAVENOUS
  Filled 2014-11-27: qty 1500

## 2014-11-27 MED ORDER — WARFARIN SODIUM 5 MG PO TABS: 2.5000 mg | ORAL_TABLET | Freq: Every day | ORAL | Status: DC

## 2014-11-27 MED ORDER — TAMSULOSIN HCL 0.4 MG PO CAPS
0.4000 mg | ORAL_CAPSULE | Freq: Every day | ORAL | Status: DC
  Administered 2014-11-28 – 2014-12-03 (×6): 0.4 mg via ORAL
  Filled 2014-11-27 (×6): qty 1

## 2014-11-27 MED ORDER — ACETAMINOPHEN 325 MG PO TABS
650.0000 mg | ORAL_TABLET | Freq: Four times a day (QID) | ORAL | Status: DC | PRN
  Administered 2014-11-28: 650 mg via ORAL
  Filled 2014-11-27: qty 2

## 2014-11-27 MED ORDER — ISOSORBIDE MONONITRATE ER 60 MG PO TB24
30.0000 mg | ORAL_TABLET | Freq: Every day | ORAL | Status: DC
  Administered 2014-11-28 – 2014-12-03 (×6): 30 mg via ORAL
  Filled 2014-11-27 (×6): qty 1

## 2014-11-27 MED ORDER — PRAVASTATIN SODIUM 40 MG PO TABS
80.0000 mg | ORAL_TABLET | Freq: Every evening | ORAL | Status: DC
  Administered 2014-11-28 – 2014-12-02 (×5): 80 mg via ORAL
  Filled 2014-11-27 (×5): qty 2

## 2014-11-27 MED ORDER — HYDROMORPHONE HCL 1 MG/ML IJ SOLN
0.5000 mg | INTRAMUSCULAR | Status: DC | PRN
  Administered 2014-11-28 – 2014-12-01 (×8): 1 mg via INTRAVENOUS
  Filled 2014-11-27 (×8): qty 1

## 2014-11-27 MED ORDER — ONDANSETRON HCL 4 MG PO TABS: 4.0000 mg | ORAL_TABLET | Freq: Four times a day (QID) | ORAL | Status: DC | PRN

## 2014-11-27 MED ORDER — ACETAMINOPHEN 650 MG RE SUPP: 650.0000 mg | Freq: Four times a day (QID) | RECTAL | Status: DC | PRN

## 2014-11-27 MED ORDER — OXYCODONE HCL 5 MG PO TABS
5.0000 mg | ORAL_TABLET | ORAL | Status: DC | PRN
  Administered 2014-11-28 – 2014-11-30 (×2): 5 mg via ORAL
  Filled 2014-11-27 (×2): qty 1

## 2014-11-27 MED ORDER — CITALOPRAM HYDROBROMIDE 20 MG PO TABS
20.0000 mg | ORAL_TABLET | Freq: Every day | ORAL | Status: DC
  Administered 2014-11-28 – 2014-12-03 (×7): 20 mg via ORAL
  Filled 2014-11-27 (×6): qty 1

## 2014-11-27 MED ORDER — SODIUM CHLORIDE 0.9 % IV SOLN
INTRAVENOUS | Status: DC
  Administered 2014-11-28 – 2014-12-02 (×8): via INTRAVENOUS
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
## 2014-11-27 NOTE — ED Notes (Addendum)
Pt sent from [name] for Possible Sepsis from Sacral wound infection. [facility] is [name] due to C-7 fracture from previous fall per pt daughter that happened approximately 2 weeks ago.

## 2014-11-27 NOTE — Progress Notes (Signed)
Patient ID: Delrae SawyersDavid T Sargeant, male   DOB: October 09, 1940, 74 y.o.   MRN: [id number]   This is an acute visit.  Level of care skilled.  Facility [facility].  Chief complaint-acute visit follow-up right lower extremity discomfort-follow-up fall from yesterday.  History of present illness.  Patient is a 74 year old male with a complicated medical history-had been in the hospital septic secondary to her right lower extremity infected ulceration ended up having a right AKA.  His stump has healed unremarkably however apparently he's been complaining of some more pain with this the last day or so-his family is concerned however thinking that he's had increased pain in this area-.  He apparently had a fall yesterday as well and went to the ER --they did x-ray his cervical spine as well as thoracic and lumbar spine with no acute injury-he does have a previous history of a C7 compression fracture and is in a neck brace   He also has a history of a sacral wound this is being followed by wound care and Dr. Leanord Hawkingobson he is on [place].  Family medical social history as been reviewed including progress note of 11/08/2014.  [facility] is receiving Norco as needed every 4 hours for pain 1 or 2 tabs.  Review of systems.  Limited secondary to patient's relatively a phasic dysarthric status-he appears to be complaining of some discomfort of his right stump area although appears to be resting fairly comfortablyshe is not complaining of shortness of breath  Or numbness   Physical exam.  Temperature is 98.6 pulse 98 respirations 20 blood pressure 107/50 O2 sats rations 95% on room air.  In general this is a frail elderly male in no distress resting comfortably in bed he is in a neck collar.  His chest is clear to auscultation.  Heart is regular rate and rhythm with occasional skipped beats he does not have significant left lower extremity edema.  His abdomen soft nontender  positive bowel sounds.  Musculoskeletal does have history of left-sided weakness and right mouth droop this appears to be unchanged-I did assess the stump area on the right there is a well-healed surgical scar there is no edema or erythema or sign of infection-possibly some mild tenderness to palpation of the area. I did not note any deformities-cervical neck collar is in place  Neurologic as stated above continues with dysarthric speech  Psych appears to be at baseline he does have dysarthric speech--- he is cooperative with the exam.  Skin he does have a sacral wound currently covered again this is followed by wound care and Dr. Leanord Hawkingobson  Assessment and plan.  11/25/2014.  WBC 12.2 hemoglobin 12.8 platelets 283 sodium 145 potassium 3.7 BUN 23 creatinine 0.77.  Albumin 2.3 otherwise liver function tests within normal limits    Assessment and plan.  #1-right stump pain-will obtain a x-ray in the morning-clinically appears to be stable he is receiving Norco as needed for pain at this point monitor.  #2-history of fall apparently he slid out of the wheelchair again no significant injuries per ER evaluation he appears to be at baseline tonight.  I do note a mildly elevated white count-I suspect this is due to the wound sacral area-he has been started on an antibiotic  We will update this first laboratory day  [phone]

## 2014-11-27 NOTE — Progress Notes (Signed)
This encounter was created in error - please disregard.
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
## 2014-11-28 DIAGNOSIS — L89153 Pressure ulcer of sacral region, stage 3: Secondary | ICD-10-CM

## 2014-11-28 DIAGNOSIS — Z7901 Long term (current) use of anticoagulants: Secondary | ICD-10-CM

## 2014-11-28 DIAGNOSIS — L89159 Pressure ulcer of sacral region, unspecified stage: Secondary | ICD-10-CM | POA: Diagnosis present

## 2014-11-28 DIAGNOSIS — T797XXA Traumatic subcutaneous emphysema, initial encounter: Secondary | ICD-10-CM | POA: Diagnosis present

## 2014-11-28 LAB — GLUCOSE, CAPILLARY
GLUCOSE-CAPILLARY: 96 mg/dL (ref 70–99)
GLUCOSE-CAPILLARY: 85 mg/dL (ref 70–99)
GLUCOSE-CAPILLARY: 97 mg/dL (ref 70–99)
Glucose-Capillary: 111 mg/dL — ABNORMAL HIGH (ref 70–99)
Glucose-Capillary: 153 mg/dL — ABNORMAL HIGH (ref 70–99)
Glucose-Capillary: 99 mg/dL (ref 70–99)

## 2014-11-28 LAB — CBC
HEMATOCRIT: 35.8 % — AB (ref 39.0–52.0)
Hemoglobin: 11.6 g/dL — ABNORMAL LOW (ref 13.0–17.0)
MCH: 31 pg (ref 26.0–34.0)
MCHC: 32.4 g/dL (ref 30.0–36.0)
MCV: 95.7 fL (ref 78.0–100.0)
Platelets: 296 10*3/uL (ref 150–400)
RBC: 3.74 MIL/uL — ABNORMAL LOW (ref 4.22–5.81)
RDW: 16 % — AB (ref 11.5–15.5)
WBC: 12.5 10*3/uL — ABNORMAL HIGH (ref 4.0–10.5)

## 2014-11-28 LAB — PROTIME-INR
INR: 5.88 (ref 0.00–1.49)
PROTHROMBIN TIME: 53.1 s — AB (ref 11.6–15.2)

## 2014-11-28 LAB — MRSA PCR SCREENING: MRSA by PCR: POSITIVE — AB

## 2014-11-28 LAB — BASIC METABOLIC PANEL
Anion gap: 5 (ref 5–15)
BUN: 18 mg/dL (ref 6–23)
CO2: 26 mmol/L (ref 19–32)
Calcium: 8.5 mg/dL (ref 8.4–10.5)
Chloride: 113 mmol/L — ABNORMAL HIGH (ref 96–112)
Creatinine, Ser: 0.86 mg/dL (ref 0.50–1.35)
GFR, EST NON AFRICAN AMERICAN: 84 mL/min — AB (ref >90)
Glucose, Bld: 96 mg/dL (ref 70–99)
Potassium: 3.9 mmol/L (ref 3.5–5.1)
Sodium: 144 mmol/L (ref 135–145)

## 2014-11-28 MED ORDER — WARFARIN - PHYSICIAN DOSING INPATIENT
Freq: Every day | Status: DC
  Administered 2014-11-28: 18:00:00

## 2014-11-28 MED ORDER — CHLORHEXIDINE GLUCONATE CLOTH 2 % EX PADS
6.0000 | MEDICATED_PAD | Freq: Every day | CUTANEOUS | Status: AC
  Administered 2014-11-29 – 2014-12-03 (×5): 6 via TOPICAL

## 2014-11-28 MED ORDER — PIPERACILLIN-TAZOBACTAM 3.375 G IVPB
INTRAVENOUS | Status: AC
  Filled 2014-11-28: qty 50

## 2014-11-28 MED ORDER — ENSURE ENLIVE PO LIQD
237.0000 mL | Freq: Two times a day (BID) | ORAL | Status: DC
  Administered 2014-11-28 – 2014-12-03 (×9): 237 mL via ORAL

## 2014-11-28 MED ORDER — VITAMIN D (ERGOCALCIFEROL) 1.25 MG (50000 UNIT) PO CAPS
50000.0000 [IU] | ORAL_CAPSULE | ORAL | Status: DC
  Administered 2014-11-30: 50000 [IU] via ORAL
  Filled 2014-11-28: qty 1

## 2014-11-28 MED ORDER — VANCOMYCIN HCL IN DEXTROSE 1-5 GM/200ML-% IV SOLN
1000.0000 mg | Freq: Two times a day (BID) | INTRAVENOUS | Status: DC
  Administered 2014-11-28 – 2014-11-30 (×6): 1000 mg via INTRAVENOUS
  Filled 2014-11-28 (×9): qty 200

## 2014-11-28 MED ORDER — MUPIROCIN 2 % EX OINT
1.0000 "application " | TOPICAL_OINTMENT | Freq: Two times a day (BID) | CUTANEOUS | Status: AC
  Administered 2014-11-28 – 2014-12-03 (×10): 1 via NASAL
  Filled 2014-11-28: qty 22

## 2014-11-28 NOTE — Progress Notes (Signed)
Utilization review Completed Criston Chancellor RN BSN   
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
## 2014-11-28 NOTE — Progress Notes (Signed)
CRITICAL VALUE ALERT  Critical value received:  INR 5.88  Date of notification:  11/28/14  Time of notification:  0828  Critical value read back:Yes.    Nurse who received alert:  Dagoberto LigasJessica Harless Molinari, RN  MD notified (1st page):  Valrie HartScott Hall, PharmD made aware.  Time of first page:   MD notified (2nd page):  Time of second page:  Responding MD:    Time MD responded:

## 2014-11-28 NOTE — Progress Notes (Signed)
Pt refused to have foley catheter placed. MD paged and notified of pt's refusal.

## 2014-11-28 NOTE — Consult Note (Signed)
WOC wound consult note Reason for Consult: Stage 4 pressure injury with undermining. Assess and provide suggestions for care. Wound type:Pressure Pressure Ulcer POA: Yes Measurement: 2.5cm x 1.8cm aperture with drop in depth of 2.5cm.  Undermining throughout measures 2.5cm from 5-2 o'clock.  Undermining at 3 o'clock measures 5.5cm.  This corresponds to a surface area of full thickness breakdown adjacent to the wound that measures 1.5cm x 5cm x 0.2cm.  This area is essentially pink with basement cell membrane evident.  Wound bed:It is difficult to see into the cavity wound, but irrigant return is clean and free of frank pus or blood. Drainage (amount, consistency, odor) the old dressing is saturated with serosanguinous drainage, no odor. Periwound:intact without induration, erythema or warmth Dressing procedure/placement/frequency: At this time, I will obliterate the dead space (defect) with twice daily filling with plain gauze packing strip (1/2 inch).  The aperture of the defect is too narrow for packing with any type of gauze square opened and we wold not wish to have a piece of retained gauze in this area.  I do not feel that NPWT is appropriate at this time. Patient's left heel is soft, but not mushy; I will provide guidance for the nursing staff for care and application of emollients, then a pressure redistribution boot to protect it from pressure injury.  Today the patient's daughter and her husband are asking me about HBOT; I will defer them to another physician of your choosing, but did tell them it is an adjunct therapy for some wounds, but not all and that typically those wounds responding best to HBOT are those involving ischemic blood flow particularly in the lower extremities. I will also provide today an order for a thereapeutic mattress with low air loss feature. Additionally, and as is so often the case for wound care consultations, the patient would benefit from a nutritional consult to see  if he is getting the necessary vitamins, minerals and proteins required for tissue repair and regeneration.  If you agree, please order. WOC nursing team will not follow, but will remain available to this patient, the nursing and medical teams.  Please re-consult if needed. Thanks, Ladona MowLaurie Syria Kestner, MSN, RN, GNP, [place], CWON-AP [phone])

## 2014-11-28 NOTE — Progress Notes (Signed)
[facility]                                                                              Patient Demographics  [name] [name], is a 74 y.o. male, DOB - 1941/03/28, ZOX:[id number]  Admit date - 11/27/2014   Admitting Physician Ron Parker, MD  Outpatient Primary MD for the patient is Terald Sleeper, MD  LOS - 1   Chief Complaint  Patient presents with  . Wound Infection      HPI on 11/27/2014 by Dr. Della Goo [name] [name] is a 74 y.o. male with a history of CVA , Dysphagia and Right hemiparesis, HTN DM2, Hyperlipidemia Chronic Anticoagulation due to PE/DVT Recent AKA of RLE, and chronic Sacral Decubitus Ulcer who was sent from the [facility] due to an infection of his Sacral decubitus ulcer after results of his wound culture returned positive for MRSA. A Ct scan of the ABD Pelvis was performed and revealed soft tissue inflammation and gas tracking but no definitive osteomyelitis of the sacral wound. He was started on IV Vancomycin and Zosyn and referred for admission.   Assessment & Plan  Decubitus ulcer/wound infection -Culture was +MRSA -CT pelvis: soft tissue wound on medial aspect of the right buttock, air tracking, no focal osseous erosions to suggest osteomyelitis  -Continue IV Vanc and zosyn -Wound care consulted -Blood culture pending  Subacute C7 cervical fracture -CT cervical spine on 11/06/2014: Acute 35% anterior wedge compression of C7 vertebral body; Repeat CT on 11/25/2014- showed no change; degree of healing -Continue cervical collar  History of DVT/PE -on coumadin, currently supratherapeutic INR -Coumadin per pharmacy, held  Abdominal aortic aneurysm  -Noted on CT= 4.5cm x 4.0cm, needs 6 month follow up CTA -Patient is to continue follow up with vascular surgery  Hyperlipidemia -Continue statin  Diabetes mellitus, type 2 -Continue ISS and CBG monitoring  Chronic kidney disease, stage 3 -Creatinine stable, 0.86  Code Status:  DNR  Family Communication: None at bedside  Disposition Plan: Admitted, continue IV antibiotics, pending wound care eval  Time Spent in minutes   30 minutes  Procedures  None  Consults   Wound care  DVT Prophylaxis  Coumadin  Lab Results  Component Value Date   PLT 296 11/28/2014    Medications  Scheduled Meds: . [START ON 11/29/2014] Chlorhexidine Gluconate Cloth  6 each Topical Q0600  . citalopram  20 mg Oral Daily  . feeding supplement (ENSURE ENLIVE)  237 mL Oral BID BM  . feeding supplement (PRO-STAT SUGAR FREE 64)  30 mL Oral BID BM  . insulin aspart  0-9 Units Subcutaneous 6 times per day  . isosorbide mononitrate  30 mg Oral Daily  . mupirocin ointment  1 application Nasal BID  . OxyCODONE  10 mg Oral Q12H  . piperacillin-tazobactam (ZOSYN)  IV  3.375 g Intravenous 3 times per day  . pravastatin  80 mg Oral QPM  . tamsulosin  0.4 mg Oral Daily  . vancomycin  1,000 mg Intravenous Q12H  . [START ON 11/30/2014] Vitamin D (Ergocalciferol)  50,000 Units Oral Q7 days  . Warfarin - Physician Dosing Inpatient   Does not apply q1800   Continuous  Infusions: . sodium chloride 75 mL/hr at 11/28/14 0025   PRN Meds:.acetaminophen **OR** acetaminophen, alum & mag hydroxide-simeth, HYDROmorphone (DILAUDID) injection, morphine injection, ondansetron **OR** ondansetron (ZOFRAN) IV, oxyCODONE  Antibiotics    Anti-infectives    Start     Dose/Rate Route Frequency Ordered Stop   11/28/14 1000  vancomycin (VANCOCIN) IVPB 1000 mg/200 mL premix     1,000 mg 200 mL/hr over 60 Minutes Intravenous Every 12 hours 11/28/14 0905     11/27/14 2345  vancomycin (VANCOCIN) 1,000 mg in sodium chloride 0.9 % 250 mL IVPB  Status:  Discontinued     1,000 mg 250 mL/hr over 60 Minutes Intravenous Every 12 hours 11/27/14 2335 11/27/14 2346   11/27/14 2230  vancomycin (VANCOCIN) 1,500 mg in sodium chloride 0.9 % 500 mL IVPB     1,500 mg 250 mL/hr over 120 Minutes Intravenous  Once 11/27/14 2214  11/28/14 0051   11/27/14 2200  piperacillin-tazobactam (ZOSYN) IVPB 3.375 g     3.375 g 100 mL/hr over 30 Minutes Intravenous  Once 11/27/14 2146 11/27/14 2301   11/27/14 0600  piperacillin-tazobactam (ZOSYN) IVPB 3.375 g     3.375 g 12.5 mL/hr over 240 Minutes Intravenous 3 times per day 11/27/14 2241          Subjective:   [name] [name] seen and examined today.   Objective:   Filed Vitals:   11/27/14 2000 11/27/14 2012 11/27/14 2326 11/28/14 0616  BP: 142/78  122/76 160/84  Pulse: 95  97 91  Temp:   98.4 F (36.9 C) 98.4 F (36.9 C)  TempSrc:   Oral Oral  Resp:   20 20  Height:  5\' 10"  (1.778 m) 5\' 10"  (1.778 m)   Weight:  78.019 kg (172 lb) 69 kg (152 lb 1.9 oz)   SpO2: 99%  96% 97%    Wt Readings from Last 3 Encounters:  11/27/14 69 kg (152 lb 1.9 oz)  11/03/14 78.019 kg (172 lb)  10/10/14 80.3 kg (177 lb 0.5 oz)     Intake/Output Summary (Last 24 hours) at 11/28/14 1303 Last data filed at 11/28/14 0602  Gross per 24 hour  Intake 1141.25 ml  Output      0 ml  Net 1141.25 ml    Exam  General: Well developed, well nourished, NAD, appears stated age  HEENT: NCAT, PERRLA, EOMI, Anicteic Sclera, mucous membranes moist.   Cardiovascular: S1 S2 auscultated, no rubs, murmurs or gallops. Regular rate and rhythm.  Respiratory: Clear to auscultation bilaterally with equal chest rise  Abdomen: Soft, nontender, nondistended, + bowel sounds  Extremities: RAKA  Neuro: AAO x 2,   Skin: stage 3 wound on the right buttock, some drainage, no bone visible  Psych: Normal affect and demeanor with intact judgement and insight  Data Review   Micro Results Recent Results (from the past 240 hour(s))  Wound culture     Status: None   Collection Time: 11/24/14 10:00 AM  Result Value Ref Range Status   Specimen Description WOUND SACRAL  Final   Special Requests NONE  Final   Gram Stain   Final    ABUNDANT WBC PRESENT,BOTH PMN AND MONONUCLEAR NO SQUAMOUS EPITHELIAL  CELLS SEEN ABUNDANT GRAM POSITIVE COCCI IN PAIRS ABUNDANT GRAM NEGATIVE RODS Performed at [facility]    Culture   Final    RARE METHICILLIN RESISTANT STAPHYLOCOCCUS AUREUS Note: RIFAMPIN AND GENTAMICIN SHOULD NOT BE USED AS SINGLE DRUGS FOR TREATMENT OF STAPH INFECTIONS. This organism is presumed  to be Clindamycin resistant based on detection of inducible Clindamycin resistance. CRITICAL RESULT CALLED TO, READ BACK BY AND  VERIFIED WITH: MICHELLE M. AT 10:30AM ON 11/27/14 HAJAM REPORT FAXED BY REQUEST Performed at [facility]    Report Status 11/27/2014 FINAL  Final   Organism ID, Bacteria METHICILLIN RESISTANT STAPHYLOCOCCUS AUREUS  Final      Susceptibility   Methicillin resistant staphylococcus aureus - MIC*    CLINDAMYCIN RESISTANT      ERYTHROMYCIN >=8 RESISTANT Resistant     GENTAMICIN <=0.5 SENSITIVE Sensitive     LEVOFLOXACIN >=8 RESISTANT Resistant     OXACILLIN >=4 RESISTANT Resistant     PENICILLIN >=0.5 RESISTANT Resistant     RIFAMPIN <=0.5 SENSITIVE Sensitive     TRIMETH/SULFA <=10 SENSITIVE Sensitive     VANCOMYCIN 1 SENSITIVE Sensitive     TETRACYCLINE <=1 SENSITIVE Sensitive     * RARE METHICILLIN RESISTANT STAPHYLOCOCCUS AUREUS  Culture, blood (routine x 2)     Status: None (Preliminary result)   Collection Time: 11/27/14  8:19 PM  Result Value Ref Range Status   Specimen Description BLOOD LEFT ARM  Final   Special Requests BOTTLES DRAWN AEROBIC AND ANAEROBIC 6CC  Final   Culture PENDING  Incomplete   Report Status PENDING  Incomplete  Culture, blood (routine x 2)     Status: None (Preliminary result)   Collection Time: 11/27/14  8:23 PM  Result Value Ref Range Status   Specimen Description BLOOD LEFT HAND  Final   Special Requests BOTTLES DRAWN AEROBIC AND ANAEROBIC 6CC  Final   Culture PENDING  Incomplete   Report Status PENDING  Incomplete  MRSA PCR Screening     Status: Abnormal   Collection Time: 11/28/14  5:55 AM  Result Value Ref  Range Status   MRSA by PCR POSITIVE (A) NEGATIVE Final    Comment:        The GeneXpert MRSA Assay (FDA approved for NASAL specimens only), is one component of a comprehensive MRSA colonization surveillance program. It is not intended to diagnose MRSA infection nor to guide or monitor treatment for MRSA infections. RESULT CALLED TO, READ BACK BY AND VERIFIED WITH: J.MAYS AT 0916 ON 11/28/14 BY S.VANHOORNE     Radiology Reports Dg Chest 1 View  11/27/2014   CLINICAL DATA:  Possible sepsis.  EXAM: CHEST  1 VIEW  COMPARISON:  11/2014.  FINDINGS: Low lung volumes. Cardiac enlargement. Aortic atherosclerosis. Slight vascular congestion. Mild bibasilar atelectasis. No frank infiltrates or overt failure. No visible pneumothorax. Similar appearance to priors.  IMPRESSION: Cardiomegaly.  No active disease.   Electronically Signed   By: Davonna BellingJohn  Curnes M.D.   On: 11/27/2014 21:18   Dg Ribs Unilateral W/chest Right  11/06/2014   CLINICAL DATA:  Larey SeatFell today, striking his head.  EXAM: RIGHT RIBS AND CHEST - 3+ VIEW  COMPARISON:  10/06/2014  FINDINGS: No fracture or other bone lesions are seen involving the ribs. There is no evidence of pneumothorax or pleural effusion. Heart size and mediastinal contours are within normal limits. There is interstitial coarsening, greatest in the periphery of the left base, likely chronic. No acute confluent airspace opacities are evident.  IMPRESSION: Negative for acute displaced fracture, pneumothorax or hemothorax. There is interstitial coarsening, left greater than right.   Electronically Signed   By: Ellery Plunkaniel R Mitchell M.D.   On: 11/06/2014 19:12   Dg Thoracic Spine W/swimmers  11/25/2014   CLINICAL DATA:  Larey SeatFell from bit tonight. Severe back pain.  Recent C7 fracture.  EXAM: THORACIC SPINE - 2 VIEW + SWIMMERS  COMPARISON:  Recent cervical CT studies.  FINDINGS: Alignment is normal. There is ordinary thoracic spondylosis. There is an old compression fracture at T11 No acute  fracture seen in the thoracic spine. C7 fracture shown at previous CT scan not clearly shown because of shoulder density.  IMPRESSION: Chronic degenerative changes. Old compression fracture T11. No evidence of acute thoracic region fracture.   Electronically Signed   By: Paulina Fusi M.D.   On: 11/25/2014 01:57   Dg Lumbar Spine Complete  11/25/2014   CLINICAL DATA:  Fall from bed with severe lower back pain.  EXAM: LUMBAR SPINE - COMPLETE 4+ VIEW  COMPARISON:  11/06/2014  FINDINGS: There is no evidence of fracture or traumatic malalignment. Anterior height loss at T11 and T12 is stable from previous radiography and abdominal CT 10/05/2014.  Bulky osteophytes without proportional disc narrowing, consistent with diffuse idiopathic skeletal hyperostosis. There is osteopenia.  IMPRESSION: 1. No acute osseous findings. 2. Diffuse idiopathic skeletal hyperostosis.   Electronically Signed   By: Marnee Spring M.D.   On: 11/25/2014 01:58   Dg Lumbar Spine Complete  11/06/2014   CLINICAL DATA:  Fall while being moved, striking head.  EXAM: LUMBAR SPINE - COMPLETE 4+ VIEW  COMPARISON:  10/05/2014 CT scan  FINDINGS: Atherosclerosis. Prominent stool throughout the colon favors constipation. Bony demineralization. Bridging anterior spurring suggesting diffuse idiopathic skeletal hyperostosis in the lumbar spine. Minimal anterior wedging at T12 and L1, not appreciably changed from 11/30/2013.  Infrarenal abdominal aortic aneurysm is faintly apparent.  IMPRESSION: 1. No acute fracture or acute subluxation is identified. Flowing spurring suggesting diffuse idiopathic skeletal hyperostosis. 2. Bony demineralization. 3.  Prominent stool throughout the colon favors constipation. 4. Atherosclerosis with infrarenal abdominal aortic aneurysm.   Electronically Signed   By: Gaylyn Rong M.D.   On: 11/06/2014 19:13   Ct Head Wo Contrast  11/25/2014   CLINICAL DATA:  Patient found lying on the floor. Personal history of C7  fracture.  EXAM: CT HEAD WITHOUT CONTRAST  CT CERVICAL SPINE WITHOUT CONTRAST  TECHNIQUE: Multidetector CT imaging of the head and cervical spine was performed following the standard protocol without intravenous contrast. Multiplanar CT image reconstructions of the cervical spine were also generated.  COMPARISON:  11/06/2014 and multiple previous  FINDINGS: CT HEAD FINDINGS  There is chronic bifrontal atrophy E related to previous aneurysm clipping. The brain in general shows atrophy with chronic small vessel change throughout the white matter. No sign of acute infarction, mass lesion, hemorrhage, hydrocephalus or extra-axial collection. No skull fracture. Mucosal inflammatory changes affect the paranasal sinuses. There is atherosclerotic calcification of the major vessels at the base of the brain.  CT CERVICAL SPINE FINDINGS  No change since the previous study. There is a subacute fracture at the C7 level with loss of height anteriorly of 35%. No retropulsed bone. Fracture lines are slightly less distinct. No new fracture. Mild degenerative spondylosis and facet arthropathy, unchanged since before. Mild curvature convex to the right.  IMPRESSION: Head CT: No acute or traumatic finding. Bilateral frontal atrophy related to previous aneurysm clipping. Inflammatory changes of the sinuses.  Cervical spine CT: No acute injury. Subacute compression fracture at C7 with loss of height anteriorly of 35%. Fracture lines are slightly less distinct, indicating there is a degree of healing.   Electronically Signed   By: Paulina Fusi M.D.   On: 11/25/2014 01:46   Ct Head Wo Contrast  11/06/2014   CLINICAL DATA:  Fall, striking head. Focal swelling along the posterior head. Neck pain. History of stroke and diabetes.  EXAM: CT HEAD WITHOUT CONTRAST  CT CERVICAL SPINE WITHOUT CONTRAST  TECHNIQUE: Multidetector CT imaging of the head and cervical spine was performed following the standard protocol without intravenous contrast.  Multiplanar CT image reconstructions of the cervical spine were also generated.  COMPARISON:  10/05/2014  FINDINGS: CT HEAD FINDINGS  Metal artifacts along the midline of the anterior inferior falx likely reflecting clips. Bilateral anterior cerebral artery infarcts, chronic.  Periventricular white matter and corona radiata hypodensities favor chronic ischemic microvascular white matter disease. Remote lacunar infarcts in both lentiform nuclei and in the head of the left caudate. Lacunar infarct in the left periventricular white matter. These appear chronic.  Periventricular white matter and corona radiata hypodensities favor chronic ischemic microvascular white matter disease. Prior frontal craniotomy.  There is complete opacification of the visualized portion of the left maxillary sinus with subtotal opacification of the right maxillary sinus, opacification of multiple ethmoid air cells, and chronic sphenoid and frontal sinusitis.  No intracranial hemorrhage, mass lesion, or acute CVA. Mild scalp soft tissue swelling noted along the posterior vertex. The  CT CERVICAL SPINE FINDINGS  Advanced cervical spondylosis causing osseous foraminal stenosis especially bilaterally at C3-4 and C4-5.  Acute anterior compression fracture of C7 noted with 35% loss of height of the anterior vertebral body. Mild cortical discontinuity extends along the anterior portions of the superior and inferior endplates. No malalignment. New this fractures probably acute. No significant posterior bony retropulsion.  Dilated upper thoracic esophagus.  IMPRESSION: IMPRESSION 1. Acute 35% anterior wedge compression of the C7 vertebral body, without posterior bony retropulsion or subluxation. 2. Chronic anterior cerebral artery infarcts with chronic scattered lacunar infarcts and chronic microvascular white matter disease, but no acute findings intracranially. 3. Chronic paranasal pansinusitis. I cannot exclude acute sinusitis of the left  maxillary sinus ; the visualized portion of the left maxillary sinus is completely opacified but the entire sinus is not included. Prior frontal craniotomy. 4. Scalp soft tissue swelling along the posterior vertex of the head. 5. Dilated upper thoracic esophagus.  Critical Value/emergent results were called by telephone at the time of interpretation on 11/06/2014 at 7:51 pm to Dr. Samuel Jester , who verbally acknowledged these results.   Electronically Signed   By: Gaylyn Rong M.D.   On: 11/06/2014 19:51   Ct Cervical Spine Wo Contrast  11/25/2014   CLINICAL DATA:  Patient found lying on the floor. Personal history of C7 fracture.  EXAM: CT HEAD WITHOUT CONTRAST  CT CERVICAL SPINE WITHOUT CONTRAST  TECHNIQUE: Multidetector CT imaging of the head and cervical spine was performed following the standard protocol without intravenous contrast. Multiplanar CT image reconstructions of the cervical spine were also generated.  COMPARISON:  11/06/2014 and multiple previous  FINDINGS: CT HEAD FINDINGS  There is chronic bifrontal atrophy E related to previous aneurysm clipping. The brain in general shows atrophy with chronic small vessel change throughout the white matter. No sign of acute infarction, mass lesion, hemorrhage, hydrocephalus or extra-axial collection. No skull fracture. Mucosal inflammatory changes affect the paranasal sinuses. There is atherosclerotic calcification of the major vessels at the base of the brain.  CT CERVICAL SPINE FINDINGS  No change since the previous study. There is a subacute fracture at the C7 level with loss of height anteriorly of 35%. No retropulsed bone. Fracture lines are slightly less distinct. No new fracture. Mild  degenerative spondylosis and facet arthropathy, unchanged since before. Mild curvature convex to the right.  IMPRESSION: Head CT: No acute or traumatic finding. Bilateral frontal atrophy related to previous aneurysm clipping. Inflammatory changes of the sinuses.   Cervical spine CT: No acute injury. Subacute compression fracture at C7 with loss of height anteriorly of 35%. Fracture lines are slightly less distinct, indicating there is a degree of healing.   Electronically Signed   By: Paulina Fusi M.D.   On: 11/25/2014 01:46   Ct Cervical Spine Wo Contrast  11/06/2014   CLINICAL DATA:  Fall, striking head. Focal swelling along the posterior head. Neck pain. History of stroke and diabetes.  EXAM: CT HEAD WITHOUT CONTRAST  CT CERVICAL SPINE WITHOUT CONTRAST  TECHNIQUE: Multidetector CT imaging of the head and cervical spine was performed following the standard protocol without intravenous contrast. Multiplanar CT image reconstructions of the cervical spine were also generated.  COMPARISON:  10/05/2014  FINDINGS: CT HEAD FINDINGS  Metal artifacts along the midline of the anterior inferior falx likely reflecting clips. Bilateral anterior cerebral artery infarcts, chronic.  Periventricular white matter and corona radiata hypodensities favor chronic ischemic microvascular white matter disease. Remote lacunar infarcts in both lentiform nuclei and in the head of the left caudate. Lacunar infarct in the left periventricular white matter. These appear chronic.  Periventricular white matter and corona radiata hypodensities favor chronic ischemic microvascular white matter disease. Prior frontal craniotomy.  There is complete opacification of the visualized portion of the left maxillary sinus with subtotal opacification of the right maxillary sinus, opacification of multiple ethmoid air cells, and chronic sphenoid and frontal sinusitis.  No intracranial hemorrhage, mass lesion, or acute CVA. Mild scalp soft tissue swelling noted along the posterior vertex. The  CT CERVICAL SPINE FINDINGS  Advanced cervical spondylosis causing osseous foraminal stenosis especially bilaterally at C3-4 and C4-5.  Acute anterior compression fracture of C7 noted with 35% loss of height of the anterior  vertebral body. Mild cortical discontinuity extends along the anterior portions of the superior and inferior endplates. No malalignment. New this fractures probably acute. No significant posterior bony retropulsion.  Dilated upper thoracic esophagus.  IMPRESSION: IMPRESSION 1. Acute 35% anterior wedge compression of the C7 vertebral body, without posterior bony retropulsion or subluxation. 2. Chronic anterior cerebral artery infarcts with chronic scattered lacunar infarcts and chronic microvascular white matter disease, but no acute findings intracranially. 3. Chronic paranasal pansinusitis. I cannot exclude acute sinusitis of the left maxillary sinus ; the visualized portion of the left maxillary sinus is completely opacified but the entire sinus is not included. Prior frontal craniotomy. 4. Scalp soft tissue swelling along the posterior vertex of the head. 5. Dilated upper thoracic esophagus.  Critical Value/emergent results were called by telephone at the time of interpretation on 11/06/2014 at 7:51 pm to Dr. Samuel Jester , who verbally acknowledged these results.   Electronically Signed   By: Gaylyn Rong M.D.   On: 11/06/2014 19:51   Ct Lumbar Spine Wo Contrast  11/27/2014   CLINICAL DATA:  Low back pain.  EXAM: CT LUMBAR SPINE WITHOUT CONTRAST  TECHNIQUE: Multidetector CT imaging of the lumbar spine was performed without intravenous contrast administration. Multiplanar CT image reconstructions were also generated.  COMPARISON:  Lumbar radiographs 11/25/2014.  FINDINGS: Severe osseous demineralization. Anatomic alignment. Grossly preserved intervertebral disc spaces. Prominent anterior osteophytosis consistent with DISH. No definite acute compression deformity or worrisome osseous lesion. Marked posterior spinous process spurring. No endplate destruction to suggest osteomyelitis. No paravertebral  inflammatory process. No psoas abscess.  Abdominal aortic aneurysm formation. Transverse aorta  measurement opposite L3 is 47 mm. Large RIGHT iliac artery aneurysm. Measurement opposite L5-S1 cross-section 51 x 46 mm.  Sacrum and pelvis findings described separately.  IMPRESSION: No acute lumbar spine abnormality.  Severe osseous demineralization.  Abdominal aortic aneurysm formation and RIGHT iliac for aneurysm formation.  Recommend followup by abdomen and pelvis CTA in 6 months, and vascular surgery referral/consultation if not already obtained. This recommendation follows ACR consensus guidelines: White Paper of the ACR Incidental Findings Committee II on Vascular Findings. J Am Coll Radiol 2013; 10:789-794.   Electronically Signed   By: Davonna Belling M.D.   On: 11/27/2014 21:15   Ct Pelvis Wo Contrast  11/27/2014   CLINICAL DATA:  Sacral wound infection. Possible sepsis. Lower back pain, acute onset. Initial encounter.  EXAM: CT PELVIS WITHOUT CONTRAST  TECHNIQUE: Multidetector CT imaging of the pelvis was performed following the standard protocol without intravenous contrast.  COMPARISON:  CT of the abdomen and pelvis from 10/05/2014  FINDINGS: There is a prominent soft tissue wound at the medial aspect of the right buttock, with significant soft tissue air tracking about the right gluteus musculature and adjacent soft tissues, extending inferiorly along the posterior proximal right thigh. Associated diffuse soft tissue inflammation is seen, without a defined abscess. Necrotizing fasciitis cannot be excluded, given the appearance of the air.  Additional soft tissue inflammation and mild soft tissue air extends posterior to the sacrum and coccyx. Mild soft tissue inflammation extends superiorly along the right flank and mid back.  No focal osseous erosion is seen to suggest osteomyelitis at this time. The hip joints are unremarkable in appearance. The patient's right femoral intramedullary rod is grossly unremarkable; the patient is status post right above the knee amputation. The left-sided dynamic hip  screw is grossly unremarkable in appearance. The pubic symphysis is unremarkable. The sacroiliac joints are within normal limits. Chronic osseous defect is noted at the right iliac wing.  The patient's abdominal aortic aneurysm is again noted, measuring 4.5 cm in transverse dimension and 4.0 cm in AP dimension at the level of the aortic bifurcation, with marked dilatation of the right common iliac artery, measuring up to 4.7 cm in diameter. Visualized small and large bowel loops are grossly unremarkable. The rectum is partially filled with stool. The bladder is largely decompressed. The scrotum is grossly unremarkable.  IMPRESSION: 1. Prominent soft tissue wound at the medial aspect of the right buttock, with significant soft tissue air tracking about the right gluteus musculature and adjacent soft tissues, extending inferiorly along the posterior proximal right thigh. Associated diffuse soft tissue inflammation, without a defined abscess. Given the appearance of the air, necrotizing fasciitis is a concern. 2. Additional soft tissue inflammation and mild soft tissue air extends posterior to the sacrum and coccyx. Mild soft tissue inflammation extends superiorly along the right flank and mid back. 3. No focal osseous erosions seen to suggest osteomyelitis at this time, though evaluation for osteomyelitis is mildly limited on CT. 4. Visualized bilateral femoral hardware is grossly unremarkable in appearance. 5. Abdominal aortic aneurysm again noted, measuring 4.5 cm in transverse dimension and 4.0 cm in AP dimension at the level of the aortic bifurcation, with marked dilatation of the right common iliac artery to 4.7 cm in diameter. Recommend followup by abdomen and pelvis CTA in 6 months, and vascular surgery referral/consultation if not already obtained. This recommendation follows ACR consensus guidelines: White Paper of the ACR  Incidental Findings Committee II on Vascular Findings. J Am Coll Radiol 2013;  10:789-794.  These results were called by telephone at the time of interpretation on 11/27/2014 at 9:19 pm to Dr. Rolland Porter, who verbally acknowledged these results.   Electronically Signed   By: Roanna Raider M.D.   On: 11/27/2014 21:19   Dg Hip Unilat With Pelvis 2-3 Views Right  11/27/2014   CLINICAL DATA:  Right leg stump evaluation  EXAM: RIGHT HIP (WITH PELVIS) 2-3 VIEWS  COMPARISON:  None.  FINDINGS: There is no AP pelvis in this study limiting the exam. The right lower extremity has been amputated in the proximal femur diaphysis. There is a metal intra medullary rod that has also been partially cut. The metallic rod ends at the distal limit of the existing femur. No acute fracture. No dislocation. No destructive bone lesion. Heterotopic calcification is present in the distal stump soft tissues.  There is gas within the soft tissues over the right thigh. This is of unknown significance.  IMPRESSION: Status post amputation above the knee. No obvious destructive bone lesion.  There is gas in the soft tissues in the medial right thigh. Differential diagnosis includes herniated bowel or infection by gas-forming organism.   Electronically Signed   By: Jolaine Click M.D.   On: 11/27/2014 16:19    CBC  Recent Labs Lab 11/25/14 0800 11/27/14 2005 11/28/14 0516  WBC 12.2* 10.9* 12.5*  HGB 12.8* 12.4* 11.6*  HCT 39.1 38.7* 35.8*  PLT 283 291 296  MCV 95.1 95.8 95.7  MCH 31.1 30.7 31.0  MCHC 32.7 32.0 32.4  RDW 15.7* 16.1* 16.0*  LYMPHSABS 2.5 2.2  --   MONOABS 1.3* 1.0  --   EOSABS 0.1 0.2  --   BASOSABS 0.0 0.0  --     Chemistries   Recent Labs Lab 11/25/14 0800 11/27/14 2005 11/28/14 0516  NA 145 143 144  K 3.7 3.8 3.9  CL 112 108 113*  CO2 GLUCOSE 98 113* 96  BUN CREATININE 0.77 0.91 0.86  CALCIUM 8.8 8.8 8.5  AST 30 30  --   ALT 24 22  --   ALKPHOS 74 69  --   BILITOT 0.3 0.4  --     ------------------------------------------------------------------------------------------------------------------ estimated creatinine clearance is 74.7 mL/min (by C-G formula based on Cr of 0.86). ------------------------------------------------------------------------------------------------------------------ No results for input(s): HGBA1C in the last 72 hours. ------------------------------------------------------------------------------------------------------------------ No results for input(s): CHOL, HDL, LDLCALC, TRIG, CHOLHDL, LDLDIRECT in the last 72 hours. ------------------------------------------------------------------------------------------------------------------ No results for input(s): TSH, T4TOTAL, T3FREE, THYROIDAB in the last 72 hours.  Invalid input(s): FREET3 ------------------------------------------------------------------------------------------------------------------ No results for input(s): VITAMINB12, FOLATE, FERRITIN, TIBC, IRON, RETICCTPCT in the last 72 hours.  Coagulation profile  Recent Labs Lab 11/22/14 0820 11/28/14 0827  INR 3.25* 5.88*    No results for input(s): DDIMER in the last 72 hours.  Cardiac Enzymes No results for input(s): CKMB, TROPONINI, MYOGLOBIN in the last 168 hours.  Invalid input(s): CK ------------------------------------------------------------------------------------------------------------------ Invalid input(s): POCBNP    Jarek Longton D.O. on 11/28/2014 at 1:03 PM  Between 7am to 7pm - Pager - [phone]  After 7pm go to [email or web address] - password [email or web address]  And look for the night coverage person covering for me after hours  [facility] Office  [phone]

## 2014-11-28 NOTE — Progress Notes (Signed)
CRITICAL VALUE ALERT  Critical value received:  MRSA Positive  Date of notification:  11/28/14  Time of notification:   0945  Critical value read back:Yes.    Nurse who received alert:  Dagoberto LigasJessica Deneshia Zucker, RN  MD notified (1st page):  Dr. Catha GosselinMikhail  Time of first page:  1000  MD notified (2nd page):  Time of second page:  Responding MD:    Time MD responded:

## 2014-11-29 DIAGNOSIS — A419 Sepsis, unspecified organism: Principal | ICD-10-CM

## 2014-11-29 LAB — BASIC METABOLIC PANEL
Anion gap: 8 (ref 5–15)
BUN: 21 mg/dL (ref 6–23)
CALCIUM: 8.3 mg/dL — AB (ref 8.4–10.5)
CHLORIDE: 111 mmol/L (ref 96–112)
CO2: 25 mmol/L (ref 19–32)
Creatinine, Ser: 0.99 mg/dL (ref 0.50–1.35)
GFR, EST NON AFRICAN AMERICAN: 79 mL/min — AB (ref >90)
GLUCOSE: 100 mg/dL — AB (ref 70–99)
POTASSIUM: 3.8 mmol/L (ref 3.5–5.1)
Sodium: 144 mmol/L (ref 135–145)

## 2014-11-29 LAB — CBC
HEMATOCRIT: 36.4 % — AB (ref 39.0–52.0)
HEMOGLOBIN: 11.1 g/dL — AB (ref 13.0–17.0)
MCH: 29.4 pg (ref 26.0–34.0)
MCHC: 30.5 g/dL (ref 30.0–36.0)
MCV: 96.3 fL (ref 78.0–100.0)
PLATELETS: 284 10*3/uL (ref 150–400)
RBC: 3.78 MIL/uL — ABNORMAL LOW (ref 4.22–5.81)
RDW: 16.1 % — ABNORMAL HIGH (ref 11.5–15.5)
WBC: 17.8 10*3/uL — AB (ref 4.0–10.5)

## 2014-11-29 LAB — GLUCOSE, CAPILLARY
GLUCOSE-CAPILLARY: 121 mg/dL — AB (ref 70–99)
GLUCOSE-CAPILLARY: 130 mg/dL — AB (ref 70–99)
Glucose-Capillary: 108 mg/dL — ABNORMAL HIGH (ref 70–99)
Glucose-Capillary: 117 mg/dL — ABNORMAL HIGH (ref 70–99)
Glucose-Capillary: 127 mg/dL — ABNORMAL HIGH (ref 70–99)
Glucose-Capillary: 94 mg/dL (ref 70–99)

## 2014-11-29 LAB — HEMOGLOBIN A1C
Hgb A1c MFr Bld: 5.7 % — ABNORMAL HIGH (ref 4.8–5.6)
Mean Plasma Glucose: 117 mg/dL

## 2014-11-29 LAB — PROTIME-INR
INR: 4.33 — ABNORMAL HIGH (ref 0.00–1.49)
Prothrombin Time: 41.8 seconds — ABNORMAL HIGH (ref 11.6–15.2)

## 2014-11-29 MED ORDER — CLINDAMYCIN PHOSPHATE 900 MG/50ML IV SOLN
900.0000 mg | Freq: Three times a day (TID) | INTRAVENOUS | Status: DC
  Administered 2014-11-29 – 2014-12-01 (×6): 900 mg via INTRAVENOUS
  Filled 2014-11-29 (×10): qty 50

## 2014-11-29 NOTE — Progress Notes (Signed)
Sacral ulcer dressing changed. Copious amounts of purulent, sangiounous discharge noted. Wound irrigated with normal saline and patted dry. Packed with strip iodoform gauze per wound care nurse instructions. Covered with saline moistened 2x2 gauze, dry 4x4 gauze placed over top with pink foam dressing over that. Left foot cleaned, dried, and moisturized as ordered with prevalon boot reapplied. Patient tolerated well and medicated for pain as ordered.
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
## 2014-11-29 NOTE — Progress Notes (Signed)
[facility]                                                                              Patient Demographics  [name] [name], is a 74 y.o. male, DOB - Jul 15, 1941, ZOX:[id number]  Admit date - 11/27/2014   Admitting Physician Ron Parker, MD  Outpatient Primary MD for the patient is Terald Sleeper, MD  LOS - 2   Chief Complaint  Patient presents with  . Wound Infection      HPI on 11/27/2014 by Dr. Della Goo [name] [name] is a 74 y.o. male with a history of CVA , Dysphagia and Right hemiparesis, HTN DM2, Hyperlipidemia Chronic Anticoagulation due to PE/DVT Recent AKA of RLE, and chronic Sacral Decubitus Ulcer who was sent from the [facility] due to an infection of his Sacral decubitus ulcer after results of his wound culture returned positive for MRSA. A Ct scan of the ABD Pelvis was performed and revealed soft tissue inflammation and gas tracking but no definitive osteomyelitis of the sacral wound. He was started on IV Vancomycin and Zosyn and referred for admission.   Assessment & Plan  Sepsis secondary to Decubitus ulcer/wound infection -Culture was +MRSA -CT pelvis: soft tissue wound on medial aspect of the right buttock, air tracking, no focal osseous erosions to suggest osteomyelitis  -Continue IV Vanc and zosyn -Wound care consulted -Blood culture shows no growth to date -Will obtain repeat cultures -Will speak to general surgery  -Will add on clindamycin  Subacute C7 cervical fracture -CT cervical spine on 11/06/2014: Acute 35% anterior wedge compression of C7 vertebral body; Repeat CT on 11/25/2014- showed no change; degree of healing -Continue cervical collar  History of DVT/PE -on coumadin, currently supratherapeutic INR -Coumadin per pharmacy, held  Abdominal aortic aneurysm  -Noted on CT= 4.5cm x 4.0cm, needs 6 month follow up CTA -Patient is to continue follow up with vascular surgery  Hyperlipidemia -Continue statin  Diabetes  mellitus, type 2 -Continue ISS and CBG monitoring  Chronic kidney disease, stage 3 -Creatinine stable, 0.99  Code Status: DNR  Family Communication: None at bedside; family via phone  Disposition Plan: Admitted, continue IV antibiotics, surgery consulted  Time Spent in minutes   30 minutes  Procedures  None  Consults   Wound care General surgery  DVT Prophylaxis  Coumadin  Lab Results  Component Value Date   PLT 284 11/29/2014    Medications  Scheduled Meds: . Chlorhexidine Gluconate Cloth  6 each Topical Q0600  . citalopram  20 mg Oral Daily  . feeding supplement (ENSURE ENLIVE)  237 mL Oral BID BM  . feeding supplement (PRO-STAT SUGAR FREE 64)  30 mL Oral BID BM  . insulin aspart  0-9 Units Subcutaneous 6 times per day  . isosorbide mononitrate  30 mg Oral Daily  . mupirocin ointment  1 application Nasal BID  . OxyCODONE  10 mg Oral Q12H  . piperacillin-tazobactam (ZOSYN)  IV  3.375 g Intravenous 3 times per day  . pravastatin  80 mg Oral QPM  . tamsulosin  0.4 mg Oral Daily  . vancomycin  1,000 mg Intravenous Q12H  . [START ON 11/30/2014] Vitamin D (Ergocalciferol)  50,000 Units Oral Q7 days  . Warfarin - Physician Dosing Inpatient   Does not apply q1800   Continuous Infusions: . sodium chloride 75 mL/hr at 11/29/14 0528   PRN Meds:.acetaminophen **OR** acetaminophen, alum & mag hydroxide-simeth, HYDROmorphone (DILAUDID) injection, morphine injection, ondansetron **OR** ondansetron (ZOFRAN) IV, oxyCODONE  Antibiotics    Anti-infectives    Start     Dose/Rate Route Frequency Ordered Stop   11/28/14 1000  vancomycin (VANCOCIN) IVPB 1000 mg/200 mL premix     1,000 mg 200 mL/hr over 60 Minutes Intravenous Every 12 hours 11/28/14 0905     11/27/14 2345  vancomycin (VANCOCIN) 1,000 mg in sodium chloride 0.9 % 250 mL IVPB  Status:  Discontinued     1,000 mg 250 mL/hr over 60 Minutes Intravenous Every 12 hours 11/27/14 2335 11/27/14 2346   11/27/14 2230   vancomycin (VANCOCIN) 1,500 mg in sodium chloride 0.9 % 500 mL IVPB     1,500 mg 250 mL/hr over 120 Minutes Intravenous  Once 11/27/14 2214 11/28/14 0051   11/27/14 2200  piperacillin-tazobactam (ZOSYN) IVPB 3.375 g     3.375 g 100 mL/hr over 30 Minutes Intravenous  Once 11/27/14 2146 11/27/14 2301   11/27/14 0600  piperacillin-tazobactam (ZOSYN) IVPB 3.375 g     3.375 g 12.5 mL/hr over 240 Minutes Intravenous 3 times per day 11/27/14 2241          Subjective:   [name] seen and examined today. Patient has no complaints today.  Denies pain but state he would like ice cream.  Denies chest pain, shortness of breath, abdominal pain.   Objective:   Filed Vitals:   11/28/14 2130 11/29/14 0008 11/29/14 0225 11/29/14 0610  BP: 130/76  111/73 113/62  Pulse: 100  99 102  Temp: 103.2 F (39.6 C) 98.8 F (37.1 C) 99.2 F (37.3 C) 100.2 F (37.9 C)  TempSrc: Oral  Oral Oral  Resp: 20  20 20   Height:      Weight:      SpO2: 99%  98% 95%    Wt Readings from Last 3 Encounters:  11/27/14 69 kg (152 lb 1.9 oz)  11/03/14 78.019 kg (172 lb)  10/10/14 80.3 kg (177 lb 0.5 oz)     Intake/Output Summary (Last 24 hours) at 11/29/14 1222 Last data filed at 11/29/14 1610  Gross per 24 hour  Intake 2478.75 ml  Output    600 ml  Net 1878.75 ml    Exam  General: Well developed, elderly, no distress  HEENT: NCAT, mucous membranes moist.   Cardiovascular: S1 S2 auscultated, RRR, no mumurs  Respiratory: Clear to auscultation bilaterally with equal chest rise  Abdomen: Soft, nontender, nondistended, + bowel sounds  Extremities: RAKA, left foot in boot  Skin: Sacral wound- with dressing, some drainage  Data Review   Micro Results Recent Results (from the past 240 hour(s))  Wound culture     Status: None   Collection Time: 11/24/14 10:00 AM  Result Value Ref Range Status   Specimen Description WOUND SACRAL  Final   Special Requests NONE  Final   Gram Stain   Final     ABUNDANT WBC PRESENT,BOTH PMN AND MONONUCLEAR NO SQUAMOUS EPITHELIAL CELLS SEEN ABUNDANT GRAM POSITIVE COCCI IN PAIRS ABUNDANT GRAM NEGATIVE RODS Performed at [facility]    Culture   Final    RARE METHICILLIN RESISTANT STAPHYLOCOCCUS AUREUS Note: RIFAMPIN AND GENTAMICIN SHOULD NOT BE USED AS SINGLE DRUGS FOR TREATMENT OF STAPH INFECTIONS. This  organism is presumed to be Clindamycin resistant based on detection of inducible Clindamycin resistance. CRITICAL RESULT CALLED TO, READ BACK BY AND  VERIFIED WITH: MICHELLE M. AT 10:30AM ON 11/27/14 HAJAM REPORT FAXED BY REQUEST Performed at [facility]    Report Status 11/27/2014 FINAL  Final   Organism ID, Bacteria METHICILLIN RESISTANT STAPHYLOCOCCUS AUREUS  Final      Susceptibility   Methicillin resistant staphylococcus aureus - MIC*    CLINDAMYCIN RESISTANT      ERYTHROMYCIN >=8 RESISTANT Resistant     GENTAMICIN <=0.5 SENSITIVE Sensitive     LEVOFLOXACIN >=8 RESISTANT Resistant     OXACILLIN >=4 RESISTANT Resistant     PENICILLIN >=0.5 RESISTANT Resistant     RIFAMPIN <=0.5 SENSITIVE Sensitive     TRIMETH/SULFA <=10 SENSITIVE Sensitive     VANCOMYCIN 1 SENSITIVE Sensitive     TETRACYCLINE <=1 SENSITIVE Sensitive     * RARE METHICILLIN RESISTANT STAPHYLOCOCCUS AUREUS  Culture, blood (routine x 2)     Status: None (Preliminary result)   Collection Time: 11/27/14  8:19 PM  Result Value Ref Range Status   Specimen Description BLOOD LEFT ARM  Final   Special Requests BOTTLES DRAWN AEROBIC AND ANAEROBIC 6CC  Final   Culture NO GROWTH 2 DAYS  Final   Report Status PENDING  Incomplete  Culture, blood (routine x 2)     Status: None (Preliminary result)   Collection Time: 11/27/14  8:23 PM  Result Value Ref Range Status   Specimen Description BLOOD LEFT HAND  Final   Special Requests BOTTLES DRAWN AEROBIC AND ANAEROBIC 6CC  Final   Culture NO GROWTH 2 DAYS  Final   Report Status PENDING  Incomplete  MRSA PCR Screening      Status: Abnormal   Collection Time: 11/28/14  5:55 AM  Result Value Ref Range Status   MRSA by PCR POSITIVE (A) NEGATIVE Final    Comment:        The GeneXpert MRSA Assay (FDA approved for NASAL specimens only), is one component of a comprehensive MRSA colonization surveillance program. It is not intended to diagnose MRSA infection nor to guide or monitor treatment for MRSA infections. RESULT CALLED TO, READ BACK BY AND VERIFIED WITH: J.MAYS AT 0916 ON 11/28/14 BY S.VANHOORNE   Culture, Urine     Status: None (Preliminary result)   Collection Time: 11/28/14 10:45 AM  Result Value Ref Range Status   Specimen Description URINE, CATHETERIZED  Final   Special Requests VANCOMYCIN AND ZOSYN  Final   Culture PENDING  Incomplete   Report Status PENDING  Incomplete    Radiology Reports Dg Chest 1 View  11/27/2014   CLINICAL DATA:  Possible sepsis.  EXAM: CHEST  1 VIEW  COMPARISON:  11/2014.  FINDINGS: Low lung volumes. Cardiac enlargement. Aortic atherosclerosis. Slight vascular congestion. Mild bibasilar atelectasis. No frank infiltrates or overt failure. No visible pneumothorax. Similar appearance to priors.  IMPRESSION: Cardiomegaly.  No active disease.   Electronically Signed   By: Davonna Belling M.D.   On: 11/27/2014 21:18   Dg Ribs Unilateral W/chest Right  11/06/2014   CLINICAL DATA:  Larey Seat today, striking his head.  EXAM: RIGHT RIBS AND CHEST - 3+ VIEW  COMPARISON:  10/06/2014  FINDINGS: No fracture or other bone lesions are seen involving the ribs. There is no evidence of pneumothorax or pleural effusion. Heart size and mediastinal contours are within normal limits. There is interstitial coarsening, greatest in the periphery of the left base,  likely chronic. No acute confluent airspace opacities are evident.  IMPRESSION: Negative for acute displaced fracture, pneumothorax or hemothorax. There is interstitial coarsening, left greater than right.   Electronically Signed   By: Ellery Plunk M.D.   On: 11/06/2014 19:12   Dg Thoracic Spine W/swimmers  11/25/2014   CLINICAL DATA:  Larey Seat from bit tonight. Severe back pain. Recent C7 fracture.  EXAM: THORACIC SPINE - 2 VIEW + SWIMMERS  COMPARISON:  Recent cervical CT studies.  FINDINGS: Alignment is normal. There is ordinary thoracic spondylosis. There is an old compression fracture at T11 No acute fracture seen in the thoracic spine. C7 fracture shown at previous CT scan not clearly shown because of shoulder density.  IMPRESSION: Chronic degenerative changes. Old compression fracture T11. No evidence of acute thoracic region fracture.   Electronically Signed   By: Paulina Fusi M.D.   On: 11/25/2014 01:57   Dg Lumbar Spine Complete  11/25/2014   CLINICAL DATA:  Fall from bed with severe lower back pain.  EXAM: LUMBAR SPINE - COMPLETE 4+ VIEW  COMPARISON:  11/06/2014  FINDINGS: There is no evidence of fracture or traumatic malalignment. Anterior height loss at T11 and T12 is stable from previous radiography and abdominal CT 10/05/2014.  Bulky osteophytes without proportional disc narrowing, consistent with diffuse idiopathic skeletal hyperostosis. There is osteopenia.  IMPRESSION: 1. No acute osseous findings. 2. Diffuse idiopathic skeletal hyperostosis.   Electronically Signed   By: Marnee Spring M.D.   On: 11/25/2014 01:58   Dg Lumbar Spine Complete  11/06/2014   CLINICAL DATA:  Fall while being moved, striking head.  EXAM: LUMBAR SPINE - COMPLETE 4+ VIEW  COMPARISON:  10/05/2014 CT scan  FINDINGS: Atherosclerosis. Prominent stool throughout the colon favors constipation. Bony demineralization. Bridging anterior spurring suggesting diffuse idiopathic skeletal hyperostosis in the lumbar spine. Minimal anterior wedging at T12 and L1, not appreciably changed from 11/30/2013.  Infrarenal abdominal aortic aneurysm is faintly apparent.  IMPRESSION: 1. No acute fracture or acute subluxation is identified. Flowing spurring suggesting diffuse  idiopathic skeletal hyperostosis. 2. Bony demineralization. 3.  Prominent stool throughout the colon favors constipation. 4. Atherosclerosis with infrarenal abdominal aortic aneurysm.   Electronically Signed   By: Gaylyn Rong M.D.   On: 11/06/2014 19:13   Ct Head Wo Contrast  11/25/2014   CLINICAL DATA:  Patient found lying on the floor. Personal history of C7 fracture.  EXAM: CT HEAD WITHOUT CONTRAST  CT CERVICAL SPINE WITHOUT CONTRAST  TECHNIQUE: Multidetector CT imaging of the head and cervical spine was performed following the standard protocol without intravenous contrast. Multiplanar CT image reconstructions of the cervical spine were also generated.  COMPARISON:  11/06/2014 and multiple previous  FINDINGS: CT HEAD FINDINGS  There is chronic bifrontal atrophy E related to previous aneurysm clipping. The brain in general shows atrophy with chronic small vessel change throughout the white matter. No sign of acute infarction, mass lesion, hemorrhage, hydrocephalus or extra-axial collection. No skull fracture. Mucosal inflammatory changes affect the paranasal sinuses. There is atherosclerotic calcification of the major vessels at the base of the brain.  CT CERVICAL SPINE FINDINGS  No change since the previous study. There is a subacute fracture at the C7 level with loss of height anteriorly of 35%. No retropulsed bone. Fracture lines are slightly less distinct. No new fracture. Mild degenerative spondylosis and facet arthropathy, unchanged since before. Mild curvature convex to the right.  IMPRESSION: Head CT: No acute or traumatic finding. Bilateral frontal atrophy related  to previous aneurysm clipping. Inflammatory changes of the sinuses.  Cervical spine CT: No acute injury. Subacute compression fracture at C7 with loss of height anteriorly of 35%. Fracture lines are slightly less distinct, indicating there is a degree of healing.   Electronically Signed   By: Paulina Fusi M.D.   On: 11/25/2014 01:46     Ct Head Wo Contrast  11/06/2014   CLINICAL DATA:  Fall, striking head. Focal swelling along the posterior head. Neck pain. History of stroke and diabetes.  EXAM: CT HEAD WITHOUT CONTRAST  CT CERVICAL SPINE WITHOUT CONTRAST  TECHNIQUE: Multidetector CT imaging of the head and cervical spine was performed following the standard protocol without intravenous contrast. Multiplanar CT image reconstructions of the cervical spine were also generated.  COMPARISON:  10/05/2014  FINDINGS: CT HEAD FINDINGS  Metal artifacts along the midline of the anterior inferior falx likely reflecting clips. Bilateral anterior cerebral artery infarcts, chronic.  Periventricular white matter and corona radiata hypodensities favor chronic ischemic microvascular white matter disease. Remote lacunar infarcts in both lentiform nuclei and in the head of the left caudate. Lacunar infarct in the left periventricular white matter. These appear chronic.  Periventricular white matter and corona radiata hypodensities favor chronic ischemic microvascular white matter disease. Prior frontal craniotomy.  There is complete opacification of the visualized portion of the left maxillary sinus with subtotal opacification of the right maxillary sinus, opacification of multiple ethmoid air cells, and chronic sphenoid and frontal sinusitis.  No intracranial hemorrhage, mass lesion, or acute CVA. Mild scalp soft tissue swelling noted along the posterior vertex. The  CT CERVICAL SPINE FINDINGS  Advanced cervical spondylosis causing osseous foraminal stenosis especially bilaterally at C3-4 and C4-5.  Acute anterior compression fracture of C7 noted with 35% loss of height of the anterior vertebral body. Mild cortical discontinuity extends along the anterior portions of the superior and inferior endplates. No malalignment. New this fractures probably acute. No significant posterior bony retropulsion.  Dilated upper thoracic esophagus.  IMPRESSION: IMPRESSION 1.  Acute 35% anterior wedge compression of the C7 vertebral body, without posterior bony retropulsion or subluxation. 2. Chronic anterior cerebral artery infarcts with chronic scattered lacunar infarcts and chronic microvascular white matter disease, but no acute findings intracranially. 3. Chronic paranasal pansinusitis. I cannot exclude acute sinusitis of the left maxillary sinus ; the visualized portion of the left maxillary sinus is completely opacified but the entire sinus is not included. Prior frontal craniotomy. 4. Scalp soft tissue swelling along the posterior vertex of the head. 5. Dilated upper thoracic esophagus.  Critical Value/emergent results were called by telephone at the time of interpretation on 11/06/2014 at 7:51 pm to Dr. Samuel Jester , who verbally acknowledged these results.   Electronically Signed   By: Gaylyn Rong M.D.   On: 11/06/2014 19:51   Ct Cervical Spine Wo Contrast  11/25/2014   CLINICAL DATA:  Patient found lying on the floor. Personal history of C7 fracture.  EXAM: CT HEAD WITHOUT CONTRAST  CT CERVICAL SPINE WITHOUT CONTRAST  TECHNIQUE: Multidetector CT imaging of the head and cervical spine was performed following the standard protocol without intravenous contrast. Multiplanar CT image reconstructions of the cervical spine were also generated.  COMPARISON:  11/06/2014 and multiple previous  FINDINGS: CT HEAD FINDINGS  There is chronic bifrontal atrophy E related to previous aneurysm clipping. The brain in general shows atrophy with chronic small vessel change throughout the white matter. No sign of acute infarction, mass lesion, hemorrhage, hydrocephalus or extra-axial collection.  No skull fracture. Mucosal inflammatory changes affect the paranasal sinuses. There is atherosclerotic calcification of the major vessels at the base of the brain.  CT CERVICAL SPINE FINDINGS  No change since the previous study. There is a subacute fracture at the C7 level with loss of height  anteriorly of 35%. No retropulsed bone. Fracture lines are slightly less distinct. No new fracture. Mild degenerative spondylosis and facet arthropathy, unchanged since before. Mild curvature convex to the right.  IMPRESSION: Head CT: No acute or traumatic finding. Bilateral frontal atrophy related to previous aneurysm clipping. Inflammatory changes of the sinuses.  Cervical spine CT: No acute injury. Subacute compression fracture at C7 with loss of height anteriorly of 35%. Fracture lines are slightly less distinct, indicating there is a degree of healing.   Electronically Signed   By: Paulina Fusi M.D.   On: 11/25/2014 01:46   Ct Cervical Spine Wo Contrast  11/06/2014   CLINICAL DATA:  Fall, striking head. Focal swelling along the posterior head. Neck pain. History of stroke and diabetes.  EXAM: CT HEAD WITHOUT CONTRAST  CT CERVICAL SPINE WITHOUT CONTRAST  TECHNIQUE: Multidetector CT imaging of the head and cervical spine was performed following the standard protocol without intravenous contrast. Multiplanar CT image reconstructions of the cervical spine were also generated.  COMPARISON:  10/05/2014  FINDINGS: CT HEAD FINDINGS  Metal artifacts along the midline of the anterior inferior falx likely reflecting clips. Bilateral anterior cerebral artery infarcts, chronic.  Periventricular white matter and corona radiata hypodensities favor chronic ischemic microvascular white matter disease. Remote lacunar infarcts in both lentiform nuclei and in the head of the left caudate. Lacunar infarct in the left periventricular white matter. These appear chronic.  Periventricular white matter and corona radiata hypodensities favor chronic ischemic microvascular white matter disease. Prior frontal craniotomy.  There is complete opacification of the visualized portion of the left maxillary sinus with subtotal opacification of the right maxillary sinus, opacification of multiple ethmoid air cells, and chronic sphenoid and  frontal sinusitis.  No intracranial hemorrhage, mass lesion, or acute CVA. Mild scalp soft tissue swelling noted along the posterior vertex. The  CT CERVICAL SPINE FINDINGS  Advanced cervical spondylosis causing osseous foraminal stenosis especially bilaterally at C3-4 and C4-5.  Acute anterior compression fracture of C7 noted with 35% loss of height of the anterior vertebral body. Mild cortical discontinuity extends along the anterior portions of the superior and inferior endplates. No malalignment. New this fractures probably acute. No significant posterior bony retropulsion.  Dilated upper thoracic esophagus.  IMPRESSION: IMPRESSION 1. Acute 35% anterior wedge compression of the C7 vertebral body, without posterior bony retropulsion or subluxation. 2. Chronic anterior cerebral artery infarcts with chronic scattered lacunar infarcts and chronic microvascular white matter disease, but no acute findings intracranially. 3. Chronic paranasal pansinusitis. I cannot exclude acute sinusitis of the left maxillary sinus ; the visualized portion of the left maxillary sinus is completely opacified but the entire sinus is not included. Prior frontal craniotomy. 4. Scalp soft tissue swelling along the posterior vertex of the head. 5. Dilated upper thoracic esophagus.  Critical Value/emergent results were called by telephone at the time of interpretation on 11/06/2014 at 7:51 pm to Dr. Samuel Jester , who verbally acknowledged these results.   Electronically Signed   By: Gaylyn Rong M.D.   On: 11/06/2014 19:51   Ct Lumbar Spine Wo Contrast  11/27/2014   CLINICAL DATA:  Low back pain.  EXAM: CT LUMBAR SPINE WITHOUT CONTRAST  TECHNIQUE: Multidetector  CT imaging of the lumbar spine was performed without intravenous contrast administration. Multiplanar CT image reconstructions were also generated.  COMPARISON:  Lumbar radiographs 11/25/2014.  FINDINGS: Severe osseous demineralization. Anatomic alignment. Grossly  preserved intervertebral disc spaces. Prominent anterior osteophytosis consistent with DISH. No definite acute compression deformity or worrisome osseous lesion. Marked posterior spinous process spurring. No endplate destruction to suggest osteomyelitis. No paravertebral inflammatory process. No psoas abscess.  Abdominal aortic aneurysm formation. Transverse aorta measurement opposite L3 is 47 mm. Large RIGHT iliac artery aneurysm. Measurement opposite L5-S1 cross-section 51 x 46 mm.  Sacrum and pelvis findings described separately.  IMPRESSION: No acute lumbar spine abnormality.  Severe osseous demineralization.  Abdominal aortic aneurysm formation and RIGHT iliac for aneurysm formation.  Recommend followup by abdomen and pelvis CTA in 6 months, and vascular surgery referral/consultation if not already obtained. This recommendation follows ACR consensus guidelines: White Paper of the ACR Incidental Findings Committee II on Vascular Findings. J Am Coll Radiol 2013; 10:789-794.   Electronically Signed   By: Davonna Belling M.D.   On: 11/27/2014 21:15   Ct Pelvis Wo Contrast  11/27/2014   CLINICAL DATA:  Sacral wound infection. Possible sepsis. Lower back pain, acute onset. Initial encounter.  EXAM: CT PELVIS WITHOUT CONTRAST  TECHNIQUE: Multidetector CT imaging of the pelvis was performed following the standard protocol without intravenous contrast.  COMPARISON:  CT of the abdomen and pelvis from 10/05/2014  FINDINGS: There is a prominent soft tissue wound at the medial aspect of the right buttock, with significant soft tissue air tracking about the right gluteus musculature and adjacent soft tissues, extending inferiorly along the posterior proximal right thigh. Associated diffuse soft tissue inflammation is seen, without a defined abscess. Necrotizing fasciitis cannot be excluded, given the appearance of the air.  Additional soft tissue inflammation and mild soft tissue air extends posterior to the sacrum and  coccyx. Mild soft tissue inflammation extends superiorly along the right flank and mid back.  No focal osseous erosion is seen to suggest osteomyelitis at this time. The hip joints are unremarkable in appearance. The patient's right femoral intramedullary rod is grossly unremarkable; the patient is status post right above the knee amputation. The left-sided dynamic hip screw is grossly unremarkable in appearance. The pubic symphysis is unremarkable. The sacroiliac joints are within normal limits. Chronic osseous defect is noted at the right iliac wing.  The patient's abdominal aortic aneurysm is again noted, measuring 4.5 cm in transverse dimension and 4.0 cm in AP dimension at the level of the aortic bifurcation, with marked dilatation of the right common iliac artery, measuring up to 4.7 cm in diameter. Visualized small and large bowel loops are grossly unremarkable. The rectum is partially filled with stool. The bladder is largely decompressed. The scrotum is grossly unremarkable.  IMPRESSION: 1. Prominent soft tissue wound at the medial aspect of the right buttock, with significant soft tissue air tracking about the right gluteus musculature and adjacent soft tissues, extending inferiorly along the posterior proximal right thigh. Associated diffuse soft tissue inflammation, without a defined abscess. Given the appearance of the air, necrotizing fasciitis is a concern. 2. Additional soft tissue inflammation and mild soft tissue air extends posterior to the sacrum and coccyx. Mild soft tissue inflammation extends superiorly along the right flank and mid back. 3. No focal osseous erosions seen to suggest osteomyelitis at this time, though evaluation for osteomyelitis is mildly limited on CT. 4. Visualized bilateral femoral hardware is grossly unremarkable in appearance. 5. Abdominal aortic  aneurysm again noted, measuring 4.5 cm in transverse dimension and 4.0 cm in AP dimension at the level of the aortic  bifurcation, with marked dilatation of the right common iliac artery to 4.7 cm in diameter. Recommend followup by abdomen and pelvis CTA in 6 months, and vascular surgery referral/consultation if not already obtained. This recommendation follows ACR consensus guidelines: White Paper of the ACR Incidental Findings Committee II on Vascular Findings. J Am Coll Radiol 2013; 10:789-794.  These results were called by telephone at the time of interpretation on 11/27/2014 at 9:19 pm to Dr. Rolland Porter, who verbally acknowledged these results.   Electronically Signed   By: Roanna Raider M.D.   On: 11/27/2014 21:19   Dg Hip Unilat With Pelvis 2-3 Views Right  11/27/2014   CLINICAL DATA:  Right leg stump evaluation  EXAM: RIGHT HIP (WITH PELVIS) 2-3 VIEWS  COMPARISON:  None.  FINDINGS: There is no AP pelvis in this study limiting the exam. The right lower extremity has been amputated in the proximal femur diaphysis. There is a metal intra medullary rod that has also been partially cut. The metallic rod ends at the distal limit of the existing femur. No acute fracture. No dislocation. No destructive bone lesion. Heterotopic calcification is present in the distal stump soft tissues.  There is gas within the soft tissues over the right thigh. This is of unknown significance.  IMPRESSION: Status post amputation above the knee. No obvious destructive bone lesion.  There is gas in the soft tissues in the medial right thigh. Differential diagnosis includes herniated bowel or infection by gas-forming organism.   Electronically Signed   By: Jolaine Click M.D.   On: 11/27/2014 16:19    CBC  Recent Labs Lab 11/25/14 0800 11/27/14 2005 11/28/14 0516 11/29/14 0550  WBC 12.2* 10.9* 12.5* 17.8*  HGB 12.8* 12.4* 11.6* 11.1*  HCT 39.1 38.7* 35.8* 36.4*  PLT 283 291 296 284  MCV 95.1 95.8 95.7 96.3  MCH 31.1 30.7 31.0 29.4  MCHC 32.7 32.0 32.4 30.5  RDW 15.7* 16.1* 16.0* 16.1*  LYMPHSABS 2.5 2.2  --   --   MONOABS 1.3* 1.0   --   --   EOSABS 0.1 0.2  --   --   BASOSABS 0.0 0.0  --   --     Chemistries   Recent Labs Lab 11/25/14 0800 11/27/14 2005 11/28/14 0516 11/29/14 0550  NA 145 143 144 144  K 3.7 3.8 3.9 3.8  CL 112 108 113* 111  CO2 26 28 26 25   GLUCOSE 98 113* 96 100*  BUN 23 21 18 21   CREATININE 0.77 0.91 0.86 0.99  CALCIUM 8.8 8.8 8.5 8.3*  AST 30 30  --   --   ALT 24 22  --   --   ALKPHOS 74 69  --   --   BILITOT 0.3 0.4  --   --    ------------------------------------------------------------------------------------------------------------------ estimated creatinine clearance is 64.9 mL/min (by C-G formula based on Cr of 0.99). ------------------------------------------------------------------------------------------------------------------  Recent Labs  11/28/14 0516  HGBA1C 5.7*   ------------------------------------------------------------------------------------------------------------------ No results for input(s): CHOL, HDL, LDLCALC, TRIG, CHOLHDL, LDLDIRECT in the last 72 hours. ------------------------------------------------------------------------------------------------------------------ No results for input(s): TSH, T4TOTAL, T3FREE, THYROIDAB in the last 72 hours.  Invalid input(s): FREET3 ------------------------------------------------------------------------------------------------------------------ No results for input(s): VITAMINB12, FOLATE, FERRITIN, TIBC, IRON, RETICCTPCT in the last 72 hours.  Coagulation profile  Recent Labs Lab 11/28/14 0827 11/29/14 0550  INR 5.88* 4.33*    No  results for input(s): DDIMER in the last 72 hours.  Cardiac Enzymes No results for input(s): CKMB, TROPONINI, MYOGLOBIN in the last 168 hours.  Invalid input(s): CK ------------------------------------------------------------------------------------------------------------------ Invalid input(s): POCBNP    Cale Decarolis D.O. on 11/29/2014 at 12:22 PM  Between 7am to  7pm - Pager - [phone]  After 7pm go to [email or web address] - password [email or web address]  And look for the night coverage person covering for me after hours  [facility] Office  [phone]

## 2014-11-30 DIAGNOSIS — E43 Unspecified severe protein-calorie malnutrition: Secondary | ICD-10-CM

## 2014-11-30 LAB — BASIC METABOLIC PANEL
ANION GAP: 6 (ref 5–15)
BUN: 23 mg/dL (ref 6–23)
CALCIUM: 8.2 mg/dL — AB (ref 8.4–10.5)
CHLORIDE: 114 mmol/L — AB (ref 96–112)
CO2: 27 mmol/L (ref 19–32)
CREATININE: 0.9 mg/dL (ref 0.50–1.35)
GFR, EST NON AFRICAN AMERICAN: 82 mL/min — AB (ref >90)
Glucose, Bld: 85 mg/dL (ref 70–99)
Potassium: 3.5 mmol/L (ref 3.5–5.1)
Sodium: 147 mmol/L — ABNORMAL HIGH (ref 135–145)

## 2014-11-30 LAB — URINE CULTURE
COLONY COUNT: NO GROWTH
Culture: NO GROWTH

## 2014-11-30 LAB — GLUCOSE, CAPILLARY
GLUCOSE-CAPILLARY: 146 mg/dL — AB (ref 70–99)
GLUCOSE-CAPILLARY: 115 mg/dL — AB (ref 70–99)
Glucose-Capillary: 106 mg/dL — ABNORMAL HIGH (ref 70–99)
Glucose-Capillary: 135 mg/dL — ABNORMAL HIGH (ref 70–99)
Glucose-Capillary: 86 mg/dL (ref 70–99)
Glucose-Capillary: 89 mg/dL (ref 70–99)

## 2014-11-30 LAB — PROTIME-INR
INR: 5.12 (ref 0.00–1.49)
Prothrombin Time: 47.7 seconds — ABNORMAL HIGH (ref 11.6–15.2)

## 2014-11-30 LAB — CBC
HCT: 34.2 % — ABNORMAL LOW (ref 39.0–52.0)
Hemoglobin: 10.5 g/dL — ABNORMAL LOW (ref 13.0–17.0)
MCH: 29.9 pg (ref 26.0–34.0)
MCHC: 30.7 g/dL (ref 30.0–36.0)
MCV: 97.4 fL (ref 78.0–100.0)
Platelets: 247 10*3/uL (ref 150–400)
RBC: 3.51 MIL/uL — ABNORMAL LOW (ref 4.22–5.81)
RDW: 16.3 % — AB (ref 11.5–15.5)
WBC: 13.2 10*3/uL — AB (ref 4.0–10.5)

## 2014-11-30 MED ORDER — RESOURCE THICKENUP CLEAR PO POWD
ORAL | Status: DC | PRN
  Administered 2014-11-30: 16:00:00 via ORAL
  Filled 2014-11-30: qty 125

## 2014-11-30 MED ORDER — DAKINS (1/4 STRENGTH) 0.125 % EX SOLN
Freq: Two times a day (BID) | CUTANEOUS | Status: AC
  Administered 2014-11-30 – 2014-12-03 (×6)
  Filled 2014-11-30: qty 473

## 2014-11-30 MED ORDER — WARFARIN - PHARMACIST DOSING INPATIENT: Status: DC
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
## 2014-11-30 NOTE — Care Management Note (Addendum)
    Page 1 of 1   12/03/2014     9:27:26 AM CARE MANAGEMENT NOTE 12/03/2014  Patient:  Delrae SawyersWILSON,Ohm T   Account Number:  [id number]  Date Initiated:  11/30/2014  Documentation initiated by:  Kathyrn SheriffHILDRESS,JESSICA  Subjective/Objective Assessment:   [facility] Pt admitted for wound infection. Pt planning to return to SNF at discharge.[facility]  CSW arranging for placement. No CM needs.     Action/Plan:   Anticipated DC Date:  12/01/2014   Anticipated DC Plan:  SKILLED NURSING FACILITY  In-house referral  Clinical Social Worker      DC Planning Services  CM consult      Choice offered to / List presented to:             Status of service:  Completed, signed off Medicare Important Message given?  YES (If response is "NO", the following Medicare IM given date fields will be blank) Date Medicare IM given:  12/03/2014 Medicare IM given by:  Kathyrn SheriffHILDRESS,JESSICA Date Additional Medicare IM given:   Additional Medicare IM given by:    Discharge Disposition:  SKILLED NURSING FACILITY  Per UR Regulation:    If discussed at Long Length of Stay Meetings, dates discussed:    Comments:  12/03/2014 0930 Kathyrn SheriffJessica Childress, RN, MSN, CM Pt discharging to SNF today. No CM needs.  11/30/2014 1300 Kathyrn SheriffJessica Childress, RN, MSN, CM

## 2014-11-30 NOTE — Clinical Social Work Placement (Signed)
   CLINICAL SOCIAL WORK PLACEMENT  NOTE  Date:  11/30/2014  Patient Details  Name: [name] [name] MRN: [id number] Date of Birth: 09/14/1940  Clinical Social Work is seeking post-discharge placement for this patient at the Skilled  Nursing Facility level of care (*CSW will initial, date and re-position this form in  chart as items are completed):  Yes   Patient/family provided with [facility] Clinical Social Work Department's list of facilities offering this level of care within the geographic area requested by the patient (or if unable, by the patient's family).  Yes   Patient/family informed of their freedom to choose among providers that offer the needed level of care, that participate in Medicare, Medicaid or managed care program needed by the patient, have an available bed and are willing to accept the patient.  Yes   Patient/family informed of [facility]'s ownership interest [facility] and [facility], as well as of the fact that they are under no obligation to receive care at these facilities.  PASRR submitted to EDS on       PASRR number received on       Existing PASRR number confirmed on 11/30/14     FL2 transmitted to all facilities in geographic area requested by pt/family on 11/30/14     FL2 transmitted to all facilities within larger geographic area on       Patient informed that his/her managed care company has contracts with or will negotiate with certain facilities, including the following:            Patient/family informed of bed offers received.  Patient chooses bed at       Physician recommends and patient chooses bed at      Patient to be transferred to   on  .  Patient to be transferred to facility by       Patient family notified on   of transfer.  Name of family member notified:        PHYSICIAN       Additional Comment:    _______________________________________________ Karn CassisStultz, [name] Mudgett [place], LCSW 11/30/2014, 12:50 PM

## 2014-11-30 NOTE — Progress Notes (Addendum)
[facility]                                                                              Patient Demographics  [name] [name], is a 74 y.o. male, DOB - 06-Mar-1941, MVH:[id number]  Admit date - 11/27/2014   Admitting Physician Ron Parker, MD  Outpatient Primary MD for the patient is Terald Sleeper, MD  LOS - 3   Chief Complaint  Patient presents with  . Wound Infection      HPI on 11/27/2014 by Dr. Della Goo [name] [name] is a 74 y.o. male with a history of CVA , Dysphagia and Right hemiparesis, HTN DM2, Hyperlipidemia Chronic Anticoagulation due to PE/DVT Recent AKA of RLE, and chronic Sacral Decubitus Ulcer who was sent from the [facility] due to an infection of his Sacral decubitus ulcer after results of his wound culture returned positive for MRSA. A Ct scan of the ABD Pelvis was performed and revealed soft tissue inflammation and gas tracking but no definitive osteomyelitis of the sacral wound. He was started on IV Vancomycin and Zosyn and referred for admission.   Interim history: Patient did have worsening of his leukocytosis however added clindamycin. Patient seen by general surgery. No evidence of Fournier's gangrene. Continue antibiotics however will discontinue Zosyn. Blood cultures currently show no growth to date.    Assessment & Plan  Sepsis secondary to Decubitus ulcer/wound infection -Culture was +MRSA -CT pelvis: soft tissue wound on medial aspect of the right buttock, air tracking, no focal osseous erosions to suggest osteomyelitis  -initially placed on  IV Vanc and zosyn -Added clinidamycin 4/25, and leukocytosis improved -Will discontinue zosyn today, 4/26 -Wound care consulted -Blood culture from 4/23 and 4/25 show no growth to date -General surgery consulted and appreciated- no need for further surgical debridement  Subacute C7 cervical fracture -CT cervical spine on 11/06/2014: Acute 35% anterior wedge compression of C7 vertebral  body; Repeat CT on 11/25/2014- showed no change; degree of healing -Continue cervical collar  History of DVT/PE -on coumadin, currently supratherapeutic INR -Coumadin per pharmacy, held -no evidence of bleeding, will continue to monitor  Abdominal aortic aneurysm  -Noted on CT= 4.5cm x 4.0cm, needs 6 month follow up CTA -Patient is to continue follow up with vascular surgery  Hyperlipidemia -Continue statin  Diabetes mellitus, type 2 -Continue ISS and CBG monitoring  Chronic kidney disease, stage 3 -Creatinine stable, 0.90  Severe protein calorie malnutrition -Nutrition consulted -Continue feeding supplementation  Code Status: DNR  Family Communication: Daughter at bedside.  Disposition Plan: Admitted, continue IV antibiotics.  If continues to improve, possible discharge within next 24-48hours  Time Spent in minutes   30 minutes  Procedures  None  Consults   Wound care General surgery  DVT Prophylaxis  Coumadin  Lab Results  Component Value Date   PLT 247 11/30/2014    Medications  Scheduled Meds: . Chlorhexidine Gluconate Cloth  6 each Topical Q0600  . citalopram  20 mg Oral Daily  . clindamycin (CLEOCIN) IV  900 mg Intravenous 3 times per day  . feeding supplement (ENSURE ENLIVE)  237 mL Oral BID BM  . feeding supplement (PRO-STAT SUGAR FREE 64)  30 mL Oral BID BM  . insulin aspart  0-9 Units Subcutaneous 6 times per day  . isosorbide mononitrate  30 mg Oral Daily  . mupirocin ointment  1 application Nasal BID  . OxyCODONE  10 mg Oral Q12H  . pravastatin  80 mg Oral QPM  . sodium hypochlorite   Irrigation BID  . tamsulosin  0.4 mg Oral Daily  . vancomycin  1,000 mg Intravenous Q12H  . Vitamin D (Ergocalciferol)  50,000 Units Oral Q7 days  . Warfarin - Physician Dosing Inpatient   Does not apply q1800   Continuous Infusions: . sodium chloride 75 mL/hr at 11/30/14 1153   PRN Meds:.acetaminophen **OR** acetaminophen, alum & mag hydroxide-simeth,  HYDROmorphone (DILAUDID) injection, morphine injection, ondansetron **OR** ondansetron (ZOFRAN) IV, oxyCODONE, RESOURCE THICKENUP CLEAR  Antibiotics    Anti-infectives    Start     Dose/Rate Route Frequency Ordered Stop   11/29/14 1400  clindamycin (CLEOCIN) IVPB 900 mg     900 mg 100 mL/hr over 30 Minutes Intravenous 3 times per day 11/29/14 1238     11/28/14 1000  vancomycin (VANCOCIN) IVPB 1000 mg/200 mL premix     1,000 mg 200 mL/hr over 60 Minutes Intravenous Every 12 hours 11/28/14 0905     11/27/14 2345  vancomycin (VANCOCIN) 1,000 mg in sodium chloride 0.9 % 250 mL IVPB  Status:  Discontinued     1,000 mg 250 mL/hr over 60 Minutes Intravenous Every 12 hours 11/27/14 2335 11/27/14 2346   11/27/14 2230  vancomycin (VANCOCIN) 1,500 mg in sodium chloride 0.9 % 500 mL IVPB     1,500 mg 250 mL/hr over 120 Minutes Intravenous  Once 11/27/14 2214 11/28/14 0051   11/27/14 2200  piperacillin-tazobactam (ZOSYN) IVPB 3.375 g     3.375 g 100 mL/hr over 30 Minutes Intravenous  Once 11/27/14 2146 11/27/14 2301   11/27/14 0600  piperacillin-tazobactam (ZOSYN) IVPB 3.375 g  Status:  Discontinued     3.375 g 12.5 mL/hr over 240 Minutes Intravenous 3 times per day 11/27/14 2241 11/30/14 1610        Subjective:   [name] [name] seen and examined today. Patient has no complaints today, just wishes to eat icecream and drink milk.  Denies chest pain, shortness of breath, abdominal pain.   Objective:   Filed Vitals:   11/29/14 0610 11/29/14 1534 11/29/14 2045 11/30/14 0430  BP: 113/62  106/55 110/73  Pulse: 102 96 87 74  Temp: 100.2 F (37.9 C) 97.7 F (36.5 C) 98.4 F (36.9 C) 98.2 F (36.8 C)  TempSrc: Oral Oral Oral Oral  Resp: Height:      Weight:      SpO2: 95% 97% 98% 97%    Wt Readings from Last 3 Encounters:  11/27/14 69 kg (152 lb 1.9 oz)  11/03/14 78.019 kg (172 lb)  10/10/14 80.3 kg (177 lb 0.5 oz)     Intake/Output Summary (Last 24 hours) at 11/30/14  1228 Last data filed at 11/30/14 9604  Gross per 24 hour  Intake   1365 ml  Output    850 ml  Net    515 ml    Exam  General: Well developed, elderly, no distress  HEENT: NCAT, mucous membranes moist.   Neck: in cervical collar  Cardiovascular: RRR, normal S1/S2  Respiratory: Clear to auscultation  Abdomen: Soft, nontender, nondistended, + bowel sounds  Extremities: RAKA, left foot in boot  Skin: Sacral wound- with dressing  Data Review  Micro Results Recent Results (from the past 240 hour(s))  Wound culture     Status: None   Collection Time: 11/24/14 10:00 AM  Result Value Ref Range Status   Specimen Description WOUND SACRAL  Final   Special Requests NONE  Final   Gram Stain   Final    ABUNDANT WBC PRESENT,BOTH PMN AND MONONUCLEAR NO SQUAMOUS EPITHELIAL CELLS SEEN ABUNDANT GRAM POSITIVE COCCI IN PAIRS ABUNDANT GRAM NEGATIVE RODS Performed at [facility]    Culture   Final    RARE METHICILLIN RESISTANT STAPHYLOCOCCUS AUREUS Note: RIFAMPIN AND GENTAMICIN SHOULD NOT BE USED AS SINGLE DRUGS FOR TREATMENT OF STAPH INFECTIONS. This organism is presumed to be Clindamycin resistant based on detection of inducible Clindamycin resistance. CRITICAL RESULT CALLED TO, READ BACK BY AND  VERIFIED WITH: [name] [name]. AT 10:30AM ON 11/27/14 HAJAM REPORT FAXED BY REQUEST Performed at [facility]    Report Status 11/27/2014 FINAL  Final   Organism ID, Bacteria METHICILLIN RESISTANT STAPHYLOCOCCUS AUREUS  Final      Susceptibility   Methicillin resistant staphylococcus aureus - MIC*    CLINDAMYCIN RESISTANT      ERYTHROMYCIN >=8 RESISTANT Resistant     GENTAMICIN <=0.5 SENSITIVE Sensitive     LEVOFLOXACIN >=8 RESISTANT Resistant     OXACILLIN >=4 RESISTANT Resistant     PENICILLIN >=0.5 RESISTANT Resistant     RIFAMPIN <=0.5 SENSITIVE Sensitive     TRIMETH/SULFA <=10 SENSITIVE Sensitive     VANCOMYCIN 1 SENSITIVE Sensitive     TETRACYCLINE <=1 SENSITIVE  Sensitive     * RARE METHICILLIN RESISTANT STAPHYLOCOCCUS AUREUS  Culture, blood (routine x 2)     Status: None (Preliminary result)   Collection Time: 11/27/14  8:19 PM  Result Value Ref Range Status   Specimen Description BLOOD LEFT ARM  Final   Special Requests BOTTLES DRAWN AEROBIC AND ANAEROBIC 6CC  Final   Culture NO GROWTH 3 DAYS  Final   Report Status PENDING  Incomplete  Culture, blood (routine x 2)     Status: None (Preliminary result)   Collection Time: 11/27/14  8:23 PM  Result Value Ref Range Status   Specimen Description BLOOD LEFT HAND  Final   Special Requests BOTTLES DRAWN AEROBIC AND ANAEROBIC 6CC  Final   Culture NO GROWTH 3 DAYS  Final   Report Status PENDING  Incomplete  MRSA PCR Screening     Status: Abnormal   Collection Time: 11/28/14  5:55 AM  Result Value Ref Range Status   MRSA by PCR POSITIVE (A) NEGATIVE Final    Comment:        The GeneXpert MRSA Assay (FDA approved for NASAL specimens only), is one component of a comprehensive MRSA colonization surveillance program. It is not intended to diagnose MRSA infection nor to guide or monitor treatment for MRSA infections. RESULT CALLED TO, READ BACK BY AND VERIFIED WITH: [name] [name] AT 0916 ON 11/28/14 BY S.VANHOORNE   Culture, Urine     Status: None   Collection Time: 11/28/14 10:45 AM  Result Value Ref Range Status   Specimen Description URINE, CATHETERIZED  Final   Special Requests VANCOMYCIN AND ZOSYN  Final   Colony Count NO GROWTH Performed at [facility]   Final   Culture NO GROWTH Performed at [facility]   Final   Report Status 11/30/2014 FINAL  Final  Culture, blood (routine x 2)     Status: None (Preliminary result)   Collection Time: 11/29/14  1:52 PM  Result Value Ref Range Status   Specimen Description BLOOD  Final   Special Requests NONE  Final   Culture NO GROWTH 1 DAY  Final   Report Status PENDING  Incomplete  Culture, blood (routine x 2)     Status: None  (Preliminary result)   Collection Time: 11/29/14  2:01 PM  Result Value Ref Range Status   Specimen Description BLOOD  Final   Special Requests NONE  Final   Culture NO GROWTH 1 DAY  Final   Report Status PENDING  Incomplete    Radiology Reports Dg Chest 1 View  11/27/2014   CLINICAL DATA:  Possible sepsis.  EXAM: CHEST  1 VIEW  COMPARISON:  11/2014.  FINDINGS: Low lung volumes. Cardiac enlargement. Aortic atherosclerosis. Slight vascular congestion. Mild bibasilar atelectasis. No frank infiltrates or overt failure. No visible pneumothorax. Similar appearance to priors.  IMPRESSION: Cardiomegaly.  No active disease.   Electronically Signed   By: [name] BellingJohn  [name] [name].D.   On: 11/27/2014 21:18   Dg Ribs Unilateral W/chest Right  11/06/2014   CLINICAL DATA:  Larey SeatFell today, striking his head.  EXAM: RIGHT RIBS AND CHEST - 3+ VIEW  COMPARISON:  10/06/2014  FINDINGS: No fracture or other bone lesions are seen involving the ribs. There is no evidence of pneumothorax or pleural effusion. Heart size and mediastinal contours are within normal limits. There is interstitial coarsening, greatest in the periphery of the left base, likely chronic. No acute confluent airspace opacities are evident.  IMPRESSION: Negative for acute displaced fracture, pneumothorax or hemothorax. There is interstitial coarsening, left greater than right.   Electronically Signed   By: [name] Plunkaniel R [name] [name].D.   On: 11/06/2014 19:12   Dg Thoracic Spine W/swimmers  11/25/2014   CLINICAL DATA:  Larey SeatFell from bit tonight. Severe back pain. Recent C7 fracture.  EXAM: THORACIC SPINE - 2 VIEW + SWIMMERS  COMPARISON:  Recent cervical CT studies.  FINDINGS: Alignment is normal. There is ordinary thoracic spondylosis. There is an old compression fracture at T11 No acute fracture seen in the thoracic spine. C7 fracture shown at previous CT scan not clearly shown because of shoulder density.  IMPRESSION: Chronic degenerative changes. Old compression fracture T11.  No evidence of acute thoracic region fracture.   Electronically Signed   By: [name] FusiMark  [name] [name].D.   On: 11/25/2014 01:57   Dg Lumbar Spine Complete  11/25/2014   CLINICAL DATA:  Fall from bed with severe lower back pain.  EXAM: LUMBAR SPINE - COMPLETE 4+ VIEW  COMPARISON:  11/06/2014  FINDINGS: There is no evidence of fracture or traumatic malalignment. Anterior height loss at T11 and T12 is stable from previous radiography and abdominal CT 10/05/2014.  Bulky osteophytes without proportional disc narrowing, consistent with diffuse idiopathic skeletal hyperostosis. There is osteopenia.  IMPRESSION: 1. No acute osseous findings. 2. Diffuse idiopathic skeletal hyperostosis.   Electronically Signed   By: [name] SpringJonathon  [name] [name].D.   On: 11/25/2014 01:58   Dg Lumbar Spine Complete  11/06/2014   CLINICAL DATA:  Fall while being moved, striking head.  EXAM: LUMBAR SPINE - COMPLETE 4+ VIEW  COMPARISON:  10/05/2014 CT scan  FINDINGS: Atherosclerosis. Prominent stool throughout the colon favors constipation. Bony demineralization. Bridging anterior spurring suggesting diffuse idiopathic skeletal hyperostosis in the lumbar spine. Minimal anterior wedging at T12 and L1, not appreciably changed from 11/30/2013.  Infrarenal abdominal aortic aneurysm is faintly apparent.  IMPRESSION: 1. No acute fracture or acute subluxation is identified. Flowing spurring  suggesting diffuse idiopathic skeletal hyperostosis. 2. Bony demineralization. 3.  Prominent stool throughout the colon favors constipation. 4. Atherosclerosis with infrarenal abdominal aortic aneurysm.   Electronically Signed   By: [name] [name].D.   On: 11/06/2014 19:13   Ct Head Wo Contrast  11/25/2014   CLINICAL DATA:  Patient found lying on the floor. Personal history of C7 fracture.  EXAM: CT HEAD WITHOUT CONTRAST  CT CERVICAL SPINE WITHOUT CONTRAST  TECHNIQUE: Multidetector CT imaging of the head and cervical spine was performed following the standard protocol  without intravenous contrast. Multiplanar CT image reconstructions of the cervical spine were also generated.  COMPARISON:  11/06/2014 and multiple previous  FINDINGS: CT HEAD FINDINGS  There is chronic bifrontal atrophy E related to previous aneurysm clipping. The brain in general shows atrophy with chronic small vessel change throughout the white matter. No sign of acute infarction, mass lesion, hemorrhage, hydrocephalus or extra-axial collection. No skull fracture. Mucosal inflammatory changes affect the paranasal sinuses. There is atherosclerotic calcification of the major vessels at the base of the brain.  CT CERVICAL SPINE FINDINGS  No change since the previous study. There is a subacute fracture at the C7 level with loss of height anteriorly of 35%. No retropulsed bone. Fracture lines are slightly less distinct. No new fracture. Mild degenerative spondylosis and facet arthropathy, unchanged since before. Mild curvature convex to the right.  IMPRESSION: Head CT: No acute or traumatic finding. Bilateral frontal atrophy related to previous aneurysm clipping. Inflammatory changes of the sinuses.  Cervical spine CT: No acute injury. Subacute compression fracture at C7 with loss of height anteriorly of 35%. Fracture lines are slightly less distinct, indicating there is a degree of healing.   Electronically Signed   By: [name] Fusi [name].D.   On: 11/25/2014 01:46   Ct Head Wo Contrast  11/06/2014   CLINICAL DATA:  Fall, striking head. Focal swelling along the posterior head. Neck pain. History of stroke and diabetes.  EXAM: CT HEAD WITHOUT CONTRAST  CT CERVICAL SPINE WITHOUT CONTRAST  TECHNIQUE: Multidetector CT imaging of the head and cervical spine was performed following the standard protocol without intravenous contrast. Multiplanar CT image reconstructions of the cervical spine were also generated.  COMPARISON:  10/05/2014  FINDINGS: CT HEAD FINDINGS  Metal artifacts along the midline of the anterior inferior  falx likely reflecting clips. Bilateral anterior cerebral artery infarcts, chronic.  Periventricular white matter and corona radiata hypodensities favor chronic ischemic microvascular white matter disease. Remote lacunar infarcts in both lentiform nuclei and in the head of the left caudate. Lacunar infarct in the left periventricular white matter. These appear chronic.  Periventricular white matter and corona radiata hypodensities favor chronic ischemic microvascular white matter disease. Prior frontal craniotomy.  There is complete opacification of the visualized portion of the left maxillary sinus with subtotal opacification of the right maxillary sinus, opacification of multiple ethmoid air cells, and chronic sphenoid and frontal sinusitis.  No intracranial hemorrhage, mass lesion, or acute CVA. Mild scalp soft tissue swelling noted along the posterior vertex. The  CT CERVICAL SPINE FINDINGS  Advanced cervical spondylosis causing osseous foraminal stenosis especially bilaterally at C3-4 and C4-5.  Acute anterior compression fracture of C7 noted with 35% loss of height of the anterior vertebral body. Mild cortical discontinuity extends along the anterior portions of the superior and inferior endplates. No malalignment. New this fractures probably acute. No significant posterior bony retropulsion.  Dilated upper thoracic esophagus.  IMPRESSION: IMPRESSION 1. Acute 35% anterior wedge compression  of the C7 vertebral body, without posterior bony retropulsion or subluxation. 2. Chronic anterior cerebral artery infarcts with chronic scattered lacunar infarcts and chronic microvascular white matter disease, but no acute findings intracranially. 3. Chronic paranasal pansinusitis. I cannot exclude acute sinusitis of the left maxillary sinus ; the visualized portion of the left maxillary sinus is completely opacified but the entire sinus is not included. Prior frontal craniotomy. 4. Scalp soft tissue swelling along the  posterior vertex of the head. 5. Dilated upper thoracic esophagus.  Critical Value/emergent results were called by telephone at the time of interpretation on 11/06/2014 at 7:51 pm to [name] [name] , who verbally acknowledged these results.   Electronically Signed   By: [name] [name].D.   On: 11/06/2014 19:51   Ct Cervical Spine Wo Contrast  11/25/2014   CLINICAL DATA:  Patient found lying on the floor. Personal history of C7 fracture.  EXAM: CT HEAD WITHOUT CONTRAST  CT CERVICAL SPINE WITHOUT CONTRAST  TECHNIQUE: Multidetector CT imaging of the head and cervical spine was performed following the standard protocol without intravenous contrast. Multiplanar CT image reconstructions of the cervical spine were also generated.  COMPARISON:  11/06/2014 and multiple previous  FINDINGS: CT HEAD FINDINGS  There is chronic bifrontal atrophy E related to previous aneurysm clipping. The brain in general shows atrophy with chronic small vessel change throughout the white matter. No sign of acute infarction, mass lesion, hemorrhage, hydrocephalus or extra-axial collection. No skull fracture. Mucosal inflammatory changes affect the paranasal sinuses. There is atherosclerotic calcification of the major vessels at the base of the brain.  CT CERVICAL SPINE FINDINGS  No change since the previous study. There is a subacute fracture at the C7 level with loss of height anteriorly of 35%. No retropulsed bone. Fracture lines are slightly less distinct. No new fracture. Mild degenerative spondylosis and facet arthropathy, unchanged since before. Mild curvature convex to the right.  IMPRESSION: Head CT: No acute or traumatic finding. Bilateral frontal atrophy related to previous aneurysm clipping. Inflammatory changes of the sinuses.  Cervical spine CT: No acute injury. Subacute compression fracture at C7 with loss of height anteriorly of 35%. Fracture lines are slightly less distinct, indicating there is a degree of healing.    Electronically Signed   By: [name] Fusi [name].D.   On: 11/25/2014 01:46   Ct Cervical Spine Wo Contrast  11/06/2014   CLINICAL DATA:  Fall, striking head. Focal swelling along the posterior head. Neck pain. History of stroke and diabetes.  EXAM: CT HEAD WITHOUT CONTRAST  CT CERVICAL SPINE WITHOUT CONTRAST  TECHNIQUE: Multidetector CT imaging of the head and cervical spine was performed following the standard protocol without intravenous contrast. Multiplanar CT image reconstructions of the cervical spine were also generated.  COMPARISON:  10/05/2014  FINDINGS: CT HEAD FINDINGS  Metal artifacts along the midline of the anterior inferior falx likely reflecting clips. Bilateral anterior cerebral artery infarcts, chronic.  Periventricular white matter and corona radiata hypodensities favor chronic ischemic microvascular white matter disease. Remote lacunar infarcts in both lentiform nuclei and in the head of the left caudate. Lacunar infarct in the left periventricular white matter. These appear chronic.  Periventricular white matter and corona radiata hypodensities favor chronic ischemic microvascular white matter disease. Prior frontal craniotomy.  There is complete opacification of the visualized portion of the left maxillary sinus with subtotal opacification of the right maxillary sinus, opacification of multiple ethmoid air cells, and chronic sphenoid and frontal sinusitis.  No intracranial hemorrhage, mass  lesion, or acute CVA. Mild scalp soft tissue swelling noted along the posterior vertex. The  CT CERVICAL SPINE FINDINGS  Advanced cervical spondylosis causing osseous foraminal stenosis especially bilaterally at C3-4 and C4-5.  Acute anterior compression fracture of C7 noted with 35% loss of height of the anterior vertebral body. Mild cortical discontinuity extends along the anterior portions of the superior and inferior endplates. No malalignment. New this fractures probably acute. No significant posterior bony  retropulsion.  Dilated upper thoracic esophagus.  IMPRESSION: IMPRESSION 1. Acute 35% anterior wedge compression of the C7 vertebral body, without posterior bony retropulsion or subluxation. 2. Chronic anterior cerebral artery infarcts with chronic scattered lacunar infarcts and chronic microvascular white matter disease, but no acute findings intracranially. 3. Chronic paranasal pansinusitis. I cannot exclude acute sinusitis of the left maxillary sinus ; the visualized portion of the left maxillary sinus is completely opacified but the entire sinus is not included. Prior frontal craniotomy. 4. Scalp soft tissue swelling along the posterior vertex of the head. 5. Dilated upper thoracic esophagus.  Critical Value/emergent results were called by telephone at the time of interpretation on 11/06/2014 at 7:51 pm to [name] [name] , who verbally acknowledged these results.   Electronically Signed   By: [name] [name].D.   On: 11/06/2014 19:51   Ct Lumbar Spine Wo Contrast  11/27/2014   CLINICAL DATA:  Low back pain.  EXAM: CT LUMBAR SPINE WITHOUT CONTRAST  TECHNIQUE: Multidetector CT imaging of the lumbar spine was performed without intravenous contrast administration. Multiplanar CT image reconstructions were also generated.  COMPARISON:  Lumbar radiographs 11/25/2014.  FINDINGS: Severe osseous demineralization. Anatomic alignment. Grossly preserved intervertebral disc spaces. Prominent anterior osteophytosis consistent with DISH. No definite acute compression deformity or worrisome osseous lesion. Marked posterior spinous process spurring. No endplate destruction to suggest osteomyelitis. No paravertebral inflammatory process. No psoas abscess.  Abdominal aortic aneurysm formation. Transverse aorta measurement opposite L3 is 47 mm. Large RIGHT iliac artery aneurysm. Measurement opposite L5-S1 cross-section 51 x 46 mm.  Sacrum and pelvis findings described separately.  IMPRESSION: No acute lumbar spine  abnormality.  Severe osseous demineralization.  Abdominal aortic aneurysm formation and RIGHT iliac for aneurysm formation.  Recommend followup by abdomen and pelvis CTA in 6 months, and vascular surgery referral/consultation if not already obtained. This recommendation follows ACR consensus guidelines: White Paper of the ACR Incidental Findings Committee II on Vascular Findings. J Am Coll Radiol 2013; 10:789-794.   Electronically Signed   By: [name] Belling [name].D.   On: 11/27/2014 21:15   Ct Pelvis Wo Contrast  11/27/2014   CLINICAL DATA:  Sacral wound infection. Possible sepsis. Lower back pain, acute onset. Initial encounter.  EXAM: CT PELVIS WITHOUT CONTRAST  TECHNIQUE: Multidetector CT imaging of the pelvis was performed following the standard protocol without intravenous contrast.  COMPARISON:  CT of the abdomen and pelvis from 10/05/2014  FINDINGS: There is a prominent soft tissue wound at the medial aspect of the right buttock, with significant soft tissue air tracking about the right gluteus musculature and adjacent soft tissues, extending inferiorly along the posterior proximal right thigh. Associated diffuse soft tissue inflammation is seen, without a defined abscess. Necrotizing fasciitis cannot be excluded, given the appearance of the air.  Additional soft tissue inflammation and mild soft tissue air extends posterior to the sacrum and coccyx. Mild soft tissue inflammation extends superiorly along the right flank and mid back.  No focal osseous erosion is seen to suggest osteomyelitis at this time. The  hip joints are unremarkable in appearance. The patient's right femoral intramedullary rod is grossly unremarkable; the patient is status post right above the knee amputation. The left-sided dynamic hip screw is grossly unremarkable in appearance. The pubic symphysis is unremarkable. The sacroiliac joints are within normal limits. Chronic osseous defect is noted at the right iliac wing.  The patient's  abdominal aortic aneurysm is again noted, measuring 4.5 cm in transverse dimension and 4.0 cm in AP dimension at the level of the aortic bifurcation, with marked dilatation of the right common iliac artery, measuring up to 4.7 cm in diameter. Visualized small and large bowel loops are grossly unremarkable. The rectum is partially filled with stool. The bladder is largely decompressed. The scrotum is grossly unremarkable.  IMPRESSION: 1. Prominent soft tissue wound at the medial aspect of the right buttock, with significant soft tissue air tracking about the right gluteus musculature and adjacent soft tissues, extending inferiorly along the posterior proximal right thigh. Associated diffuse soft tissue inflammation, without a defined abscess. Given the appearance of the air, necrotizing fasciitis is a concern. 2. Additional soft tissue inflammation and mild soft tissue air extends posterior to the sacrum and coccyx. Mild soft tissue inflammation extends superiorly along the right flank and mid back. 3. No focal osseous erosions seen to suggest osteomyelitis at this time, though evaluation for osteomyelitis is mildly limited on CT. 4. Visualized bilateral femoral hardware is grossly unremarkable in appearance. 5. Abdominal aortic aneurysm again noted, measuring 4.5 cm in transverse dimension and 4.0 cm in AP dimension at the level of the aortic bifurcation, with marked dilatation of the right common iliac artery to 4.7 cm in diameter. Recommend followup by abdomen and pelvis CTA in 6 months, and vascular surgery referral/consultation if not already obtained. This recommendation follows ACR consensus guidelines: White Paper of the ACR Incidental Findings Committee II on Vascular Findings. J Am Coll Radiol 2013; 10:789-794.  These results were called by telephone at the time of interpretation on 11/27/2014 at 9:19 pm to [name] [name], who verbally acknowledged these results.   Electronically Signed   By: [name] Raider  [name].D.   On: 11/27/2014 21:19   Dg Hip Unilat With Pelvis 2-3 Views Right  11/27/2014   CLINICAL DATA:  Right leg stump evaluation  EXAM: RIGHT HIP (WITH PELVIS) 2-3 VIEWS  COMPARISON:  None.  FINDINGS: There is no AP pelvis in this study limiting the exam. The right lower extremity has been amputated in the proximal femur diaphysis. There is a metal intra medullary rod that has also been partially cut. The metallic rod ends at the distal limit of the existing femur. No acute fracture. No dislocation. No destructive bone lesion. Heterotopic calcification is present in the distal stump soft tissues.  There is gas within the soft tissues over the right thigh. This is of unknown significance.  IMPRESSION: Status post amputation above the knee. No obvious destructive bone lesion.  There is gas in the soft tissues in the medial right thigh. Differential diagnosis includes herniated bowel or infection by gas-forming organism.   Electronically Signed   By: [name] [name] [name].D.   On: 11/27/2014 16:19    CBC  Recent Labs Lab 11/25/14 0800 11/27/14 2005 11/28/14 0516 11/29/14 0550 11/30/14 0546  WBC 12.2* 10.9* 12.5* 17.8* 13.2*  HGB 12.8* 12.4* 11.6* 11.1* 10.5*  HCT 39.1 38.7* 35.8* 36.4* 34.2*  PLT 283 291 296 284 247  MCV 95.1 95.8 95.7 96.3 97.4  MCH 31.1 30.7 31.0  29.4 29.9  MCHC 32.7 32.0 32.4 30.5 30.7  RDW 15.7* 16.1* 16.0* 16.1* 16.3*  LYMPHSABS 2.5 2.2  --   --   --   MONOABS 1.3* 1.0  --   --   --   EOSABS 0.1 0.2  --   --   --   BASOSABS 0.0 0.0  --   --   --     Chemistries   Recent Labs Lab 11/25/14 0800 11/27/14 2005 11/28/14 0516 11/29/14 0550 11/30/14 0546  NA 145 143 144 144 147*  K 3.7 3.8 3.9 3.8 3.5  CL 112 108 113* 111 114*  CO2 26 28 26 25 27   GLUCOSE 98 113* 96 100* 85  BUN 23 21 18 21 23   CREATININE 0.77 0.91 0.86 0.99 0.90  CALCIUM 8.8 8.8 8.5 8.3* 8.2*  AST 30 30  --   --   --   ALT 24 22  --   --   --   ALKPHOS 74 69  --   --   --   BILITOT 0.3 0.4  --    --   --    ------------------------------------------------------------------------------------------------------------------ estimated creatinine clearance is 71.3 mL/min (by C-G formula based on Cr of 0.9). ------------------------------------------------------------------------------------------------------------------  Recent Labs  11/28/14 0516  HGBA1C 5.7*   ------------------------------------------------------------------------------------------------------------------ No results for input(s): CHOL, HDL, LDLCALC, TRIG, CHOLHDL, LDLDIRECT in the last 72 hours. ------------------------------------------------------------------------------------------------------------------ No results for input(s): TSH, T4TOTAL, T3FREE, THYROIDAB in the last 72 hours.  Invalid input(s): FREET3 ------------------------------------------------------------------------------------------------------------------ No results for input(s): VITAMINB12, FOLATE, FERRITIN, TIBC, IRON, RETICCTPCT in the last 72 hours.  Coagulation profile  Recent Labs Lab 11/28/14 0827 11/29/14 0550 11/30/14 0546  INR 5.88* 4.33* 5.12*    No results for input(s): DDIMER in the last 72 hours.  Cardiac Enzymes No results for input(s): CKMB, TROPONINI, MYOGLOBIN in the last 168 hours.  Invalid input(s): CK ------------------------------------------------------------------------------------------------------------------ Invalid input(s): POCBNP    Myshawn Chiriboga D.O. on 11/30/2014 at 12:28 PM  Between 7am to 7pm - Pager - [phone]  After 7pm go to [email or web address] - password [email or web address]  And look for the night coverage person covering for me after hours  [facility] Office  [phone]

## 2014-11-30 NOTE — Progress Notes (Signed)
CSW met with pt and daughter, Isa Rankin at bedside. Discussed d/c plan further. Pt was very difficult to understand, but was clear that he did not want to return to [facility]. Beyond that, he wanted to leave it up to his children. Suzette agreed to look at [facility] and [facility]. She also spoke with her sister who requested [facility]. CSW agreed to send referral to these facilities and will follow up with bed offers when received.   Benay Pike, [place]

## 2014-12-01 ENCOUNTER — Other Ambulatory Visit: Payer: Self-pay

## 2014-12-01 DIAGNOSIS — A419 Sepsis, unspecified organism: Secondary | ICD-10-CM | POA: Diagnosis present

## 2014-12-01 DIAGNOSIS — L89154 Pressure ulcer of sacral region, stage 4: Secondary | ICD-10-CM

## 2014-12-01 LAB — VANCOMYCIN, TROUGH: VANCOMYCIN TR: 30 ug/mL — AB (ref 10.0–20.0)

## 2014-12-01 LAB — RESPIRATORY VIRUS PANEL
Adenovirus: NEGATIVE
INFLUENZA A: NEGATIVE
INFLUENZA B 1: NEGATIVE
Metapneumovirus: NEGATIVE
PARAINFLUENZA 1 A: NEGATIVE
PARAINFLUENZA 2 A: NEGATIVE
Parainfluenza 3: NEGATIVE
RESPIRATORY SYNCYTIAL VIRUS B: NEGATIVE
Respiratory Syncytial Virus A: NEGATIVE
Rhinovirus: NEGATIVE

## 2014-12-01 LAB — BASIC METABOLIC PANEL
ANION GAP: 6 (ref 5–15)
BUN: 18 mg/dL (ref 6–23)
CHLORIDE: 111 mmol/L (ref 96–112)
CO2: 25 mmol/L (ref 19–32)
Calcium: 8.3 mg/dL — ABNORMAL LOW (ref 8.4–10.5)
Creatinine, Ser: 0.73 mg/dL (ref 0.50–1.35)
GFR calc Af Amer: >90 mL/min (ref >90)
GFR calc non Af Amer: 90 mL/min — ABNORMAL LOW (ref >90)
Glucose, Bld: 102 mg/dL — ABNORMAL HIGH (ref 70–99)
Potassium: 3.4 mmol/L — ABNORMAL LOW (ref 3.5–5.1)
SODIUM: 142 mmol/L (ref 135–145)

## 2014-12-01 LAB — GLUCOSE, CAPILLARY
Glucose-Capillary: 105 mg/dL — ABNORMAL HIGH (ref 70–99)
Glucose-Capillary: 108 mg/dL — ABNORMAL HIGH (ref 70–99)
Glucose-Capillary: 113 mg/dL — ABNORMAL HIGH (ref 70–99)
Glucose-Capillary: 118 mg/dL — ABNORMAL HIGH (ref 70–99)
Glucose-Capillary: 129 mg/dL — ABNORMAL HIGH (ref 70–99)

## 2014-12-01 LAB — PROTIME-INR
INR: 4.57 — ABNORMAL HIGH (ref 0.00–1.49)
PROTHROMBIN TIME: 43.6 s — AB (ref 11.6–15.2)

## 2014-12-01 MED ORDER — VANCOMYCIN HCL 10 G IV SOLR
1250.0000 mg | INTRAVENOUS | Status: DC
  Administered 2014-12-01 – 2014-12-02 (×2): 1250 mg via INTRAVENOUS
  Filled 2014-12-01 (×2): qty 1250

## 2014-12-01 MED ORDER — OXYCODONE HCL 5 MG PO TABS: ORAL_TABLET | ORAL | Status: DC

## 2014-12-01 MED ORDER — HYDROCHLOROTHIAZIDE 12.5 MG PO CAPS
12.5000 mg | ORAL_CAPSULE | Freq: Every day | ORAL | Status: DC
  Administered 2014-12-01 – 2014-12-03 (×4): 12.5 mg via ORAL
  Filled 2014-12-01 (×3): qty 1

## 2014-12-01 MED ORDER — HALOPERIDOL LACTATE 5 MG/ML IJ SOLN: 2.0000 mg | Freq: Four times a day (QID) | INTRAMUSCULAR | Status: DC | PRN
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
## 2014-12-01 NOTE — Clinical Social Work Note (Addendum)
[facility] Facility notified. Awaiting stability for d/c. CSW will continue to follow. With daughter's permission, CS[facility] that [facility] will not be returning.   Derenda FennelKara Taresa [place], LCSW [phone]

## 2014-12-01 NOTE — Telephone Encounter (Signed)
RX faxed to [facility] @ [phone]. Phone number [phone]  

## 2014-12-01 NOTE — Progress Notes (Signed)
[facility] PROGRESS NOTE Interim History: 74 y.o. male with a history of CVA , Dysphagia and Right hemiparesis, HTN DM2, Hyperlipidemia Chronic Anticoagulation due to PE/DVT Recent AKA of RLE, and chronic Sacral Decubitus Ulcer who was sent from the [facility] SNF due to an infection of his Sacral decubitus ulcer after results of his wound culture returned positive for MRSA.   Assessment/Plan: Sepsis due to Wound infection Sacral decubitus ulcer - Culture at the facility grew MRSA positive and no hole sensitive for reliable culture from the wound would be. - CT scan of the abdomen and pelvis was done that shows soft tissue wound on the right buttock with air tracking no erosion suggestive of osteomyelitis. - He was started on IV vancomycin and clindamycin, wound care was consulted, to set of blood cultures on the 23rd and 25th has been negative till date. - Appreciate general surgery assisted's they recommended no surgical intervention at this point. - Wound care was consulted they recommended a feeling with plain gauze packing and Dakin's solution to the dressing changes.  Subacute C7 cervical fracture: Has remained stable repeated C-spine CT on 11/25/2014 show no change compared to 11/06/2014. Continue cervical collar.  History of DVT/PE: Continue Coumadin, INR supratherapeutic but is slowly drifting down. - There is a mild drop in hemoglobin continue to monitor.  Abdominal aortic aneurysm: CT of the abdomen so an aneurysm of 4.5 cm 4.0 cm only to follow-up CTA in 6 months. Follow-up with surgery as an outpatient.  Chronic kidney disease, stage 3: Baseline creatinine 0.9.  Protein-calorie malnutrition, severe - Nutrition consult continue feedings mentation.  Essential hypertension - Blood pressure not at goal, add low dose HCTZ.  Diabetes mellitus without complication: - A1c was 5.7, 3 in the hospital blood sugar has been ranging from 80 to 130 with 1 or 2 units of  insulin as needed.  Code Status: DNR Family Communication: Daughter at bedside. Disposition Plan: Admitted, continue IV antibiotics. If continues to improve, possible discharge within next 24-48hours  Consultants:  surgery  Procedures:  Ct abd   Antibiotics:  Vanc and clindamycin 4.23.2016  HPI/Subjective: Complaining of buttock pain.  Objective: Filed Vitals:   11/30/14 0430 11/30/14 1306 11/30/14 2212 12/01/14 0252  BP: 110/73 122/56 128/70 157/76  Pulse: 74 76 82 75  Temp: 98.2 F (36.8 C) 98 F (36.7 C) 98.5 F (36.9 C) 99 F (37.2 C)  TempSrc: Oral Oral Oral Oral  Resp: Height:      Weight:      SpO2: 97% 97% 96% 98%    Intake/Output Summary (Last 24 hours) at 12/01/14 0959 Last data filed at 12/01/14 0600  Gross per 24 hour  Intake 3962.25 ml  Output   1250 ml  Net 2712.25 ml   Filed Weights   11/27/14 2012 11/27/14 2326  Weight: 78.019 kg (172 lb) 69 kg (152 lb 1.9 oz)    Exam:  General: Alert, awake, oriented x3, in no acute distress.  HEENT: No bruits, no goiter.  Heart: Regular rate and rhythm. Lungs: Good air movement, clear Abdomen: Soft, nontender, nondistended, positive bowel sounds.  Neuro: Grossly intact, nonfocal.   Data Reviewed: Basic Metabolic Panel:  Recent Labs Lab 11/27/14 2005 11/28/14 0516 11/29/14 0550 11/30/14 0546 12/01/14 0612  NA 143 144 144 147* 142  K 3.8 3.9 3.8 3.5 3.4*  CL 108 113* 111 114* 111  CO2 GLUCOSE 113* 96 100* 85 102*  BUN 21 18 21 23 18   CREATININE 0.91 0.86 0.99 0.90 0.73  CALCIUM 8.8 8.5 8.3* 8.2* 8.3*   Liver Function Tests:  Recent Labs Lab 11/25/14 0800 11/27/14 2005  AST 30 30  ALT 24 22  ALKPHOS 74 69  BILITOT 0.3 0.4  PROT 7.3 7.3  ALBUMIN 2.3* 2.2*   No results for input(s): LIPASE, AMYLASE in the last 168 hours. No results for input(s): AMMONIA in the last 168 hours. CBC:  Recent Labs Lab 11/25/14 0800 11/27/14 2005 11/28/14 0516  11/29/14 0550 11/30/14 0546  WBC 12.2* 10.9* 12.5* 17.8* 13.2*  NEUTROABS 8.2* 7.6  --   --   --   HGB 12.8* 12.4* 11.6* 11.1* 10.5*  HCT 39.1 38.7* 35.8* 36.4* 34.2*  MCV 95.1 95.8 95.7 96.3 97.4  PLT 283 291 296 284 247   Cardiac Enzymes: No results for input(s): CKTOTAL, CKMB, CKMBINDEX, TROPONINI in the last 168 hours. BNP (last 3 results)  Recent Labs  10/05/14 1503  BNP 165.0*    ProBNP (last 3 results) No results for input(s): PROBNP in the last 8760 hours.  CBG:  Recent Labs Lab 11/30/14 1151 11/30/14 1714 11/30/14 2029 12/01/14 0014 12/01/14 0454  GLUCAP 135* 146* 115* 118* 108*    Recent Results (from the past 240 hour(s))  Wound culture     Status: None   Collection Time: 11/24/14 10:00 AM  Result Value Ref Range Status   Specimen Description WOUND SACRAL  Final   Special Requests NONE  Final   Gram Stain   Final    ABUNDANT WBC PRESENT,BOTH PMN AND MONONUCLEAR NO SQUAMOUS EPITHELIAL CELLS SEEN ABUNDANT GRAM POSITIVE COCCI IN PAIRS ABUNDANT GRAM NEGATIVE RODS Performed at [facility]    Culture   Final    RARE METHICILLIN RESISTANT STAPHYLOCOCCUS AUREUS Note: RIFAMPIN AND GENTAMICIN SHOULD NOT BE USED AS SINGLE DRUGS FOR TREATMENT OF STAPH INFECTIONS. This organism is presumed to be Clindamycin resistant based on detection of inducible Clindamycin resistance. CRITICAL RESULT CALLED TO, READ BACK BY AND  VERIFIED WITH: MICHELLE M. AT 10:30AM ON 11/27/14 HAJAM REPORT FAXED BY REQUEST Performed at [facility]    Report Status 11/27/2014 FINAL  Final   Organism ID, Bacteria METHICILLIN RESISTANT STAPHYLOCOCCUS AUREUS  Final      Susceptibility   Methicillin resistant staphylococcus aureus - MIC*    CLINDAMYCIN RESISTANT      ERYTHROMYCIN >=8 RESISTANT Resistant     GENTAMICIN <=0.5 SENSITIVE Sensitive     LEVOFLOXACIN >=8 RESISTANT Resistant     OXACILLIN >=4 RESISTANT Resistant     PENICILLIN >=0.5 RESISTANT Resistant      RIFAMPIN <=0.5 SENSITIVE Sensitive     TRIMETH/SULFA <=10 SENSITIVE Sensitive     VANCOMYCIN 1 SENSITIVE Sensitive     TETRACYCLINE <=1 SENSITIVE Sensitive     * RARE METHICILLIN RESISTANT STAPHYLOCOCCUS AUREUS  Culture, blood (routine x 2)     Status: None (Preliminary result)   Collection Time: 11/27/14  8:19 PM  Result Value Ref Range Status   Specimen Description BLOOD LEFT ARM  Final   Special Requests BOTTLES DRAWN AEROBIC AND ANAEROBIC 6CC  Final   Culture NO GROWTH 3 DAYS  Final   Report Status PENDING  Incomplete  Culture, blood (routine x 2)     Status: None (Preliminary result)   Collection Time: 11/27/14  8:23 PM  Result Value Ref Range Status   Specimen Description BLOOD LEFT HAND  Final   Special Requests BOTTLES  DRAWN AEROBIC AND ANAEROBIC 6CC  Final   Culture NO GROWTH 3 DAYS  Final   Report Status PENDING  Incomplete  MRSA PCR Screening     Status: Abnormal   Collection Time: 11/28/14  5:55 AM  Result Value Ref Range Status   MRSA by PCR POSITIVE (A) NEGATIVE Final    Comment:        The GeneXpert MRSA Assay (FDA approved for NASAL specimens only), is one component of a comprehensive MRSA colonization surveillance program. It is not intended to diagnose MRSA infection nor to guide or monitor treatment for MRSA infections. RESULT CALLED TO, READ BACK BY AND VERIFIED WITH: J.MAYS AT 0916 ON 11/28/14 BY S.VANHOORNE   Culture, Urine     Status: None   Collection Time: 11/28/14 10:45 AM  Result Value Ref Range Status   Specimen Description URINE, CATHETERIZED  Final   Special Requests VANCOMYCIN AND ZOSYN  Final   Colony Count NO GROWTH Performed at [facility]   Final   Culture NO GROWTH Performed at [facility]   Final   Report Status 11/30/2014 FINAL  Final  Culture, blood (routine x 2)     Status: None (Preliminary result)   Collection Time: 11/29/14  1:52 PM  Result Value Ref Range Status   Specimen Description BLOOD  Final    Special Requests NONE  Final   Culture NO GROWTH 1 DAY  Final   Report Status PENDING  Incomplete  Culture, blood (routine x 2)     Status: None (Preliminary result)   Collection Time: 11/29/14  2:01 PM  Result Value Ref Range Status   Specimen Description BLOOD  Final   Special Requests NONE  Final   Culture NO GROWTH 1 DAY  Final   Report Status PENDING  Incomplete     Studies: No results found.  Scheduled Meds: . Chlorhexidine Gluconate Cloth  6 each Topical Q0600  . citalopram  20 mg Oral Daily  . clindamycin (CLEOCIN) IV  900 mg Intravenous 3 times per day  . feeding supplement (ENSURE ENLIVE)  237 mL Oral BID BM  . feeding supplement (PRO-STAT SUGAR FREE 64)  30 mL Oral BID BM  . insulin aspart  0-9 Units Subcutaneous 6 times per day  . isosorbide mononitrate  30 mg Oral Daily  . mupirocin ointment  1 application Nasal BID  . OxyCODONE  10 mg Oral Q12H  . pravastatin  80 mg Oral QPM  . sodium hypochlorite   Irrigation BID  . tamsulosin  0.4 mg Oral Daily  . vancomycin  1,000 mg Intravenous Q12H  . Vitamin D (Ergocalciferol)  50,000 Units Oral Q7 days  . Warfarin - Pharmacist Dosing Inpatient   Does not apply Q24H   Continuous Infusions: . sodium chloride 75 mL/hr at 12/01/14 0600    Time Spent: 25 min   Marinda Elk  [facility] Pager [phone]. If 7PM-7AM, please contact night-coverage at [email or web address], password [facility] 12/01/2014, 9:59 AM  LOS: 4 days

## 2014-12-01 NOTE — Evaluation (Signed)
Physical Therapy Evaluation Patient Details Name: [name] [name] MRN: [id number] DOB: 1941-02-10 Today's Date: 12/01/2014   History of Present Illness  [name] [name] is a 73 y.o. male with a history of CVA , Dysphagia and Right hemiparesis, HTN DM2, Hyperlipidemia Chronic Anticoagulation due to PE/DVT Recent AKA of RLE, and chronic Sacral Decubitus Ulcer who was sent from the [facility] SNF due to an infection of his Sacral decubitus ulcer after results of his wound culture returned positive for MRSA. A Ct scan of the ABD Pelvis was performed and revealed soft tissue inflammation and gas tracking but no definitive osteomyelitis of the sacral wound. He was started on IV Vancomycin and Zosyn and referred for admission.   Clinical Impression  Pt was seen for evaluation.  He has many medical problems and his recent  functional ability is not known.  He has been at the SNF.  He is wearing a cervical collar due to a recent cervical compression fx and is being treated for an infected decubitus.  Pt has significant generalized weakness and all mobility is limited by severe sacral pain with any movement in the bed.  Until he can work on rolling and progression to sitting in the bed, PT is not going to be very effective.  Once his wound is improved he might be able to begin PT at that point.    Follow Up Recommendations No PT follow up    Equipment Recommendations  None recommended by PT    Recommendations for Other Services   none    Precautions / Restrictions Precautions Precautions: Fall Precaution Comments: contact precautions Restrictions Weight Bearing Restrictions: No      Mobility  Bed Mobility Overal bed mobility: Needs Assistance Bed Mobility: Rolling Rolling: Max assist         General bed mobility comments: pt is able to reach for siderails to assist in transfer but has severe pain in his sacrum with any movement in the bed  Transfers                 General transfer  comment: total dependence  Ambulation/Gait             General Gait Details: non ambulatory  Stairs            Wheelchair Mobility    Modified Rankin (Stroke Patients Only)       Balance Overall balance assessment:  (N/A)                                           Pertinent Vitals/Pain Pain Assessment: No/denies pain (at rest)    Home Living Family/patient expects to be discharged to:: Skilled nursing facility                      Prior Function           Comments: unknown...pt has a severe sacral decubitus which indicates minimal mobility     Hand Dominance   Dominant Hand: Left    Extremity/Trunk Assessment               Lower Extremity Assessment: Generalized weakness;RLE deficits/detail RLE Deficits / Details: R AKA       Communication   Communication: Expressive difficulties (dysarthria from CVA)  Cognition Arousal/Alertness: Awake/alert Behavior During Therapy: Flat affect Overall Cognitive Status: Difficult to assess  General Comments      Exercises        Assessment/Plan    PT Assessment Patent does not need any further PT services  PT Diagnosis     PT Problem List    PT Treatment Interventions     PT Goals (Current goals can be found in the Care Plan section) Acute Rehab PT Goals PT Goal Formulation: All assessment and education complete, DC therapy    Frequency     Barriers to discharge        Co-evaluation               End of Session   Activity Tolerance: Patient limited by pain Patient left: in bed;with call bell/phone within reach           Time: [id number] PT Time Calculation (min) (ACUTE ONLY): 30 min   Charges:   PT Evaluation $Initial PT Evaluation Tier I: 1 Procedure     PT G CodesMyrlene Broker:        Griselda Bramblett L 12/01/2014, 3:03 PM

## 2014-12-01 NOTE — Clinical Social Work Placement (Signed)
   CLINICAL SOCIAL WORK PLACEMENT  NOTE  Date:  12/01/2014  Patient Details  Name: [name] [name] MRN: [id number] Date of Birth: Apr 18, 1941  Clinical Social Work is seeking post-discharge placement for this patient at the Skilled  Nursing Facility level of care (*CSW will initial, date and re-position this form in  chart as items are completed):  Yes   Patient/family provided with [facility]'s list of facilities offering this level of care within the geographic area requested by the patient (or if unable, by the patient's family).  Yes   Patient/family informed of their freedom to choose among providers that offer the needed level of care, that participate in Medicare, Medicaid or managed care program needed by the patient, have an available bed and are willing to accept the patient.  Yes   Patient/family informed of 's ownership interest in [facility] and [facility], as well as of the fact that they are under no obligation to receive care at these facilities.  PASRR submitted to EDS on       PASRR number received on       Existing PASRR number confirmed on 11/30/14     FL2 transmitted to all facilities in geographic area requested by pt/family on 11/30/14     FL2 transmitted to all facilities within larger geographic area on       Patient informed that his/her managed care company has contracts with or will negotiate with certain facilities, including the following:            Patient/family informed of bed offers received.  [facility]     Physician recommends and patient chooses bed at      Patient to be transferred to   on  .  Patient to be transferred to facility by       Patient family notified on   of transfer.  Name of family member notified:        PHYSICIAN       Additional Comment:    _______________________________________________ Karn CassisStultz, Lisl Slingerland [place], LCSW 12/01/2014,  11:02 AM
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
## 2014-12-01 NOTE — Progress Notes (Signed)
Pt removed cervical collar on his own and refuses to let us put it back on. We tried several times to get him to put it back on but he refused. He c/o pain to his neck, which is why he took the collar off. Pain med given and will attempt to re-place collar in a bit

## 2014-12-02 DIAGNOSIS — T798XXA Other early complications of trauma, initial encounter: Secondary | ICD-10-CM

## 2014-12-02 LAB — CBC
HCT: 33.4 % — ABNORMAL LOW (ref 39.0–52.0)
Hemoglobin: 10.5 g/dL — ABNORMAL LOW (ref 13.0–17.0)
MCH: 29.9 pg (ref 26.0–34.0)
MCHC: 31.4 g/dL (ref 30.0–36.0)
MCV: 95.2 fL (ref 78.0–100.0)
PLATELETS: 281 10*3/uL (ref 150–400)
RBC: 3.51 MIL/uL — ABNORMAL LOW (ref 4.22–5.81)
RDW: 15.7 % — ABNORMAL HIGH (ref 11.5–15.5)
WBC: 10.3 10*3/uL (ref 4.0–10.5)

## 2014-12-02 LAB — CULTURE, BLOOD (ROUTINE X 2)
CULTURE: NO GROWTH
Culture: NO GROWTH

## 2014-12-02 LAB — GLUCOSE, CAPILLARY
GLUCOSE-CAPILLARY: 116 mg/dL — AB (ref 70–99)
Glucose-Capillary: 107 mg/dL — ABNORMAL HIGH (ref 70–99)
Glucose-Capillary: 100 mg/dL — ABNORMAL HIGH (ref 70–99)
Glucose-Capillary: 97 mg/dL (ref 70–99)
Glucose-Capillary: 121 mg/dL — ABNORMAL HIGH (ref 70–99)

## 2014-12-02 LAB — PROTIME-INR
INR: 2.63 — ABNORMAL HIGH (ref 0.00–1.49)
Prothrombin Time: 28.4 seconds — ABNORMAL HIGH (ref 11.6–15.2)

## 2014-12-02 MED ORDER — DOXYCYCLINE HYCLATE 100 MG PO TABS
100.0000 mg | ORAL_TABLET | Freq: Two times a day (BID) | ORAL | Status: DC
  Administered 2014-12-02 – 2014-12-03 (×3): 100 mg via ORAL
  Filled 2014-12-02 (×3): qty 1

## 2014-12-02 MED ORDER — WARFARIN SODIUM 5 MG PO TABS
2.5000 mg | ORAL_TABLET | Freq: Once | ORAL | Status: AC
  Administered 2014-12-02: 2.5 mg via ORAL
  Filled 2014-12-02: qty 1

## 2014-12-02 MED ORDER — POTASSIUM CHLORIDE CRYS ER 20 MEQ PO TBCR
30.0000 meq | EXTENDED_RELEASE_TABLET | Freq: Two times a day (BID) | ORAL | Status: AC
  Administered 2014-12-02 (×2): 30 meq via ORAL
  Filled 2014-12-02 (×4): qty 1
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
## 2014-12-02 NOTE — Progress Notes (Signed)
[facility] PROGRESS NOTE Interim History: 74 y.o. male with a history of CVA , Dysphagia and Right hemiparesis, HTN DM2, Hyperlipidemia Chronic Anticoagulation due to PE/DVT Recent AKA of RLE, and chronic Sacral Decubitus Ulcer who was sent from the [facility] due to an infection of his Sacral decubitus ulcer after results of his wound culture returned positive for MRSA.   Assessment/Plan: Sepsis due to Wound infection Sacral decubitus ulcer - Culture at the facility grew MRSA positive, sensitive to Bactrim and digoxin. - Started on IV vancomycin wound care was consulted, 2 set's of blood cultures on the 23rd and 25th has been negative till date. - I will De-escalate his antibiotic regimen to Doxycycline. - Wound care was consulted they recommended a feeling with plain gauze packing and Dakin's solution to the dressing changes. - Probably go to skilled nursing facility.  Subacute C7 cervical fracture: - patient remove c-collar, does not want to wear it. - Has remained stable repeated C-spine CT on 11/25/2014 show no change compared to 11/06/2014. Continue cervical collar.  History of DVT/PE: - Continue Coumadin per pharmacy, INR therapeutic.  Abdominal aortic aneurysm: CT of the abdomen so an aneurysm of 4.5 cm 4.0 cm only to follow-up CTA in 6 months. Follow-up with surgery as an outpatient.  Chronic kidney disease, stage 3: Baseline creatinine 0.9.  Protein-calorie malnutrition, severe - Nutrition consult continue feedings mentation.  Essential hypertension - Blood pressure not at goal, add low dose HCTZ.  Diabetes mellitus without complication: - A1c was 5.7, 3 in the hospital blood sugar has been ranging from 80 to 130 with 1 or 2 units of insulin as needed.  Code Status: DNR Family Communication: Daughter at bedside. Disposition Plan:  possible discharge within next 24 hours  Consultants:  surgery  Procedures:  Ct abd   Antibiotics:  Vanc and  clindamycin 4.23.2016  HPI/Subjective: No complains today.  Objective: Filed Vitals:   12/01/14 0451 12/01/14 1515 12/01/14 2245 12/02/14 0700  BP: 157/80 122/72 154/73 173/93  Pulse: 97 79 80 80  Temp: 98.9 F (37.2 C) 98.6 F (37 C) 98.3 F (36.8 C) 98.2 F (36.8 C)  TempSrc: Oral Oral Oral Oral  Resp:  18 18 18   Height:      Weight:      SpO2: 99% 98% 98% 99%    Intake/Output Summary (Last 24 hours) at 12/02/14 0932 Last data filed at 12/01/14 1730  Gross per 24 hour  Intake   1545 ml  Output    400 ml  Net   1145 ml   Filed Weights   11/27/14 2012 11/27/14 2326  Weight: 78.019 kg (172 lb) 69 kg (152 lb 1.9 oz)    Exam:  General: Alert, awake, oriented x3, in no acute distress.  HEENT: No bruits, no goiter.  Heart: Regular rate and rhythm. Lungs: Good air movement, clear Abdomen: Soft, nontender, nondistended, positive bowel sounds.  Neuro: Grossly intact, nonfocal.   Data Reviewed: Basic Metabolic Panel:  Recent Labs Lab 11/27/14 2005 11/28/14 0516 11/29/14 0550 11/30/14 0546 12/01/14 0612  NA 143 144 144 147* 142  K 3.8 3.9 3.8 3.5 3.4*  CL 108 113* 111 114* 111  CO2 28 26 25 27 25   GLUCOSE 113* 96 100* 85 102*  BUN 21 18 21 23 18   CREATININE 0.91 0.86 0.99 0.90 0.73  CALCIUM 8.8 8.5 8.3* 8.2* 8.3*   Liver Function Tests:  Recent Labs Lab 11/27/14 2005  AST 30  ALT 22  ALKPHOS  69  BILITOT 0.4  PROT 7.3  ALBUMIN 2.2*   No results for input(s): LIPASE, AMYLASE in the last 168 hours. No results for input(s): AMMONIA in the last 168 hours. CBC:  Recent Labs Lab 11/27/14 2005 11/28/14 0516 11/29/14 0550 11/30/14 0546  WBC 10.9* 12.5* 17.8* 13.2*  NEUTROABS 7.6  --   --   --   HGB 12.4* 11.6* 11.1* 10.5*  HCT 38.7* 35.8* 36.4* 34.2*  MCV 95.8 95.7 96.3 97.4  PLT 291 296 284 247   Cardiac Enzymes: No results for input(s): CKTOTAL, CKMB, CKMBINDEX, TROPONINI in the last 168 hours. BNP (last 3 results)  Recent Labs   10/05/14 1503  BNP 165.0*    ProBNP (last 3 results) No results for input(s): PROBNP in the last 8760 hours.  CBG:  Recent Labs Lab 12/01/14 0454 12/01/14 1103 12/01/14 1718 12/01/14 2250 12/02/14 0804  GLUCAP 108* 113* 129* 105* 107*    Recent Results (from the past 240 hour(s))  Wound culture     Status: None   Collection Time: 11/24/14 10:00 AM  Result Value Ref Range Status   Specimen Description WOUND SACRAL  Final   Special Requests NONE  Final   Gram Stain   Final    ABUNDANT WBC PRESENT,BOTH PMN AND MONONUCLEAR NO SQUAMOUS EPITHELIAL CELLS SEEN ABUNDANT GRAM POSITIVE COCCI IN PAIRS ABUNDANT GRAM NEGATIVE RODS Performed at [facility]    Culture   Final    RARE METHICILLIN RESISTANT STAPHYLOCOCCUS AUREUS Note: RIFAMPIN AND GENTAMICIN SHOULD NOT BE USED AS SINGLE DRUGS FOR TREATMENT OF STAPH INFECTIONS. This organism is presumed to be Clindamycin resistant based on detection of inducible Clindamycin resistance. CRITICAL RESULT CALLED TO, READ BACK BY AND  VERIFIED WITH: MICHELLE M. AT 10:30AM ON 11/27/14 HAJAM REPORT FAXED BY REQUEST Performed at [facility]    Report Status 11/27/2014 FINAL  Final   Organism ID, Bacteria METHICILLIN RESISTANT STAPHYLOCOCCUS AUREUS  Final      Susceptibility   Methicillin resistant staphylococcus aureus - MIC*    CLINDAMYCIN RESISTANT      ERYTHROMYCIN >=8 RESISTANT Resistant     GENTAMICIN <=0.5 SENSITIVE Sensitive     LEVOFLOXACIN >=8 RESISTANT Resistant     OXACILLIN >=4 RESISTANT Resistant     PENICILLIN >=0.5 RESISTANT Resistant     RIFAMPIN <=0.5 SENSITIVE Sensitive     TRIMETH/SULFA <=10 SENSITIVE Sensitive     VANCOMYCIN 1 SENSITIVE Sensitive     TETRACYCLINE <=1 SENSITIVE Sensitive     * RARE METHICILLIN RESISTANT STAPHYLOCOCCUS AUREUS  Culture, blood (routine x 2)     Status: None (Preliminary result)   Collection Time: 11/27/14  8:19 PM  Result Value Ref Range Status   Specimen Description  BLOOD LEFT ARM  Final   Special Requests BOTTLES DRAWN AEROBIC AND ANAEROBIC 6CC  Final   Culture NO GROWTH 4 DAYS  Final   Report Status PENDING  Incomplete  Culture, blood (routine x 2)     Status: None (Preliminary result)   Collection Time: 11/27/14  8:23 PM  Result Value Ref Range Status   Specimen Description BLOOD LEFT HAND  Final   Special Requests BOTTLES DRAWN AEROBIC AND ANAEROBIC 6CC  Final   Culture NO GROWTH 4 DAYS  Final   Report Status PENDING  Incomplete  MRSA PCR Screening     Status: Abnormal   Collection Time: 11/28/14  5:55 AM  Result Value Ref Range Status   MRSA by PCR POSITIVE (A) NEGATIVE  Final    Comment:        The GeneXpert MRSA Assay (FDA approved for NASAL specimens only), is one component of a comprehensive MRSA colonization surveillance program. It is not intended to diagnose MRSA infection nor to guide or monitor treatment for MRSA infections. RESULT CALLED TO, READ BACK BY AND VERIFIED WITH: J.MAYS AT 0916 ON 11/28/14 BY S.VANHOORNE   Culture, Urine     Status: None   Collection Time: 11/28/14 10:45 AM  Result Value Ref Range Status   Specimen Description URINE, CATHETERIZED  Final   Special Requests VANCOMYCIN AND ZOSYN  Final   Colony Count NO GROWTH Performed at [facility]   Final   Culture NO GROWTH Performed at [facility]   Final   Report Status 11/30/2014 FINAL  Final  Culture, blood (routine x 2)     Status: None (Preliminary result)   Collection Time: 11/29/14  1:52 PM  Result Value Ref Range Status   Specimen Description BLOOD  Final   Special Requests NONE  Final   Culture NO GROWTH 2 DAYS  Final   Report Status PENDING  Incomplete  Culture, blood (routine x 2)     Status: None (Preliminary result)   Collection Time: 11/29/14  2:01 PM  Result Value Ref Range Status   Specimen Description BLOOD  Final   Special Requests NONE  Final   Culture NO GROWTH 2 DAYS  Final   Report Status PENDING  Incomplete   Respiratory virus panel     Status: None   Collection Time: 11/29/14  3:12 PM  Result Value Ref Range Status   Respiratory Syncytial Virus A Negative Negative Final   Respiratory Syncytial Virus B Negative Negative Final   Influenza A Negative Negative Final   Influenza B Negative Negative Final   Parainfluenza 1 Negative Negative Final   Parainfluenza 2 Negative Negative Final   Parainfluenza 3 Negative Negative Final   Metapneumovirus Negative Negative Final   Rhinovirus Negative Negative Final   Adenovirus Negative Negative Final    Comment: (NOTE) Performed At: [facility] [address] [place], [place] [id number] Mila Homer MD WG:[id number]      Studies: No results found.  Scheduled Meds: . Chlorhexidine Gluconate Cloth  6 each Topical Q0600  . citalopram  20 mg Oral Daily  . feeding supplement (ENSURE ENLIVE)  237 mL Oral BID BM  . feeding supplement (PRO-STAT SUGAR FREE 64)  30 mL Oral BID BM  . hydrochlorothiazide  12.5 mg Oral Daily  . insulin aspart  0-9 Units Subcutaneous 6 times per day  . isosorbide mononitrate  30 mg Oral Daily  . mupirocin ointment  1 application Nasal BID  . OxyCODONE  10 mg Oral Q12H  . pravastatin  80 mg Oral QPM  . sodium hypochlorite   Irrigation BID  . tamsulosin  0.4 mg Oral Daily  . vancomycin  1,250 mg Intravenous Q24H  . Vitamin D (Ergocalciferol)  50,000 Units Oral Q7 days  . warfarin  2.5 mg Oral Once  . Warfarin - Pharmacist Dosing Inpatient   Does not apply Q24H   Continuous Infusions: . sodium chloride 75 mL/hr at 12/02/14 7846    Time Spent: 25 min   Marinda Elk  [facility] Pager [phone]. If 7PM-7AM, please contact night-coverage at [email or web address], password [facility] 12/02/2014, 9:32 AM  LOS: 5 days

## 2014-12-03 DIAGNOSIS — R6521 Severe sepsis with septic shock: Secondary | ICD-10-CM | POA: Diagnosis not present

## 2014-12-03 DIAGNOSIS — I714 Abdominal aortic aneurysm, without rupture: Secondary | ICD-10-CM | POA: Diagnosis not present

## 2014-12-03 DIAGNOSIS — I671 Cerebral aneurysm, nonruptured: Secondary | ICD-10-CM | POA: Diagnosis not present

## 2014-12-03 DIAGNOSIS — G92 Toxic encephalopathy: Secondary | ICD-10-CM | POA: Diagnosis not present

## 2014-12-03 DIAGNOSIS — Z7901 Long term (current) use of anticoagulants: Secondary | ICD-10-CM | POA: Diagnosis not present

## 2014-12-03 DIAGNOSIS — Z89611 Acquired absence of right leg above knee: Secondary | ICD-10-CM | POA: Diagnosis not present

## 2014-12-03 DIAGNOSIS — L89150 Pressure ulcer of sacral region, unstageable: Secondary | ICD-10-CM | POA: Diagnosis not present

## 2014-12-03 DIAGNOSIS — I739 Peripheral vascular disease, unspecified: Secondary | ICD-10-CM | POA: Diagnosis not present

## 2014-12-03 DIAGNOSIS — M159 Polyosteoarthritis, unspecified: Secondary | ICD-10-CM | POA: Diagnosis not present

## 2014-12-03 DIAGNOSIS — R791 Abnormal coagulation profile: Secondary | ICD-10-CM | POA: Diagnosis not present

## 2014-12-03 DIAGNOSIS — Z7401 Bed confinement status: Secondary | ICD-10-CM | POA: Diagnosis not present

## 2014-12-03 DIAGNOSIS — T798XXA Other early complications of trauma, initial encounter: Secondary | ICD-10-CM | POA: Diagnosis not present

## 2014-12-03 DIAGNOSIS — E785 Hyperlipidemia, unspecified: Secondary | ICD-10-CM | POA: Diagnosis not present

## 2014-12-03 DIAGNOSIS — D6959 Other secondary thrombocytopenia: Secondary | ICD-10-CM | POA: Diagnosis not present

## 2014-12-03 DIAGNOSIS — N401 Enlarged prostate with lower urinary tract symptoms: Secondary | ICD-10-CM | POA: Diagnosis not present

## 2014-12-03 DIAGNOSIS — I2782 Chronic pulmonary embolism: Secondary | ICD-10-CM | POA: Diagnosis not present

## 2014-12-03 DIAGNOSIS — I2510 Atherosclerotic heart disease of native coronary artery without angina pectoris: Secondary | ICD-10-CM | POA: Diagnosis not present

## 2014-12-03 DIAGNOSIS — Z8673 Personal history of transient ischemic attack (TIA), and cerebral infarction without residual deficits: Secondary | ICD-10-CM | POA: Diagnosis not present

## 2014-12-03 DIAGNOSIS — I10 Essential (primary) hypertension: Secondary | ICD-10-CM | POA: Diagnosis not present

## 2014-12-03 DIAGNOSIS — E559 Vitamin D deficiency, unspecified: Secondary | ICD-10-CM | POA: Diagnosis not present

## 2014-12-03 DIAGNOSIS — N183 Chronic kidney disease, stage 3 (moderate): Secondary | ICD-10-CM | POA: Diagnosis not present

## 2014-12-03 DIAGNOSIS — M6281 Muscle weakness (generalized): Secondary | ICD-10-CM | POA: Diagnosis not present

## 2014-12-03 DIAGNOSIS — G9340 Encephalopathy, unspecified: Secondary | ICD-10-CM | POA: Diagnosis not present

## 2014-12-03 DIAGNOSIS — D62 Acute posthemorrhagic anemia: Secondary | ICD-10-CM | POA: Diagnosis not present

## 2014-12-03 DIAGNOSIS — I6350 Cerebral infarction due to unspecified occlusion or stenosis of unspecified cerebral artery: Secondary | ICD-10-CM | POA: Diagnosis not present

## 2014-12-03 DIAGNOSIS — Z86711 Personal history of pulmonary embolism: Secondary | ICD-10-CM | POA: Diagnosis not present

## 2014-12-03 DIAGNOSIS — L8994 Pressure ulcer of unspecified site, stage 4: Secondary | ICD-10-CM | POA: Diagnosis not present

## 2014-12-03 DIAGNOSIS — R278 Other lack of coordination: Secondary | ICD-10-CM | POA: Diagnosis not present

## 2014-12-03 DIAGNOSIS — R279 Unspecified lack of coordination: Secondary | ICD-10-CM | POA: Diagnosis not present

## 2014-12-03 DIAGNOSIS — A4902 Methicillin resistant Staphylococcus aureus infection, unspecified site: Secondary | ICD-10-CM | POA: Diagnosis not present

## 2014-12-03 DIAGNOSIS — Z5181 Encounter for therapeutic drug level monitoring: Secondary | ICD-10-CM | POA: Diagnosis not present

## 2014-12-03 DIAGNOSIS — E119 Type 2 diabetes mellitus without complications: Secondary | ICD-10-CM | POA: Diagnosis not present

## 2014-12-03 DIAGNOSIS — F320 Major depressive disorder, single episode, mild: Secondary | ICD-10-CM | POA: Diagnosis not present

## 2014-12-03 DIAGNOSIS — R531 Weakness: Secondary | ICD-10-CM | POA: Diagnosis not present

## 2014-12-03 DIAGNOSIS — E43 Unspecified severe protein-calorie malnutrition: Secondary | ICD-10-CM | POA: Diagnosis not present

## 2014-12-03 DIAGNOSIS — S12600A Unspecified displaced fracture of seventh cervical vertebra, initial encounter for closed fracture: Secondary | ICD-10-CM | POA: Diagnosis not present

## 2014-12-03 LAB — PROTIME-INR
INR: 1.66 — ABNORMAL HIGH (ref 0.00–1.49)
Prothrombin Time: 19.7 seconds — ABNORMAL HIGH (ref 11.6–15.2)

## 2014-12-03 LAB — BASIC METABOLIC PANEL
ANION GAP: 5 (ref 5–15)
BUN: 13 mg/dL (ref 6–23)
CHLORIDE: 112 mmol/L (ref 96–112)
CO2: 24 mmol/L (ref 19–32)
Calcium: 8.3 mg/dL — ABNORMAL LOW (ref 8.4–10.5)
Creatinine, Ser: 0.67 mg/dL (ref 0.50–1.35)
GFR calc Af Amer: >90 mL/min (ref >90)
Glucose, Bld: 96 mg/dL (ref 70–99)
Potassium: 3.6 mmol/L (ref 3.5–5.1)
Sodium: 141 mmol/L (ref 135–145)

## 2014-12-03 LAB — GLUCOSE, CAPILLARY
GLUCOSE-CAPILLARY: 99 mg/dL (ref 70–99)
GLUCOSE-CAPILLARY: 138 mg/dL — AB (ref 70–99)
GLUCOSE-CAPILLARY: 106 mg/dL — AB (ref 70–99)
Glucose-Capillary: 98 mg/dL (ref 70–99)
Glucose-Capillary: 91 mg/dL (ref 70–99)

## 2014-12-03 MED ORDER — OXYCODONE HCL 5 MG PO TABS: ORAL_TABLET | ORAL | Status: AC

## 2014-12-03 MED ORDER — HYDROCHLOROTHIAZIDE 12.5 MG PO CAPS: 12.5000 mg | ORAL_CAPSULE | Freq: Every day | ORAL | Status: AC

## 2014-12-03 MED ORDER — ENOXAPARIN SODIUM 80 MG/0.8ML ~~LOC~~ SOLN
70.0000 mg | Freq: Two times a day (BID) | SUBCUTANEOUS | Status: DC
  Administered 2014-12-03: 70 mg via SUBCUTANEOUS
  Filled 2014-12-03: qty 0.8

## 2014-12-03 MED ORDER — OXYCODONE HCL 5 MG PO CAPS: 5.0000 mg | ORAL_CAPSULE | ORAL | Status: AC | PRN

## 2014-12-03 MED ORDER — ENOXAPARIN SODIUM 80 MG/0.8ML ~~LOC~~ SOLN: 70.0000 mg | Freq: Two times a day (BID) | SUBCUTANEOUS | Status: AC

## 2014-12-03 MED ORDER — DOXYCYCLINE HYCLATE 100 MG PO TABS: 100.0000 mg | ORAL_TABLET | Freq: Two times a day (BID) | ORAL | Status: AC

## 2014-12-03 MED ORDER — WARFARIN SODIUM 5 MG PO TABS: 5.0000 mg | ORAL_TABLET | Freq: Once | ORAL | Status: DC

## 2014-12-03 NOTE — Progress Notes (Signed)
Report given to [facility] at [facility] [place] who verbalized understanding.

## 2014-12-03 NOTE — Progress Notes (Signed)
Patient in stable condition at discharge.  Report has been called to [facility] and RosaLee verbalized understanding.  [facility].

## 2014-12-03 NOTE — Progress Notes (Signed)
Pt refused to wear cervical collar despite multiple attempts to reapply the collar.  Instructed the pt on the importance of wearing the collar to prevent further damage r/t cervical fracture at C-7.  Will continue to monitor.

## 2014-12-03 NOTE — Clinical Social Work Placement (Signed)
   CLINICAL SOCIAL WORK PLACEMENT  NOTE  Date:  12/03/2014  Patient Details  Name: [name] SawyersDavid T Schlesinger MRN: [id number] Date of Birth: 08-06-1941  Clinical Social Work is seeking post-discharge placement for this patient at the Skilled  Nursing Facility level of care (*CSW will initial, date and re-position this form in  chart as items are completed):  Yes   Patient/family provided with [place] Clinical Social Work Department's list of facilities offering this level of care within the geographic area requested by the patient (or if unable, by the patient's family).  Yes   Patient/family informed of their freedom to choose among providers that offer the needed level of care, that participate in Medicare, Medicaid or managed care program needed by the patient, have an available bed and are willing to accept the patient.  Yes   Patient/family informed of [place]'s ownership interest in [facility] and [facility], as well as of the fact that they are under no obligation to receive care at these facilities.  PASRR submitted to EDS on       PASRR number received on       Existing PASRR number confirmed on 11/30/14     FL2 transmitted to all facilities in geographic area requested by pt/family on 11/30/14     FL2 transmitted to all facilities within larger geographic area on       Patient informed that his/her managed care company has contracts with or will negotiate with certain facilities, including the following:            Patient/family informed of bed offers received.  [facility] at [facility] [place]     Physician recommends and patient chooses bed at      [facility] on 12/03/14.  [facility]     Patient family notified on 12/03/14 of transfer.  Name of family member notified:  [place]- daughter     PHYSICIAN       Additional Comment:     _______________________________________________ Karn CassisStultz, Onofre Gains [place], LCSW 12/03/2014, 11:21 AM [phone]

## 2014-12-03 NOTE — Progress Notes (Signed)
Pt d/c today to [facility]. Pt sleeping at time of visit. Daughter, Hector ShadeSuzette and facility aware and agreeable. She requests transport [facility] about 3:30 so her sister can get to [facility] after work. D/C summary and updated FL2 faxed.   Derenda FennelKara Julyanna Scholle, [place] [phone]
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
## 2014-12-03 NOTE — Discharge Summary (Addendum)
Physician Discharge Summary  [name] [name]:[id number] DOB: 10-Dec-1940 DOA: 11/27/2014  PCP: Cyndee Brightly, MD  Admit date: 11/27/2014 Discharge date: 12/03/2014  Time spent: 30 minutes  Recommendations for Outpatient Follow-up:  1. Skilled nursing facility at [facility]. 2. Palliative care to meet with family for goals of care 3. Continue doxycycline for total 15 days. 4. Continue wound care as instructions below. 5. Monitor INR as an outpatient, goal 2-3.  Discharge Diagnoses:  Principal Problem:   Wound infection Active Problems:   Chronic kidney disease, stage 3   Sacral decubitus ulcer   Essential hypertension   Chronic anticoagulation   CAD (coronary artery disease)   Decubitus ulcer   Subcutaneous air   Protein-calorie malnutrition, severe   C7 cervical fracture   Diabetes mellitus without complication   Hyperlipidemia   Sepsis   Discharge Condition: guarded  Diet recommendation: Dysphagia 1 diet  Filed Weights   11/27/14 2012 11/27/14 2326  Weight: 78.019 kg (172 lb) 69 kg (152 lb 1.9 oz)    History of present illness:  74 y.o. male with a history of CVA , Dysphagia and Right hemiparesis, HTN DM2, Hyperlipidemia Chronic Anticoagulation due to PE/DVT Recent AKA of RLE, and chronic Sacral Decubitus Ulcer who was sent from the [facility] SNF due to an infection of his Sacral decubitus ulcer after results of his wound culture returned positive for MRSA. A Ct scan of the ABD Pelvis was performed and revealed soft tissue inflammation and gas tracking but no definitive osteomyelitis of the sacral wound. He was started on IV Vancomycin and Zosyn and referred for admission.   Hospital Course:  Sepsis due to Wound infection Sacral decubitus ulcer: - On admission he was started on IV bank and Zosyn, this was D escalated to vancomycin and clindamycin. - Culture at the facility grew MRSA positive sensitive to doxycycline and Bactrim. He will continue  doxycycline for a total of 15 days. - Blood cultures continue to be negative till date. - CT scan of the abdomen and pelvis was done that shows soft tissue wound on the right buttock with air tracking no erosion suggestive of osteomyelitis. - Appreciate general surgery assisted's they recommended no surgical intervention at this point. - Wound care was consulted they recommended a feeling with plain gauze packing and Dakin's solution to the dressing changes daily.  Subacute C7 cervical fracture: Has remained stable repeated C-spine CT on 11/25/2014 show no change compared to 11/06/2014. Continue cervical collar. Patient decided not to wear c-collar.  History of DVT/PE: - Continue Coumadin. Due to his INR being subtherapeutic he was started on Lovenox which she will continue as an outpatient. - There is a mild drop in hemoglobin continue to monitor.  Abdominal aortic aneurysm: CT of the abdomen so an aneurysm of 4.5 cm 4.0 cm only to follow-up CTA in 6 months. Follow-up with surgery as an outpatient.  Chronic kidney disease, stage 3: Baseline creatinine 0.9.  Protein-calorie malnutrition, severe - Nutrition consult continue feedings mentation.  Essential hypertension - Blood pressure not at goal, added low dose HCTZ. - Check a be met in a week.  Diabetes mellitus without complication: - E7M was 5.7, I think he will not an insulin as an outpatient.  Code Status: DNR  Procedures:  CT abd and pelvi  Consultations:  Surgery  Discharge Exam: Filed Vitals:   12/03/14 0550  BP: 145/86  Pulse: 85  Temp: 98 F (36.7 C)  Resp: 20    General: A&O x3  Cardiovascular: RRR Respiratory: good air movement CTA B/L  Discharge Instructions   Discharge Instructions    Diet - low sodium heart healthy    Complete by:  As directed      Increase activity slowly    Complete by:  As directed           Current Discharge Medication List    START taking these medications    Details  doxycycline (VIBRA-TABS) 100 MG tablet Take 1 tablet (100 mg total) by mouth every 12 (twelve) hours. Qty: 30 tablet, Refills: 0    enoxaparin (LOVENOX) 80 MG/0.8ML injection Inject 0.7 mLs (70 mg total) into the skin every 12 (twelve) hours. Qty: 22.4 Syringe, Refills: 0    hydrochlorothiazide (MICROZIDE) 12.5 MG capsule Take 1 capsule (12.5 mg total) by mouth daily. Qty: 30 capsule, Refills: 0    oxyCODONE (OXY IR/ROXICODONE) 5 MG immediate release tablet 1 by mouth every 4 hours as needed for breakthrough pain Qty: 15 tablet, Refills: 0      CONTINUE these medications which have CHANGED   Details  oxycodone (OXY-IR) 5 MG capsule Take 1 capsule (5 mg total) by mouth every 4 (four) hours as needed. Qty: 30 capsule, Refills: 0      CONTINUE these medications which have NOT CHANGED   Details  Amino Acids-Protein Hydrolys (FEEDING SUPPLEMENT, PRO-STAT SUGAR FREE 64,) LIQD Take 30 mLs by mouth 2 (two) times daily between meals.    citalopram (CELEXA) 20 MG tablet Take 20 mg by mouth daily.    fluticasone (FLONASE) 50 MCG/ACT nasal spray Place 2 sprays into both nostrils daily. Qty: 16 g, Refills: 4   Associated Diagnoses: Seasonal allergies    isosorbide mononitrate (IMDUR) 30 MG 24 hr tablet Take 30 mg by mouth daily.    pravastatin (PRAVACHOL) 80 MG tablet Take 1 tablet (80 mg total) by mouth every evening. Qty: 90 tablet, Refills: 3    tamsulosin (FLOMAX) 0.4 MG CAPS capsule Take 0.4 mg by mouth daily.    !! Vitamin D, Ergocalciferol, (DRISDOL) 50000 UNITS CAPS capsule Take 50,000 Units by mouth every 7 (seven) days. Takes on Tuesday    warfarin (COUMADIN) 2.5 MG tablet Take 2.5 mg by mouth daily.    !! ergocalciferol (VITAMIN D2) 50000 UNITS capsule Take 1 capsule (50,000 Units total) by mouth once a week. One capsule once weekly Qty: 4 capsule, Refills: 11   Associated Diagnoses: Unspecified vitamin D deficiency     !! - Potential duplicate medications found.  Please discuss with provider.    STOP taking these medications     OxyCODONE (OXYCONTIN) 10 mg T12A 12 hr tablet      vancomycin 1,000 mg in sodium chloride 0.9 % 250 mL      HYDROcodone-acetaminophen (NORCO/VICODIN) 5-325 MG per tablet        No Known Allergies    The results of significant diagnostics from this hospitalization (including imaging, microbiology, ancillary and laboratory) are listed below for reference.    Significant Diagnostic Studies: Dg Chest 1 View  11/27/2014   CLINICAL DATA:  Possible sepsis.  EXAM: CHEST  1 VIEW  COMPARISON:  11/2014.  FINDINGS: Low lung volumes. Cardiac enlargement. Aortic atherosclerosis. Slight vascular congestion. Mild bibasilar atelectasis. No frank infiltrates or overt failure. No visible pneumothorax. Similar appearance to priors.  IMPRESSION: Cardiomegaly.  No active disease.   Electronically Signed   By: Rolla Flatten M.D.   On: 11/27/2014 21:18   Dg Ribs Unilateral W/chest Right  11/06/2014  CLINICAL DATA:  [place] today, striking his head.  EXAM: RIGHT RIBS AND CHEST - 3+ VIEW  COMPARISON:  10/06/2014  FINDINGS: No fracture or other bone lesions are seen involving the ribs. There is no evidence of pneumothorax or pleural effusion. Heart size and mediastinal contours are within normal limits. There is interstitial coarsening, greatest in the periphery of the left base, likely chronic. No acute confluent airspace opacities are evident.  IMPRESSION: Negative for acute displaced fracture, pneumothorax or hemothorax. There is interstitial coarsening, left greater than right.   Electronically Signed   By: Andreas Newport M.D.   On: 11/06/2014 19:12   Dg Thoracic Spine W/swimmers  11/25/2014   CLINICAL DATA:  [place] from bit tonight. Severe back pain. Recent C7 fracture.  EXAM: THORACIC SPINE - 2 VIEW + SWIMMERS  COMPARISON:  Recent cervical CT studies.  FINDINGS: Alignment is normal. There is ordinary thoracic spondylosis. There is an old  compression fracture at T11 No acute fracture seen in the thoracic spine. C7 fracture shown at previous CT scan not clearly shown because of shoulder density.  IMPRESSION: Chronic degenerative changes. Old compression fracture T11. No evidence of acute thoracic region fracture.   Electronically Signed   By: Nelson Chimes M.D.   On: 11/25/2014 01:57   Dg Lumbar Spine Complete  11/25/2014   CLINICAL DATA:  Fall from bed with severe lower back pain.  EXAM: LUMBAR SPINE - COMPLETE 4+ VIEW  COMPARISON:  11/06/2014  FINDINGS: There is no evidence of fracture or traumatic malalignment. Anterior height loss at T11 and T12 is stable from previous radiography and abdominal CT 10/05/2014.  Bulky osteophytes without proportional disc narrowing, consistent with diffuse idiopathic skeletal hyperostosis. There is osteopenia.  IMPRESSION: 1. No acute osseous findings. 2. Diffuse idiopathic skeletal hyperostosis.   Electronically Signed   By: Monte Fantasia M.D.   On: 11/25/2014 01:58   Dg Lumbar Spine Complete  11/06/2014   CLINICAL DATA:  Fall while being moved, striking head.  EXAM: LUMBAR SPINE - COMPLETE 4+ VIEW  COMPARISON:  10/05/2014 CT scan  FINDINGS: Atherosclerosis. Prominent stool throughout the colon favors constipation. Bony demineralization. Bridging anterior spurring suggesting diffuse idiopathic skeletal hyperostosis in the lumbar spine. Minimal anterior wedging at T12 and L1, not appreciably changed from 11/30/2013.  Infrarenal abdominal aortic aneurysm is faintly apparent.  IMPRESSION: 1. No acute fracture or acute subluxation is identified. Flowing spurring suggesting diffuse idiopathic skeletal hyperostosis. 2. Bony demineralization. 3.  Prominent stool throughout the colon favors constipation. 4. Atherosclerosis with infrarenal abdominal aortic aneurysm.   Electronically Signed   By: Van Clines M.D.   On: 11/06/2014 19:13   Ct Head Wo Contrast  11/25/2014   CLINICAL DATA:  Patient found lying on  the floor. Personal history of C7 fracture.  EXAM: CT HEAD WITHOUT CONTRAST  CT CERVICAL SPINE WITHOUT CONTRAST  TECHNIQUE: Multidetector CT imaging of the head and cervical spine was performed following the standard protocol without intravenous contrast. Multiplanar CT image reconstructions of the cervical spine were also generated.  COMPARISON:  11/06/2014 and multiple previous  FINDINGS: CT HEAD FINDINGS  There is chronic bifrontal atrophy E related to previous aneurysm clipping. The brain in general shows atrophy with chronic small vessel change throughout the white matter. No sign of acute infarction, mass lesion, hemorrhage, hydrocephalus or extra-axial collection. No skull fracture. Mucosal inflammatory changes affect the paranasal sinuses. There is atherosclerotic calcification of the major vessels at the base of the brain.  CT CERVICAL SPINE  FINDINGS  No change since the previous study. There is a subacute fracture at the C7 level with loss of height anteriorly of 35%. No retropulsed bone. Fracture lines are slightly less distinct. No new fracture. Mild degenerative spondylosis and facet arthropathy, unchanged since before. Mild curvature convex to the right.  IMPRESSION: Head CT: No acute or traumatic finding. Bilateral frontal atrophy related to previous aneurysm clipping. Inflammatory changes of the sinuses.  Cervical spine CT: No acute injury. Subacute compression fracture at C7 with loss of height anteriorly of 35%. Fracture lines are slightly less distinct, indicating there is a degree of healing.   Electronically Signed   By: Nelson Chimes M.D.   On: 11/25/2014 01:46   Ct Head Wo Contrast  11/06/2014   CLINICAL DATA:  Fall, striking head. Focal swelling along the posterior head. Neck pain. History of stroke and diabetes.  EXAM: CT HEAD WITHOUT CONTRAST  CT CERVICAL SPINE WITHOUT CONTRAST  TECHNIQUE: Multidetector CT imaging of the head and cervical spine was performed following the standard protocol  without intravenous contrast. Multiplanar CT image reconstructions of the cervical spine were also generated.  COMPARISON:  10/05/2014  FINDINGS: CT HEAD FINDINGS  Metal artifacts along the midline of the anterior inferior falx likely reflecting clips. Bilateral anterior cerebral artery infarcts, chronic.  Periventricular white matter and corona radiata hypodensities favor chronic ischemic microvascular white matter disease. Remote lacunar infarcts in both lentiform nuclei and in the head of the left caudate. Lacunar infarct in the left periventricular white matter. These appear chronic.  Periventricular white matter and corona radiata hypodensities favor chronic ischemic microvascular white matter disease. Prior frontal craniotomy.  There is complete opacification of the visualized portion of the left maxillary sinus with subtotal opacification of the right maxillary sinus, opacification of multiple ethmoid air cells, and chronic sphenoid and frontal sinusitis.  No intracranial hemorrhage, mass lesion, or acute CVA. Mild scalp soft tissue swelling noted along the posterior vertex. The  CT CERVICAL SPINE FINDINGS  Advanced cervical spondylosis causing osseous foraminal stenosis especially bilaterally at C3-4 and C4-5.  Acute anterior compression fracture of C7 noted with 35% loss of height of the anterior vertebral body. Mild cortical discontinuity extends along the anterior portions of the superior and inferior endplates. No malalignment. New this fractures probably acute. No significant posterior bony retropulsion.  Dilated upper thoracic esophagus.  IMPRESSION: IMPRESSION 1. Acute 35% anterior wedge compression of the C7 vertebral body, without posterior bony retropulsion or subluxation. 2. Chronic anterior cerebral artery infarcts with chronic scattered lacunar infarcts and chronic microvascular white matter disease, but no acute findings intracranially. 3. Chronic paranasal pansinusitis. I cannot exclude acute  sinusitis of the left maxillary sinus ; the visualized portion of the left maxillary sinus is completely opacified but the entire sinus is not included. Prior frontal craniotomy. 4. Scalp soft tissue swelling along the posterior vertex of the head. 5. Dilated upper thoracic esophagus.  Critical Value/emergent results were called by telephone at the time of interpretation on 11/06/2014 at 7:51 pm to Dr. Francine Graven , who verbally acknowledged these results.   Electronically Signed   By: Van Clines M.D.   On: 11/06/2014 19:51   Ct Cervical Spine Wo Contrast  11/25/2014   CLINICAL DATA:  Patient found lying on the floor. Personal history of C7 fracture.  EXAM: CT HEAD WITHOUT CONTRAST  CT CERVICAL SPINE WITHOUT CONTRAST  TECHNIQUE: Multidetector CT imaging of the head and cervical spine was performed following the standard protocol without intravenous  contrast. Multiplanar CT image reconstructions of the cervical spine were also generated.  COMPARISON:  11/06/2014 and multiple previous  FINDINGS: CT HEAD FINDINGS  There is chronic bifrontal atrophy E related to previous aneurysm clipping. The brain in general shows atrophy with chronic small vessel change throughout the white matter. No sign of acute infarction, mass lesion, hemorrhage, hydrocephalus or extra-axial collection. No skull fracture. Mucosal inflammatory changes affect the paranasal sinuses. There is atherosclerotic calcification of the major vessels at the base of the brain.  CT CERVICAL SPINE FINDINGS  No change since the previous study. There is a subacute fracture at the C7 level with loss of height anteriorly of 35%. No retropulsed bone. Fracture lines are slightly less distinct. No new fracture. Mild degenerative spondylosis and facet arthropathy, unchanged since before. Mild curvature convex to the right.  IMPRESSION: Head CT: No acute or traumatic finding. Bilateral frontal atrophy related to previous aneurysm clipping. Inflammatory  changes of the sinuses.  Cervical spine CT: No acute injury. Subacute compression fracture at C7 with loss of height anteriorly of 35%. Fracture lines are slightly less distinct, indicating there is a degree of healing.   Electronically Signed   By: Nelson Chimes M.D.   On: 11/25/2014 01:46   Ct Cervical Spine Wo Contrast  11/06/2014   CLINICAL DATA:  Fall, striking head. Focal swelling along the posterior head. Neck pain. History of stroke and diabetes.  EXAM: CT HEAD WITHOUT CONTRAST  CT CERVICAL SPINE WITHOUT CONTRAST  TECHNIQUE: Multidetector CT imaging of the head and cervical spine was performed following the standard protocol without intravenous contrast. Multiplanar CT image reconstructions of the cervical spine were also generated.  COMPARISON:  10/05/2014  FINDINGS: CT HEAD FINDINGS  Metal artifacts along the midline of the anterior inferior falx likely reflecting clips. Bilateral anterior cerebral artery infarcts, chronic.  Periventricular white matter and corona radiata hypodensities favor chronic ischemic microvascular white matter disease. Remote lacunar infarcts in both lentiform nuclei and in the head of the left caudate. Lacunar infarct in the left periventricular white matter. These appear chronic.  Periventricular white matter and corona radiata hypodensities favor chronic ischemic microvascular white matter disease. Prior frontal craniotomy.  There is complete opacification of the visualized portion of the left maxillary sinus with subtotal opacification of the right maxillary sinus, opacification of multiple ethmoid air cells, and chronic sphenoid and frontal sinusitis.  No intracranial hemorrhage, mass lesion, or acute CVA. Mild scalp soft tissue swelling noted along the posterior vertex. The  CT CERVICAL SPINE FINDINGS  Advanced cervical spondylosis causing osseous foraminal stenosis especially bilaterally at C3-4 and C4-5.  Acute anterior compression fracture of C7 noted with 35% loss of  height of the anterior vertebral body. Mild cortical discontinuity extends along the anterior portions of the superior and inferior endplates. No malalignment. New this fractures probably acute. No significant posterior bony retropulsion.  Dilated upper thoracic esophagus.  IMPRESSION: IMPRESSION 1. Acute 35% anterior wedge compression of the C7 vertebral body, without posterior bony retropulsion or subluxation. 2. Chronic anterior cerebral artery infarcts with chronic scattered lacunar infarcts and chronic microvascular white matter disease, but no acute findings intracranially. 3. Chronic paranasal pansinusitis. I cannot exclude acute sinusitis of the left maxillary sinus ; the visualized portion of the left maxillary sinus is completely opacified but the entire sinus is not included. Prior frontal craniotomy. 4. Scalp soft tissue swelling along the posterior vertex of the head. 5. Dilated upper thoracic esophagus.  Critical Value/emergent results were called by telephone  at the time of interpretation on 11/06/2014 at 7:51 pm to Dr. Francine Graven , who verbally acknowledged these results.   Electronically Signed   By: Van Clines M.D.   On: 11/06/2014 19:51   Ct Lumbar Spine Wo Contrast  11/27/2014   CLINICAL DATA:  Low back pain.  EXAM: CT LUMBAR SPINE WITHOUT CONTRAST  TECHNIQUE: Multidetector CT imaging of the lumbar spine was performed without intravenous contrast administration. Multiplanar CT image reconstructions were also generated.  COMPARISON:  Lumbar radiographs 11/25/2014.  FINDINGS: Severe osseous demineralization. Anatomic alignment. Grossly preserved intervertebral disc spaces. Prominent anterior osteophytosis consistent with DISH. No definite acute compression deformity or worrisome osseous lesion. Marked posterior spinous process spurring. No endplate destruction to suggest osteomyelitis. No paravertebral inflammatory process. No psoas abscess.  Abdominal aortic aneurysm formation.  Transverse aorta measurement opposite L3 is 47 mm. Large RIGHT iliac artery aneurysm. Measurement opposite L5-S1 cross-section 51 x 46 mm.  Sacrum and pelvis findings described separately.  IMPRESSION: No acute lumbar spine abnormality.  Severe osseous demineralization.  Abdominal aortic aneurysm formation and RIGHT iliac for aneurysm formation.  Recommend followup by abdomen and pelvis CTA in 6 months, and vascular surgery referral/consultation if not already obtained. This recommendation follows ACR consensus guidelines: White Paper of the ACR Incidental Findings Committee II on Vascular Findings. J Am Coll Radiol 2013; 10:789-794.   Electronically Signed   By: Rolla Flatten M.D.   On: 11/27/2014 21:15   Ct Pelvis Wo Contrast  11/27/2014   CLINICAL DATA:  Sacral wound infection. Possible sepsis. Lower back pain, acute onset. Initial encounter.  EXAM: CT PELVIS WITHOUT CONTRAST  TECHNIQUE: Multidetector CT imaging of the pelvis was performed following the standard protocol without intravenous contrast.  COMPARISON:  CT of the abdomen and pelvis from 10/05/2014  FINDINGS: There is a prominent soft tissue wound at the medial aspect of the right buttock, with significant soft tissue air tracking about the right gluteus musculature and adjacent soft tissues, extending inferiorly along the posterior proximal right thigh. Associated diffuse soft tissue inflammation is seen, without a defined abscess. Necrotizing fasciitis cannot be excluded, given the appearance of the air.  Additional soft tissue inflammation and mild soft tissue air extends posterior to the sacrum and coccyx. Mild soft tissue inflammation extends superiorly along the right flank and mid back.  No focal osseous erosion is seen to suggest osteomyelitis at this time. The hip joints are unremarkable in appearance. The patient's right femoral intramedullary rod is grossly unremarkable; the patient is status post right above the knee amputation. The  left-sided dynamic hip screw is grossly unremarkable in appearance. The pubic symphysis is unremarkable. The sacroiliac joints are within normal limits. Chronic osseous defect is noted at the right iliac wing.  The patient's abdominal aortic aneurysm is again noted, measuring 4.5 cm in transverse dimension and 4.0 cm in AP dimension at the level of the aortic bifurcation, with marked dilatation of the right common iliac artery, measuring up to 4.7 cm in diameter. Visualized small and large bowel loops are grossly unremarkable. The rectum is partially filled with stool. The bladder is largely decompressed. The scrotum is grossly unremarkable.  IMPRESSION: 1. Prominent soft tissue wound at the medial aspect of the right buttock, with significant soft tissue air tracking about the right gluteus musculature and adjacent soft tissues, extending inferiorly along the posterior proximal right thigh. Associated diffuse soft tissue inflammation, without a defined abscess. Given the appearance of the air, necrotizing fasciitis is a concern. 2.  Additional soft tissue inflammation and mild soft tissue air extends posterior to the sacrum and coccyx. Mild soft tissue inflammation extends superiorly along the right flank and mid back. 3. No focal osseous erosions seen to suggest osteomyelitis at this time, though evaluation for osteomyelitis is mildly limited on CT. 4. Visualized bilateral femoral hardware is grossly unremarkable in appearance. 5. Abdominal aortic aneurysm again noted, measuring 4.5 cm in transverse dimension and 4.0 cm in AP dimension at the level of the aortic bifurcation, with marked dilatation of the right common iliac artery to 4.7 cm in diameter. Recommend followup by abdomen and pelvis CTA in 6 months, and vascular surgery referral/consultation if not already obtained. This recommendation follows ACR consensus guidelines: White Paper of the ACR Incidental Findings Committee II on Vascular Findings. J Am Coll  Radiol 2013; 10:789-794.  These results were called by telephone at the time of interpretation on 11/27/2014 at 9:19 pm to Dr. Tanna Furry, who verbally acknowledged these results.   Electronically Signed   By: Garald Balding M.D.   On: 11/27/2014 21:19   Dg Hip Unilat With Pelvis 2-3 Views Right  11/27/2014   CLINICAL DATA:  Right leg stump evaluation  EXAM: RIGHT HIP (WITH PELVIS) 2-3 VIEWS  COMPARISON:  None.  FINDINGS: There is no AP pelvis in this study limiting the exam. The right lower extremity has been amputated in the proximal femur diaphysis. There is a metal intra medullary rod that has also been partially cut. The metallic rod ends at the distal limit of the existing femur. No acute fracture. No dislocation. No destructive bone lesion. Heterotopic calcification is present in the distal stump soft tissues.  There is gas within the soft tissues over the right thigh. This is of unknown significance.  IMPRESSION: Status post amputation above the knee. No obvious destructive bone lesion.  There is gas in the soft tissues in the medial right thigh. Differential diagnosis includes herniated bowel or infection by gas-forming organism.   Electronically Signed   By: Marybelle Killings M.D.   On: 11/27/2014 16:19    Microbiology: Recent Results (from the past 240 hour(s))  Wound culture     Status: None   Collection Time: 11/24/14 10:00 AM  Result Value Ref Range Status   Specimen Description WOUND SACRAL  Final   Special Requests NONE  Final   Gram Stain   Final    ABUNDANT WBC PRESENT,BOTH PMN AND MONONUCLEAR NO SQUAMOUS EPITHELIAL CELLS SEEN ABUNDANT GRAM POSITIVE COCCI IN PAIRS ABUNDANT GRAM NEGATIVE RODS Performed at [facility]    Culture   Final    RARE METHICILLIN RESISTANT STAPHYLOCOCCUS AUREUS Note: RIFAMPIN AND GENTAMICIN SHOULD NOT BE USED AS SINGLE DRUGS FOR TREATMENT OF STAPH INFECTIONS. This organism is presumed to be Clindamycin resistant based on detection of inducible  Clindamycin resistance. CRITICAL RESULT CALLED TO, READ BACK BY AND  VERIFIED WITH: MICHELLE M. AT 10:30AM ON 11/27/14 HAJAM REPORT FAXED BY REQUEST Performed at [facility]    Report Status 11/27/2014 FINAL  Final   Organism ID, Bacteria METHICILLIN RESISTANT STAPHYLOCOCCUS AUREUS  Final      Susceptibility   Methicillin resistant staphylococcus aureus - MIC*    CLINDAMYCIN RESISTANT      ERYTHROMYCIN >=8 RESISTANT Resistant     GENTAMICIN <=0.5 SENSITIVE Sensitive     LEVOFLOXACIN >=8 RESISTANT Resistant     OXACILLIN >=4 RESISTANT Resistant     PENICILLIN >=0.5 RESISTANT Resistant     RIFAMPIN <=0.5 SENSITIVE  Sensitive     TRIMETH/SULFA <=10 SENSITIVE Sensitive     VANCOMYCIN 1 SENSITIVE Sensitive     TETRACYCLINE <=1 SENSITIVE Sensitive     * RARE METHICILLIN RESISTANT STAPHYLOCOCCUS AUREUS  Culture, blood (routine x 2)     Status: None   Collection Time: 11/27/14  8:19 PM  Result Value Ref Range Status   Specimen Description BLOOD LEFT ARM  Final   Special Requests BOTTLES DRAWN AEROBIC AND ANAEROBIC 6CC  Final   Culture NO GROWTH 5 DAYS  Final   Report Status 12/02/2014 FINAL  Final  Culture, blood (routine x 2)     Status: None   Collection Time: 11/27/14  8:23 PM  Result Value Ref Range Status   Specimen Description BLOOD LEFT HAND  Final   Special Requests BOTTLES DRAWN AEROBIC AND ANAEROBIC 6CC  Final   Culture NO GROWTH 5 DAYS  Final   Report Status 12/02/2014 FINAL  Final  MRSA PCR Screening     Status: Abnormal   Collection Time: 11/28/14  5:55 AM  Result Value Ref Range Status   MRSA by PCR POSITIVE (A) NEGATIVE Final    Comment:        The GeneXpert MRSA Assay (FDA approved for NASAL specimens only), is one component of a comprehensive MRSA colonization surveillance program. It is not intended to diagnose MRSA infection nor to guide or monitor treatment for MRSA infections. RESULT CALLED TO, READ BACK BY AND VERIFIED WITH: J.MAYS AT 0539 ON  11/28/14 BY S.VANHOORNE   Culture, Urine     Status: None   Collection Time: 11/28/14 10:45 AM  Result Value Ref Range Status   Specimen Description URINE, CATHETERIZED  Final   Special Requests VANCOMYCIN AND ZOSYN  Final   Colony Count NO GROWTH Performed at [facility]   Final   Culture NO GROWTH Performed at [facility]   Final   Report Status 11/30/2014 FINAL  Final  Culture, blood (routine x 2)     Status: None (Preliminary result)   Collection Time: 11/29/14  1:52 PM  Result Value Ref Range Status   Specimen Description BLOOD  Final   Special Requests NONE  Final   Culture NO GROWTH 3 DAYS  Final   Report Status PENDING  Incomplete  Culture, blood (routine x 2)     Status: None (Preliminary result)   Collection Time: 11/29/14  2:01 PM  Result Value Ref Range Status   Specimen Description BLOOD  Final   Special Requests NONE  Final   Culture NO GROWTH 3 DAYS  Final   Report Status PENDING  Incomplete  Respiratory virus panel     Status: None   Collection Time: 11/29/14  3:12 PM  Result Value Ref Range Status   Respiratory Syncytial Virus A Negative Negative Final   Respiratory Syncytial Virus B Negative Negative Final   Influenza A Negative Negative Final   Influenza B Negative Negative Final   Parainfluenza 1 Negative Negative Final   Parainfluenza 2 Negative Negative Final   Parainfluenza 3 Negative Negative Final   Metapneumovirus Negative Negative Final   Rhinovirus Negative Negative Final   Adenovirus Negative Negative Final    Comment: (NOTE) Performed At: [facility] [address] [place], [place] [id number] Lindon Romp MD TK:[id number]      Labs: Basic Metabolic Panel:  Recent Labs Lab 11/28/14 0516 11/29/14 0550 11/30/14 0546 12/01/14 0612 12/03/14 0609  NA 144 144 147* 142 141  K  3.9 3.8 3.5 3.4* 3.6  CL 113* 111 114* 111 112  CO2 _0 GLUCOSE 96 100* 85 102* 96  BUN _1 CREATININE  0.86 0.99 0.90 0.73 0.67  CALCIUM 8.5 8.3* 8.2* 8.3* 8.3*   Liver Function Tests:  Recent Labs Lab 11/27/14 2005  AST 30  ALT 22  ALKPHOS 69  BILITOT 0.4  PROT 7.3  ALBUMIN 2.2*   No results for input(s): LIPASE, AMYLASE in the last 168 hours. No results for input(s): AMMONIA in the last 168 hours. CBC:  Recent Labs Lab 11/27/14 2005 11/28/14 0516 11/29/14 0550 11/30/14 0546 12/02/14 0553  WBC 10.9* 12.5* 17.8* 13.2* 10.3  NEUTROABS 7.6  --   --   --   --   HGB 12.4* 11.6* 11.1* 10.5* 10.5*  HCT 38.7* 35.8* 36.4* 34.2* 33.4*  MCV 95.8 95.7 96.3 97.4 95.2  PLT 291 296 284 247 281   Cardiac Enzymes: No results for input(s): CKTOTAL, CKMB, CKMBINDEX, TROPONINI in the last 168 hours. BNP: BNP (last 3 results)  Recent Labs  10/05/14 1503  BNP 165.0*    ProBNP (last 3 results) No results for input(s): PROBNP in the last 8760 hours.  CBG:  Recent Labs Lab 12/02/14 1641 12/02/14 2035 12/03/14 0108 12/03/14 0409 12/03/14 0730  GLUCAP 97 121* 98 99 91       Signed:  FELIZ [name], [name] [name]  [facility] 12/03/2014, 9:45 AM

## 2014-12-04 LAB — CULTURE, BLOOD (ROUTINE X 2)
CULTURE: NO GROWTH
Culture: NO GROWTH

## 2014-12-19 DIAGNOSIS — R791 Abnormal coagulation profile: Secondary | ICD-10-CM | POA: Diagnosis not present

## 2014-12-27 DIAGNOSIS — Z86711 Personal history of pulmonary embolism: Secondary | ICD-10-CM | POA: Diagnosis not present

## 2014-12-27 DIAGNOSIS — L8994 Pressure ulcer of unspecified site, stage 4: Secondary | ICD-10-CM | POA: Diagnosis not present

## 2014-12-27 DIAGNOSIS — I714 Abdominal aortic aneurysm, without rupture: Secondary | ICD-10-CM | POA: Diagnosis not present

## 2014-12-27 DIAGNOSIS — G92 Toxic encephalopathy: Secondary | ICD-10-CM | POA: Diagnosis not present

## 2015-01-05 DIAGNOSIS — Z89611 Acquired absence of right leg above knee: Secondary | ICD-10-CM | POA: Diagnosis not present

## 2015-01-05 DIAGNOSIS — E43 Unspecified severe protein-calorie malnutrition: Secondary | ICD-10-CM | POA: Diagnosis not present

## 2015-01-05 DIAGNOSIS — N401 Enlarged prostate with lower urinary tract symptoms: Secondary | ICD-10-CM | POA: Diagnosis not present

## 2015-01-05 DIAGNOSIS — M159 Polyosteoarthritis, unspecified: Secondary | ICD-10-CM | POA: Diagnosis not present

## 2015-01-05 DIAGNOSIS — N183 Chronic kidney disease, stage 3 (moderate): Secondary | ICD-10-CM | POA: Diagnosis not present

## 2015-01-05 DIAGNOSIS — E559 Vitamin D deficiency, unspecified: Secondary | ICD-10-CM | POA: Diagnosis not present

## 2015-01-05 DIAGNOSIS — I10 Essential (primary) hypertension: Secondary | ICD-10-CM | POA: Diagnosis not present

## 2015-01-05 DIAGNOSIS — I2510 Atherosclerotic heart disease of native coronary artery without angina pectoris: Secondary | ICD-10-CM | POA: Diagnosis not present

## 2015-01-05 DIAGNOSIS — I6350 Cerebral infarction due to unspecified occlusion or stenosis of unspecified cerebral artery: Secondary | ICD-10-CM | POA: Diagnosis not present

## 2015-01-05 DIAGNOSIS — I739 Peripheral vascular disease, unspecified: Secondary | ICD-10-CM | POA: Diagnosis not present

## 2015-01-05 DIAGNOSIS — R279 Unspecified lack of coordination: Secondary | ICD-10-CM | POA: Diagnosis not present

## 2015-01-05 DIAGNOSIS — M6281 Muscle weakness (generalized): Secondary | ICD-10-CM | POA: Diagnosis not present

## 2015-01-05 DIAGNOSIS — I714 Abdominal aortic aneurysm, without rupture: Secondary | ICD-10-CM | POA: Diagnosis not present

## 2015-01-05 DIAGNOSIS — R6521 Severe sepsis with septic shock: Secondary | ICD-10-CM | POA: Diagnosis not present

## 2015-01-05 DIAGNOSIS — Z86711 Personal history of pulmonary embolism: Secondary | ICD-10-CM | POA: Diagnosis not present

## 2015-01-05 DIAGNOSIS — R278 Other lack of coordination: Secondary | ICD-10-CM | POA: Diagnosis not present

## 2015-01-05 DIAGNOSIS — A4902 Methicillin resistant Staphylococcus aureus infection, unspecified site: Secondary | ICD-10-CM | POA: Diagnosis not present

## 2015-01-05 DIAGNOSIS — D62 Acute posthemorrhagic anemia: Secondary | ICD-10-CM | POA: Diagnosis not present

## 2015-01-05 DIAGNOSIS — L8994 Pressure ulcer of unspecified site, stage 4: Secondary | ICD-10-CM | POA: Diagnosis not present

## 2015-01-05 DIAGNOSIS — R531 Weakness: Secondary | ICD-10-CM | POA: Diagnosis not present

## 2015-01-05 DIAGNOSIS — Z7901 Long term (current) use of anticoagulants: Secondary | ICD-10-CM | POA: Diagnosis not present

## 2015-01-05 DIAGNOSIS — D6959 Other secondary thrombocytopenia: Secondary | ICD-10-CM | POA: Diagnosis not present

## 2015-01-05 DIAGNOSIS — E785 Hyperlipidemia, unspecified: Secondary | ICD-10-CM | POA: Diagnosis not present

## 2015-01-05 DIAGNOSIS — Z8673 Personal history of transient ischemic attack (TIA), and cerebral infarction without residual deficits: Secondary | ICD-10-CM | POA: Diagnosis not present

## 2015-01-05 DIAGNOSIS — G92 Toxic encephalopathy: Secondary | ICD-10-CM | POA: Diagnosis not present

## 2015-01-05 DIAGNOSIS — G9340 Encephalopathy, unspecified: Secondary | ICD-10-CM | POA: Diagnosis not present

## 2015-01-05 DIAGNOSIS — I671 Cerebral aneurysm, nonruptured: Secondary | ICD-10-CM | POA: Diagnosis not present

## 2015-01-05 DIAGNOSIS — I2782 Chronic pulmonary embolism: Secondary | ICD-10-CM | POA: Diagnosis not present

## 2015-01-05 DIAGNOSIS — L89150 Pressure ulcer of sacral region, unstageable: Secondary | ICD-10-CM | POA: Diagnosis not present

## 2015-01-05 DIAGNOSIS — S12600A Unspecified displaced fracture of seventh cervical vertebra, initial encounter for closed fracture: Secondary | ICD-10-CM | POA: Diagnosis not present

## 2015-01-05 DIAGNOSIS — F320 Major depressive disorder, single episode, mild: Secondary | ICD-10-CM | POA: Diagnosis not present

## 2015-02-04 DIAGNOSIS — E43 Unspecified severe protein-calorie malnutrition: Secondary | ICD-10-CM | POA: Diagnosis not present

## 2015-02-04 DIAGNOSIS — G9340 Encephalopathy, unspecified: Secondary | ICD-10-CM | POA: Diagnosis not present

## 2015-02-04 DIAGNOSIS — I671 Cerebral aneurysm, nonruptured: Secondary | ICD-10-CM | POA: Diagnosis not present

## 2015-02-04 DIAGNOSIS — E785 Hyperlipidemia, unspecified: Secondary | ICD-10-CM | POA: Diagnosis not present

## 2015-02-04 DIAGNOSIS — M159 Polyosteoarthritis, unspecified: Secondary | ICD-10-CM | POA: Diagnosis not present

## 2015-02-04 DIAGNOSIS — I739 Peripheral vascular disease, unspecified: Secondary | ICD-10-CM | POA: Diagnosis not present

## 2015-02-04 DIAGNOSIS — R6521 Severe sepsis with septic shock: Secondary | ICD-10-CM | POA: Diagnosis not present

## 2015-02-04 DIAGNOSIS — Z8673 Personal history of transient ischemic attack (TIA), and cerebral infarction without residual deficits: Secondary | ICD-10-CM | POA: Diagnosis not present

## 2015-02-04 DIAGNOSIS — Z86711 Personal history of pulmonary embolism: Secondary | ICD-10-CM | POA: Diagnosis not present

## 2015-02-04 DIAGNOSIS — M6281 Muscle weakness (generalized): Secondary | ICD-10-CM | POA: Diagnosis not present

## 2015-02-04 DIAGNOSIS — Z89611 Acquired absence of right leg above knee: Secondary | ICD-10-CM | POA: Diagnosis not present

## 2015-02-04 DIAGNOSIS — Z7901 Long term (current) use of anticoagulants: Secondary | ICD-10-CM | POA: Diagnosis not present

## 2015-02-04 DIAGNOSIS — S12600A Unspecified displaced fracture of seventh cervical vertebra, initial encounter for closed fracture: Secondary | ICD-10-CM | POA: Diagnosis not present

## 2015-02-04 DIAGNOSIS — R531 Weakness: Secondary | ICD-10-CM | POA: Diagnosis not present

## 2015-02-04 DIAGNOSIS — G92 Toxic encephalopathy: Secondary | ICD-10-CM | POA: Diagnosis not present

## 2015-02-04 DIAGNOSIS — I6350 Cerebral infarction due to unspecified occlusion or stenosis of unspecified cerebral artery: Secondary | ICD-10-CM | POA: Diagnosis not present

## 2015-02-04 DIAGNOSIS — R278 Other lack of coordination: Secondary | ICD-10-CM | POA: Diagnosis not present

## 2015-02-04 DIAGNOSIS — A4902 Methicillin resistant Staphylococcus aureus infection, unspecified site: Secondary | ICD-10-CM | POA: Diagnosis not present

## 2015-02-04 DIAGNOSIS — I10 Essential (primary) hypertension: Secondary | ICD-10-CM | POA: Diagnosis not present

## 2015-02-04 DIAGNOSIS — I2782 Chronic pulmonary embolism: Secondary | ICD-10-CM | POA: Diagnosis not present

## 2015-02-04 DIAGNOSIS — F320 Major depressive disorder, single episode, mild: Secondary | ICD-10-CM | POA: Diagnosis not present

## 2015-02-04 DIAGNOSIS — I714 Abdominal aortic aneurysm, without rupture: Secondary | ICD-10-CM | POA: Diagnosis not present

## 2015-02-04 DIAGNOSIS — N183 Chronic kidney disease, stage 3 (moderate): Secondary | ICD-10-CM | POA: Diagnosis not present

## 2015-02-04 DIAGNOSIS — D62 Acute posthemorrhagic anemia: Secondary | ICD-10-CM | POA: Diagnosis not present

## 2015-02-04 DIAGNOSIS — L89150 Pressure ulcer of sacral region, unstageable: Secondary | ICD-10-CM | POA: Diagnosis not present

## 2015-02-04 DIAGNOSIS — I2510 Atherosclerotic heart disease of native coronary artery without angina pectoris: Secondary | ICD-10-CM | POA: Diagnosis not present

## 2015-02-04 DIAGNOSIS — L8994 Pressure ulcer of unspecified site, stage 4: Secondary | ICD-10-CM | POA: Diagnosis not present

## 2015-02-04 DIAGNOSIS — E559 Vitamin D deficiency, unspecified: Secondary | ICD-10-CM | POA: Diagnosis not present

## 2015-02-04 DIAGNOSIS — R279 Unspecified lack of coordination: Secondary | ICD-10-CM | POA: Diagnosis not present

## 2015-02-04 DIAGNOSIS — D6959 Other secondary thrombocytopenia: Secondary | ICD-10-CM | POA: Diagnosis not present

## 2015-02-04 DIAGNOSIS — N401 Enlarged prostate with lower urinary tract symptoms: Secondary | ICD-10-CM | POA: Diagnosis not present

## 2015-02-08 ENCOUNTER — Encounter: Payer: Self-pay | Admitting: Cardiovascular Disease

## 2015-02-14 DIAGNOSIS — L8994 Pressure ulcer of unspecified site, stage 4: Secondary | ICD-10-CM | POA: Diagnosis not present

## 2015-02-14 DIAGNOSIS — I10 Essential (primary) hypertension: Secondary | ICD-10-CM | POA: Diagnosis not present

## 2015-02-14 DIAGNOSIS — Z86711 Personal history of pulmonary embolism: Secondary | ICD-10-CM | POA: Diagnosis not present

## 2015-03-07 DIAGNOSIS — R279 Unspecified lack of coordination: Secondary | ICD-10-CM | POA: Diagnosis not present

## 2015-03-07 DIAGNOSIS — A4902 Methicillin resistant Staphylococcus aureus infection, unspecified site: Secondary | ICD-10-CM | POA: Diagnosis not present

## 2015-03-07 DIAGNOSIS — R531 Weakness: Secondary | ICD-10-CM | POA: Diagnosis not present

## 2015-03-07 DIAGNOSIS — R278 Other lack of coordination: Secondary | ICD-10-CM | POA: Diagnosis not present

## 2015-03-07 DIAGNOSIS — M6281 Muscle weakness (generalized): Secondary | ICD-10-CM | POA: Diagnosis not present

## 2015-03-08 DIAGNOSIS — R531 Weakness: Secondary | ICD-10-CM | POA: Diagnosis not present

## 2015-03-08 DIAGNOSIS — A4902 Methicillin resistant Staphylococcus aureus infection, unspecified site: Secondary | ICD-10-CM | POA: Diagnosis not present

## 2015-03-08 DIAGNOSIS — R279 Unspecified lack of coordination: Secondary | ICD-10-CM | POA: Diagnosis not present

## 2015-03-08 DIAGNOSIS — M6281 Muscle weakness (generalized): Secondary | ICD-10-CM | POA: Diagnosis not present

## 2015-03-08 DIAGNOSIS — R278 Other lack of coordination: Secondary | ICD-10-CM | POA: Diagnosis not present

## 2015-03-09 DIAGNOSIS — M6281 Muscle weakness (generalized): Secondary | ICD-10-CM | POA: Diagnosis not present

## 2015-03-09 DIAGNOSIS — R531 Weakness: Secondary | ICD-10-CM | POA: Diagnosis not present

## 2015-03-09 DIAGNOSIS — R278 Other lack of coordination: Secondary | ICD-10-CM | POA: Diagnosis not present

## 2015-03-09 DIAGNOSIS — R279 Unspecified lack of coordination: Secondary | ICD-10-CM | POA: Diagnosis not present

## 2015-03-09 DIAGNOSIS — A4902 Methicillin resistant Staphylococcus aureus infection, unspecified site: Secondary | ICD-10-CM | POA: Diagnosis not present

## 2015-03-10 DIAGNOSIS — R531 Weakness: Secondary | ICD-10-CM | POA: Diagnosis not present

## 2015-03-10 DIAGNOSIS — R278 Other lack of coordination: Secondary | ICD-10-CM | POA: Diagnosis not present

## 2015-03-10 DIAGNOSIS — A4902 Methicillin resistant Staphylococcus aureus infection, unspecified site: Secondary | ICD-10-CM | POA: Diagnosis not present

## 2015-03-10 DIAGNOSIS — R279 Unspecified lack of coordination: Secondary | ICD-10-CM | POA: Diagnosis not present

## 2015-03-10 DIAGNOSIS — M6281 Muscle weakness (generalized): Secondary | ICD-10-CM | POA: Diagnosis not present

## 2015-03-11 DIAGNOSIS — R279 Unspecified lack of coordination: Secondary | ICD-10-CM | POA: Diagnosis not present

## 2015-03-11 DIAGNOSIS — A4902 Methicillin resistant Staphylococcus aureus infection, unspecified site: Secondary | ICD-10-CM | POA: Diagnosis not present

## 2015-03-11 DIAGNOSIS — M6281 Muscle weakness (generalized): Secondary | ICD-10-CM | POA: Diagnosis not present

## 2015-03-11 DIAGNOSIS — R531 Weakness: Secondary | ICD-10-CM | POA: Diagnosis not present

## 2015-03-11 DIAGNOSIS — R278 Other lack of coordination: Secondary | ICD-10-CM | POA: Diagnosis not present

## 2015-03-14 DIAGNOSIS — M6281 Muscle weakness (generalized): Secondary | ICD-10-CM | POA: Diagnosis not present

## 2015-03-14 DIAGNOSIS — R531 Weakness: Secondary | ICD-10-CM | POA: Diagnosis not present

## 2015-03-14 DIAGNOSIS — R278 Other lack of coordination: Secondary | ICD-10-CM | POA: Diagnosis not present

## 2015-03-14 DIAGNOSIS — A4902 Methicillin resistant Staphylococcus aureus infection, unspecified site: Secondary | ICD-10-CM | POA: Diagnosis not present

## 2015-03-14 DIAGNOSIS — R279 Unspecified lack of coordination: Secondary | ICD-10-CM | POA: Diagnosis not present

## 2015-03-15 DIAGNOSIS — A4902 Methicillin resistant Staphylococcus aureus infection, unspecified site: Secondary | ICD-10-CM | POA: Diagnosis not present

## 2015-03-15 DIAGNOSIS — M6281 Muscle weakness (generalized): Secondary | ICD-10-CM | POA: Diagnosis not present

## 2015-03-15 DIAGNOSIS — R279 Unspecified lack of coordination: Secondary | ICD-10-CM | POA: Diagnosis not present

## 2015-03-15 DIAGNOSIS — R531 Weakness: Secondary | ICD-10-CM | POA: Diagnosis not present

## 2015-03-15 DIAGNOSIS — R278 Other lack of coordination: Secondary | ICD-10-CM | POA: Diagnosis not present

## 2015-03-16 DIAGNOSIS — R279 Unspecified lack of coordination: Secondary | ICD-10-CM | POA: Diagnosis not present

## 2015-03-16 DIAGNOSIS — R278 Other lack of coordination: Secondary | ICD-10-CM | POA: Diagnosis not present

## 2015-03-16 DIAGNOSIS — R531 Weakness: Secondary | ICD-10-CM | POA: Diagnosis not present

## 2015-03-16 DIAGNOSIS — A4902 Methicillin resistant Staphylococcus aureus infection, unspecified site: Secondary | ICD-10-CM | POA: Diagnosis not present

## 2015-03-16 DIAGNOSIS — M6281 Muscle weakness (generalized): Secondary | ICD-10-CM | POA: Diagnosis not present

## 2015-03-17 DIAGNOSIS — R531 Weakness: Secondary | ICD-10-CM | POA: Diagnosis not present

## 2015-03-17 DIAGNOSIS — R278 Other lack of coordination: Secondary | ICD-10-CM | POA: Diagnosis not present

## 2015-03-17 DIAGNOSIS — A4902 Methicillin resistant Staphylococcus aureus infection, unspecified site: Secondary | ICD-10-CM | POA: Diagnosis not present

## 2015-03-17 DIAGNOSIS — M6281 Muscle weakness (generalized): Secondary | ICD-10-CM | POA: Diagnosis not present

## 2015-03-17 DIAGNOSIS — R279 Unspecified lack of coordination: Secondary | ICD-10-CM | POA: Diagnosis not present

## 2015-03-18 DIAGNOSIS — R791 Abnormal coagulation profile: Secondary | ICD-10-CM | POA: Diagnosis not present

## 2015-03-18 DIAGNOSIS — R279 Unspecified lack of coordination: Secondary | ICD-10-CM | POA: Diagnosis not present

## 2015-03-18 DIAGNOSIS — R278 Other lack of coordination: Secondary | ICD-10-CM | POA: Diagnosis not present

## 2015-03-18 DIAGNOSIS — R531 Weakness: Secondary | ICD-10-CM | POA: Diagnosis not present

## 2015-03-18 DIAGNOSIS — A4902 Methicillin resistant Staphylococcus aureus infection, unspecified site: Secondary | ICD-10-CM | POA: Diagnosis not present

## 2015-03-18 DIAGNOSIS — M6281 Muscle weakness (generalized): Secondary | ICD-10-CM | POA: Diagnosis not present

## 2015-03-21 DIAGNOSIS — R531 Weakness: Secondary | ICD-10-CM | POA: Diagnosis not present

## 2015-03-21 DIAGNOSIS — M6281 Muscle weakness (generalized): Secondary | ICD-10-CM | POA: Diagnosis not present

## 2015-03-21 DIAGNOSIS — A4902 Methicillin resistant Staphylococcus aureus infection, unspecified site: Secondary | ICD-10-CM | POA: Diagnosis not present

## 2015-03-21 DIAGNOSIS — R279 Unspecified lack of coordination: Secondary | ICD-10-CM | POA: Diagnosis not present

## 2015-03-21 DIAGNOSIS — R278 Other lack of coordination: Secondary | ICD-10-CM | POA: Diagnosis not present

## 2015-03-22 DIAGNOSIS — R278 Other lack of coordination: Secondary | ICD-10-CM | POA: Diagnosis not present

## 2015-03-22 DIAGNOSIS — A4902 Methicillin resistant Staphylococcus aureus infection, unspecified site: Secondary | ICD-10-CM | POA: Diagnosis not present

## 2015-03-22 DIAGNOSIS — R531 Weakness: Secondary | ICD-10-CM | POA: Diagnosis not present

## 2015-03-22 DIAGNOSIS — M6281 Muscle weakness (generalized): Secondary | ICD-10-CM | POA: Diagnosis not present

## 2015-03-22 DIAGNOSIS — R279 Unspecified lack of coordination: Secondary | ICD-10-CM | POA: Diagnosis not present

## 2015-03-23 DIAGNOSIS — M6281 Muscle weakness (generalized): Secondary | ICD-10-CM | POA: Diagnosis not present

## 2015-03-23 DIAGNOSIS — R531 Weakness: Secondary | ICD-10-CM | POA: Diagnosis not present

## 2015-03-23 DIAGNOSIS — R279 Unspecified lack of coordination: Secondary | ICD-10-CM | POA: Diagnosis not present

## 2015-03-23 DIAGNOSIS — A4902 Methicillin resistant Staphylococcus aureus infection, unspecified site: Secondary | ICD-10-CM | POA: Diagnosis not present

## 2015-03-23 DIAGNOSIS — R278 Other lack of coordination: Secondary | ICD-10-CM | POA: Diagnosis not present

## 2015-03-24 DIAGNOSIS — M6281 Muscle weakness (generalized): Secondary | ICD-10-CM | POA: Diagnosis not present

## 2015-03-24 DIAGNOSIS — R531 Weakness: Secondary | ICD-10-CM | POA: Diagnosis not present

## 2015-03-24 DIAGNOSIS — A4902 Methicillin resistant Staphylococcus aureus infection, unspecified site: Secondary | ICD-10-CM | POA: Diagnosis not present

## 2015-03-24 DIAGNOSIS — R278 Other lack of coordination: Secondary | ICD-10-CM | POA: Diagnosis not present

## 2015-03-24 DIAGNOSIS — R279 Unspecified lack of coordination: Secondary | ICD-10-CM | POA: Diagnosis not present

## 2015-03-25 DIAGNOSIS — A4902 Methicillin resistant Staphylococcus aureus infection, unspecified site: Secondary | ICD-10-CM | POA: Diagnosis not present

## 2015-03-25 DIAGNOSIS — R531 Weakness: Secondary | ICD-10-CM | POA: Diagnosis not present

## 2015-03-25 DIAGNOSIS — R279 Unspecified lack of coordination: Secondary | ICD-10-CM | POA: Diagnosis not present

## 2015-03-25 DIAGNOSIS — M6281 Muscle weakness (generalized): Secondary | ICD-10-CM | POA: Diagnosis not present

## 2015-03-25 DIAGNOSIS — R278 Other lack of coordination: Secondary | ICD-10-CM | POA: Diagnosis not present

## 2015-03-28 DIAGNOSIS — R279 Unspecified lack of coordination: Secondary | ICD-10-CM | POA: Diagnosis not present

## 2015-03-28 DIAGNOSIS — M6281 Muscle weakness (generalized): Secondary | ICD-10-CM | POA: Diagnosis not present

## 2015-03-28 DIAGNOSIS — A4902 Methicillin resistant Staphylococcus aureus infection, unspecified site: Secondary | ICD-10-CM | POA: Diagnosis not present

## 2015-03-28 DIAGNOSIS — R531 Weakness: Secondary | ICD-10-CM | POA: Diagnosis not present

## 2015-03-28 DIAGNOSIS — R278 Other lack of coordination: Secondary | ICD-10-CM | POA: Diagnosis not present

## 2015-03-29 DIAGNOSIS — R279 Unspecified lack of coordination: Secondary | ICD-10-CM | POA: Diagnosis not present

## 2015-03-29 DIAGNOSIS — M6281 Muscle weakness (generalized): Secondary | ICD-10-CM | POA: Diagnosis not present

## 2015-03-29 DIAGNOSIS — R278 Other lack of coordination: Secondary | ICD-10-CM | POA: Diagnosis not present

## 2015-03-29 DIAGNOSIS — R531 Weakness: Secondary | ICD-10-CM | POA: Diagnosis not present

## 2015-03-29 DIAGNOSIS — A4902 Methicillin resistant Staphylococcus aureus infection, unspecified site: Secondary | ICD-10-CM | POA: Diagnosis not present

## 2015-03-30 DIAGNOSIS — R278 Other lack of coordination: Secondary | ICD-10-CM | POA: Diagnosis not present

## 2015-03-30 DIAGNOSIS — R531 Weakness: Secondary | ICD-10-CM | POA: Diagnosis not present

## 2015-03-30 DIAGNOSIS — R279 Unspecified lack of coordination: Secondary | ICD-10-CM | POA: Diagnosis not present

## 2015-03-30 DIAGNOSIS — A4902 Methicillin resistant Staphylococcus aureus infection, unspecified site: Secondary | ICD-10-CM | POA: Diagnosis not present

## 2015-03-30 DIAGNOSIS — M6281 Muscle weakness (generalized): Secondary | ICD-10-CM | POA: Diagnosis not present

## 2015-03-31 DIAGNOSIS — R278 Other lack of coordination: Secondary | ICD-10-CM | POA: Diagnosis not present

## 2015-03-31 DIAGNOSIS — M6281 Muscle weakness (generalized): Secondary | ICD-10-CM | POA: Diagnosis not present

## 2015-03-31 DIAGNOSIS — A4902 Methicillin resistant Staphylococcus aureus infection, unspecified site: Secondary | ICD-10-CM | POA: Diagnosis not present

## 2015-03-31 DIAGNOSIS — R531 Weakness: Secondary | ICD-10-CM | POA: Diagnosis not present

## 2015-03-31 DIAGNOSIS — R279 Unspecified lack of coordination: Secondary | ICD-10-CM | POA: Diagnosis not present

## 2015-04-01 DIAGNOSIS — M6281 Muscle weakness (generalized): Secondary | ICD-10-CM | POA: Diagnosis not present

## 2015-04-01 DIAGNOSIS — L8994 Pressure ulcer of unspecified site, stage 4: Secondary | ICD-10-CM | POA: Diagnosis not present

## 2015-04-01 DIAGNOSIS — R531 Weakness: Secondary | ICD-10-CM | POA: Diagnosis not present

## 2015-04-01 DIAGNOSIS — A4902 Methicillin resistant Staphylococcus aureus infection, unspecified site: Secondary | ICD-10-CM | POA: Diagnosis not present

## 2015-04-01 DIAGNOSIS — R278 Other lack of coordination: Secondary | ICD-10-CM | POA: Diagnosis not present

## 2015-04-01 DIAGNOSIS — R279 Unspecified lack of coordination: Secondary | ICD-10-CM | POA: Diagnosis not present

## 2015-04-01 DIAGNOSIS — I10 Essential (primary) hypertension: Secondary | ICD-10-CM | POA: Diagnosis not present

## 2015-04-01 DIAGNOSIS — Z86711 Personal history of pulmonary embolism: Secondary | ICD-10-CM | POA: Diagnosis not present

## 2015-04-04 DIAGNOSIS — M6281 Muscle weakness (generalized): Secondary | ICD-10-CM | POA: Diagnosis not present

## 2015-04-04 DIAGNOSIS — R279 Unspecified lack of coordination: Secondary | ICD-10-CM | POA: Diagnosis not present

## 2015-04-04 DIAGNOSIS — R278 Other lack of coordination: Secondary | ICD-10-CM | POA: Diagnosis not present

## 2015-04-04 DIAGNOSIS — A4902 Methicillin resistant Staphylococcus aureus infection, unspecified site: Secondary | ICD-10-CM | POA: Diagnosis not present

## 2015-04-04 DIAGNOSIS — R531 Weakness: Secondary | ICD-10-CM | POA: Diagnosis not present

## 2015-04-06 DIAGNOSIS — R791 Abnormal coagulation profile: Secondary | ICD-10-CM | POA: Diagnosis not present

## 2015-04-20 DIAGNOSIS — R791 Abnormal coagulation profile: Secondary | ICD-10-CM | POA: Diagnosis not present

## 2015-05-04 DIAGNOSIS — R791 Abnormal coagulation profile: Secondary | ICD-10-CM | POA: Diagnosis not present

## 2015-05-09 ENCOUNTER — Ambulatory Visit (HOSPITAL_COMMUNITY)
Admission: RE | Admit: 2015-05-09 | Discharge: 2015-05-09 | Disposition: A | Discharge status: Home / Self Care | Payer: Medicare Other | Source: Ambulatory Visit | Attending: Vascular Surgery | Admitting: Vascular Surgery

## 2015-05-09 ENCOUNTER — Ambulatory Visit (HOSPITAL_COMMUNITY)
Admission: RE | Admit: 2015-05-09 | Discharge: 2015-05-09 | Disposition: A | Discharge status: Home / Self Care | Payer: Medicare Other | Source: Ambulatory Visit | Attending: Surgery | Admitting: Surgery

## 2015-05-09 DIAGNOSIS — Z87891 Personal history of nicotine dependence: Secondary | ICD-10-CM | POA: Insufficient documentation

## 2015-05-09 DIAGNOSIS — I739 Peripheral vascular disease, unspecified: Secondary | ICD-10-CM

## 2015-05-09 DIAGNOSIS — E785 Hyperlipidemia, unspecified: Secondary | ICD-10-CM | POA: Diagnosis not present

## 2015-05-09 DIAGNOSIS — I10 Essential (primary) hypertension: Secondary | ICD-10-CM | POA: Diagnosis not present

## 2015-05-09 DIAGNOSIS — I714 Abdominal aortic aneurysm, without rupture, unspecified: Secondary | ICD-10-CM

## 2015-05-09 DIAGNOSIS — E119 Type 2 diabetes mellitus without complications: Secondary | ICD-10-CM | POA: Insufficient documentation

## 2015-05-10 DIAGNOSIS — E1051 Type 1 diabetes mellitus with diabetic peripheral angiopathy without gangrene: Secondary | ICD-10-CM | POA: Diagnosis not present

## 2015-05-16 ENCOUNTER — Ambulatory Visit: Payer: Medicare Other | Admitting: Surgery

## 2015-05-17 DIAGNOSIS — L8994 Pressure ulcer of unspecified site, stage 4: Secondary | ICD-10-CM | POA: Diagnosis not present

## 2015-05-17 DIAGNOSIS — I10 Essential (primary) hypertension: Secondary | ICD-10-CM | POA: Diagnosis not present

## 2015-05-17 DIAGNOSIS — L89894 Pressure ulcer of other site, stage 4: Secondary | ICD-10-CM | POA: Diagnosis not present

## 2015-05-17 DIAGNOSIS — Z86711 Personal history of pulmonary embolism: Secondary | ICD-10-CM | POA: Diagnosis not present

## 2015-05-20 DIAGNOSIS — Z23 Encounter for immunization: Secondary | ICD-10-CM | POA: Diagnosis not present

## 2015-05-23 DIAGNOSIS — R791 Abnormal coagulation profile: Secondary | ICD-10-CM | POA: Diagnosis not present

## 2015-05-25 ENCOUNTER — Encounter: Payer: Self-pay | Admitting: Surgery

## 2015-05-26 DIAGNOSIS — R740 Nonspecific elevation of levels of transaminase and lactic acid dehydrogenase [LDH]: Secondary | ICD-10-CM | POA: Diagnosis not present

## 2015-05-26 DIAGNOSIS — E785 Hyperlipidemia, unspecified: Secondary | ICD-10-CM | POA: Diagnosis not present

## 2015-05-26 DIAGNOSIS — R791 Abnormal coagulation profile: Secondary | ICD-10-CM | POA: Diagnosis not present

## 2015-05-30 ENCOUNTER — Encounter: Payer: Self-pay | Admitting: Surgery

## 2015-05-30 ENCOUNTER — Ambulatory Visit (INDEPENDENT_AMBULATORY_CARE_PROVIDER_SITE_OTHER): Payer: Medicare Other | Admitting: Surgery

## 2015-05-30 VITALS — BP 137/83 | HR 71 | Temp 98.0°F | Resp 16 | Ht 69.0 in | Wt 156.0 lb

## 2015-05-30 DIAGNOSIS — I714 Abdominal aortic aneurysm, without rupture, unspecified: Secondary | ICD-10-CM

## 2015-05-30 NOTE — Addendum Note (Signed)
Addended by: Adria DillELDRIDGE-LEWIS, Nkechi Linehan L on: 05/30/2015 12:40 PM   Modules accepted: Orders
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
## 2015-06-03 ENCOUNTER — Encounter: Payer: Self-pay | Admitting: Internal Medicine

## 2015-06-08 DIAGNOSIS — E119 Type 2 diabetes mellitus without complications: Secondary | ICD-10-CM | POA: Diagnosis not present

## 2015-06-08 DIAGNOSIS — Z8249 Family history of ischemic heart disease and other diseases of the circulatory system: Secondary | ICD-10-CM | POA: Diagnosis not present

## 2015-06-08 DIAGNOSIS — Z87891 Personal history of nicotine dependence: Secondary | ICD-10-CM | POA: Diagnosis not present

## 2015-06-08 DIAGNOSIS — Z79899 Other long term (current) drug therapy: Secondary | ICD-10-CM | POA: Diagnosis not present

## 2015-06-08 DIAGNOSIS — F418 Other specified anxiety disorders: Secondary | ICD-10-CM | POA: Diagnosis not present

## 2015-06-08 DIAGNOSIS — E785 Hyperlipidemia, unspecified: Secondary | ICD-10-CM | POA: Diagnosis not present

## 2015-06-08 DIAGNOSIS — E1122 Type 2 diabetes mellitus with diabetic chronic kidney disease: Secondary | ICD-10-CM | POA: Diagnosis not present

## 2015-06-08 DIAGNOSIS — Z86711 Personal history of pulmonary embolism: Secondary | ICD-10-CM | POA: Diagnosis not present

## 2015-06-08 DIAGNOSIS — I129 Hypertensive chronic kidney disease with stage 1 through stage 4 chronic kidney disease, or unspecified chronic kidney disease: Secondary | ICD-10-CM | POA: Diagnosis not present

## 2015-06-08 DIAGNOSIS — Z7901 Long term (current) use of anticoagulants: Secondary | ICD-10-CM | POA: Diagnosis not present

## 2015-06-08 DIAGNOSIS — I714 Abdominal aortic aneurysm, without rupture: Secondary | ICD-10-CM | POA: Diagnosis not present

## 2015-06-08 DIAGNOSIS — Z89611 Acquired absence of right leg above knee: Secondary | ICD-10-CM | POA: Diagnosis not present

## 2015-06-08 DIAGNOSIS — Z823 Family history of stroke: Secondary | ICD-10-CM | POA: Diagnosis not present

## 2015-06-08 DIAGNOSIS — I739 Peripheral vascular disease, unspecified: Secondary | ICD-10-CM | POA: Diagnosis not present

## 2015-06-08 DIAGNOSIS — N400 Enlarged prostate without lower urinary tract symptoms: Secondary | ICD-10-CM | POA: Diagnosis not present

## 2015-06-08 DIAGNOSIS — N183 Chronic kidney disease, stage 3 (moderate): Secondary | ICD-10-CM | POA: Diagnosis not present

## 2015-06-08 DIAGNOSIS — Z833 Family history of diabetes mellitus: Secondary | ICD-10-CM | POA: Diagnosis not present

## 2015-06-08 DIAGNOSIS — I2510 Atherosclerotic heart disease of native coronary artery without angina pectoris: Secondary | ICD-10-CM | POA: Diagnosis not present

## 2015-06-08 DIAGNOSIS — Z8673 Personal history of transient ischemic attack (TIA), and cerebral infarction without residual deficits: Secondary | ICD-10-CM | POA: Diagnosis not present

## 2015-06-08 DIAGNOSIS — Z993 Dependence on wheelchair: Secondary | ICD-10-CM | POA: Diagnosis not present

## 2015-06-08 DIAGNOSIS — L89154 Pressure ulcer of sacral region, stage 4: Secondary | ICD-10-CM | POA: Diagnosis not present

## 2015-06-10 DIAGNOSIS — R791 Abnormal coagulation profile: Secondary | ICD-10-CM | POA: Diagnosis not present

## 2015-06-13 DIAGNOSIS — A4902 Methicillin resistant Staphylococcus aureus infection, unspecified site: Secondary | ICD-10-CM | POA: Diagnosis not present

## 2015-06-13 DIAGNOSIS — R278 Other lack of coordination: Secondary | ICD-10-CM | POA: Diagnosis not present

## 2015-06-13 DIAGNOSIS — M6281 Muscle weakness (generalized): Secondary | ICD-10-CM | POA: Diagnosis not present

## 2015-06-13 DIAGNOSIS — R279 Unspecified lack of coordination: Secondary | ICD-10-CM | POA: Diagnosis not present

## 2015-06-13 DIAGNOSIS — R531 Weakness: Secondary | ICD-10-CM | POA: Diagnosis not present

## 2015-06-15 DIAGNOSIS — R531 Weakness: Secondary | ICD-10-CM | POA: Diagnosis not present

## 2015-06-15 DIAGNOSIS — R278 Other lack of coordination: Secondary | ICD-10-CM | POA: Diagnosis not present

## 2015-06-15 DIAGNOSIS — A4902 Methicillin resistant Staphylococcus aureus infection, unspecified site: Secondary | ICD-10-CM | POA: Diagnosis not present

## 2015-06-15 DIAGNOSIS — R279 Unspecified lack of coordination: Secondary | ICD-10-CM | POA: Diagnosis not present

## 2015-06-15 DIAGNOSIS — M6281 Muscle weakness (generalized): Secondary | ICD-10-CM | POA: Diagnosis not present

## 2015-06-18 DIAGNOSIS — R279 Unspecified lack of coordination: Secondary | ICD-10-CM | POA: Diagnosis not present

## 2015-06-18 DIAGNOSIS — M6281 Muscle weakness (generalized): Secondary | ICD-10-CM | POA: Diagnosis not present

## 2015-06-18 DIAGNOSIS — A4902 Methicillin resistant Staphylococcus aureus infection, unspecified site: Secondary | ICD-10-CM | POA: Diagnosis not present

## 2015-06-18 DIAGNOSIS — R278 Other lack of coordination: Secondary | ICD-10-CM | POA: Diagnosis not present

## 2015-06-18 DIAGNOSIS — R531 Weakness: Secondary | ICD-10-CM | POA: Diagnosis not present

## 2015-06-21 DIAGNOSIS — R278 Other lack of coordination: Secondary | ICD-10-CM | POA: Diagnosis not present

## 2015-06-21 DIAGNOSIS — A4902 Methicillin resistant Staphylococcus aureus infection, unspecified site: Secondary | ICD-10-CM | POA: Diagnosis not present

## 2015-06-21 DIAGNOSIS — M6281 Muscle weakness (generalized): Secondary | ICD-10-CM | POA: Diagnosis not present

## 2015-06-21 DIAGNOSIS — R279 Unspecified lack of coordination: Secondary | ICD-10-CM | POA: Diagnosis not present

## 2015-06-21 DIAGNOSIS — R531 Weakness: Secondary | ICD-10-CM | POA: Diagnosis not present

## 2015-06-22 DIAGNOSIS — M6281 Muscle weakness (generalized): Secondary | ICD-10-CM | POA: Diagnosis not present

## 2015-06-22 DIAGNOSIS — R278 Other lack of coordination: Secondary | ICD-10-CM | POA: Diagnosis not present

## 2015-06-22 DIAGNOSIS — R279 Unspecified lack of coordination: Secondary | ICD-10-CM | POA: Diagnosis not present

## 2015-06-22 DIAGNOSIS — A4902 Methicillin resistant Staphylococcus aureus infection, unspecified site: Secondary | ICD-10-CM | POA: Diagnosis not present

## 2015-06-22 DIAGNOSIS — T8189XA Other complications of procedures, not elsewhere classified, initial encounter: Secondary | ICD-10-CM | POA: Diagnosis not present

## 2015-06-22 DIAGNOSIS — R531 Weakness: Secondary | ICD-10-CM | POA: Diagnosis not present

## 2015-06-24 DIAGNOSIS — R944 Abnormal results of kidney function studies: Secondary | ICD-10-CM | POA: Diagnosis not present

## 2015-06-24 DIAGNOSIS — M6281 Muscle weakness (generalized): Secondary | ICD-10-CM | POA: Diagnosis not present

## 2015-06-24 DIAGNOSIS — R531 Weakness: Secondary | ICD-10-CM | POA: Diagnosis not present

## 2015-06-24 DIAGNOSIS — A4902 Methicillin resistant Staphylococcus aureus infection, unspecified site: Secondary | ICD-10-CM | POA: Diagnosis not present

## 2015-06-24 DIAGNOSIS — R279 Unspecified lack of coordination: Secondary | ICD-10-CM | POA: Diagnosis not present

## 2015-06-24 DIAGNOSIS — R278 Other lack of coordination: Secondary | ICD-10-CM | POA: Diagnosis not present

## 2015-06-24 DIAGNOSIS — R791 Abnormal coagulation profile: Secondary | ICD-10-CM | POA: Diagnosis not present

## 2015-06-24 DIAGNOSIS — I10 Essential (primary) hypertension: Secondary | ICD-10-CM | POA: Diagnosis not present

## 2015-06-27 DIAGNOSIS — R791 Abnormal coagulation profile: Secondary | ICD-10-CM | POA: Diagnosis not present

## 2015-06-28 ENCOUNTER — Encounter: Payer: Self-pay | Admitting: Surgery

## 2015-06-29 ENCOUNTER — Ambulatory Visit
Admission: RE | Admit: 2015-06-29 | Discharge: 2015-06-29 | Disposition: A | Discharge status: Home / Self Care | Payer: Medicare Other | Source: Ambulatory Visit | Attending: Surgery | Admitting: Surgery

## 2015-06-29 DIAGNOSIS — I714 Abdominal aortic aneurysm, without rupture, unspecified: Secondary | ICD-10-CM

## 2015-06-29 MED ORDER — IOPAMIDOL (ISOVUE-370) INJECTION 76%
75.0000 mL | Freq: Once | INTRAVENOUS | Status: AC | PRN
  Administered 2015-06-29: 75 mL via INTRAVENOUS

## 2015-06-30 DIAGNOSIS — R791 Abnormal coagulation profile: Secondary | ICD-10-CM | POA: Diagnosis not present

## 2015-07-03 DIAGNOSIS — R791 Abnormal coagulation profile: Secondary | ICD-10-CM | POA: Diagnosis not present

## 2015-07-04 ENCOUNTER — Encounter: Payer: Self-pay | Admitting: Surgery

## 2015-07-04 ENCOUNTER — Ambulatory Visit (INDEPENDENT_AMBULATORY_CARE_PROVIDER_SITE_OTHER): Payer: Medicare Other | Admitting: Surgery

## 2015-07-04 VITALS — BP 111/61 | HR 71 | Ht 69.0 in | Wt 156.0 lb

## 2015-07-04 DIAGNOSIS — I714 Abdominal aortic aneurysm, without rupture, unspecified: Secondary | ICD-10-CM
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
## 2015-07-05 DIAGNOSIS — Z86711 Personal history of pulmonary embolism: Secondary | ICD-10-CM | POA: Diagnosis not present

## 2015-07-05 DIAGNOSIS — L89894 Pressure ulcer of other site, stage 4: Secondary | ICD-10-CM | POA: Diagnosis not present

## 2015-07-05 DIAGNOSIS — I10 Essential (primary) hypertension: Secondary | ICD-10-CM | POA: Diagnosis not present

## 2015-07-14 DIAGNOSIS — L89154 Pressure ulcer of sacral region, stage 4: Secondary | ICD-10-CM | POA: Diagnosis not present

## 2015-08-19 DIAGNOSIS — L89894 Pressure ulcer of other site, stage 4: Secondary | ICD-10-CM | POA: Diagnosis not present

## 2015-08-19 DIAGNOSIS — I10 Essential (primary) hypertension: Secondary | ICD-10-CM | POA: Diagnosis not present

## 2015-08-19 DIAGNOSIS — Z86711 Personal history of pulmonary embolism: Secondary | ICD-10-CM | POA: Diagnosis not present

## 2015-09-21 ENCOUNTER — Ambulatory Visit (HOSPITAL_BASED_OUTPATIENT_CLINIC_OR_DEPARTMENT_OTHER): Payer: Medicare Other

## 2015-09-27 ENCOUNTER — Ambulatory Visit (HOSPITAL_BASED_OUTPATIENT_CLINIC_OR_DEPARTMENT_OTHER): Payer: Medicare Other

## 2015-10-04 DIAGNOSIS — I10 Essential (primary) hypertension: Secondary | ICD-10-CM | POA: Diagnosis not present

## 2015-10-04 DIAGNOSIS — L89894 Pressure ulcer of other site, stage 4: Secondary | ICD-10-CM | POA: Diagnosis not present

## 2015-10-04 DIAGNOSIS — Z86711 Personal history of pulmonary embolism: Secondary | ICD-10-CM | POA: Diagnosis not present

## 2015-11-15 ENCOUNTER — Ambulatory Visit (INDEPENDENT_AMBULATORY_CARE_PROVIDER_SITE_OTHER): Payer: Medicare Other | Admitting: Urology

## 2015-11-15 DIAGNOSIS — N50811 Right testicular pain: Secondary | ICD-10-CM | POA: Diagnosis not present

## 2016-04-17 IMAGING — CT CT HEAD W/O CM
1 of 2 series · 13 of 30 positions shown, 17 images · non-contrast
Comparison: August 25, 2014

CLINICAL DATA: Altered mental status

EXAM:
CT HEAD WITHOUT CONTRAST
TECHNIQUE: Contiguous axial images were obtained from the base of the skull
through the vertex without intravenous contrast.

[Series 2: headseq 4.8 h37s · axial · 0.39mm/px · z∈[+94,+239]mm · 13 of 36 slices shown, 17 images]
[im 3/36  brain]
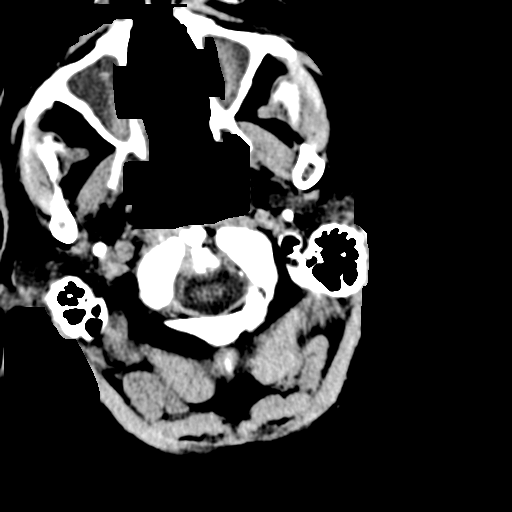
[im 3/36  bone]
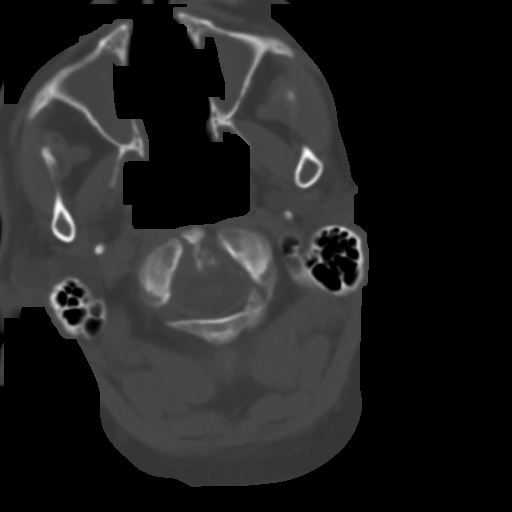
[im 6/36  brain]
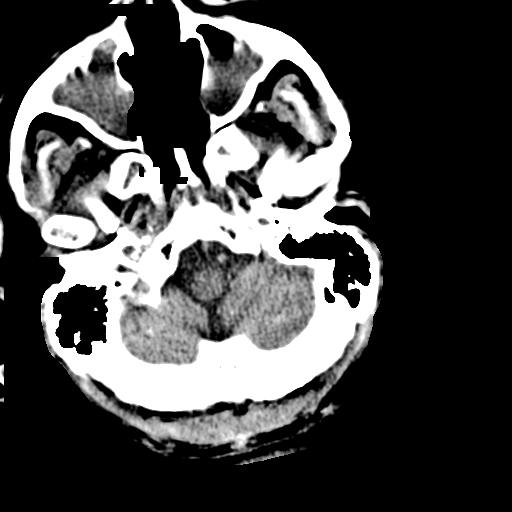
[im 8/36  brain]
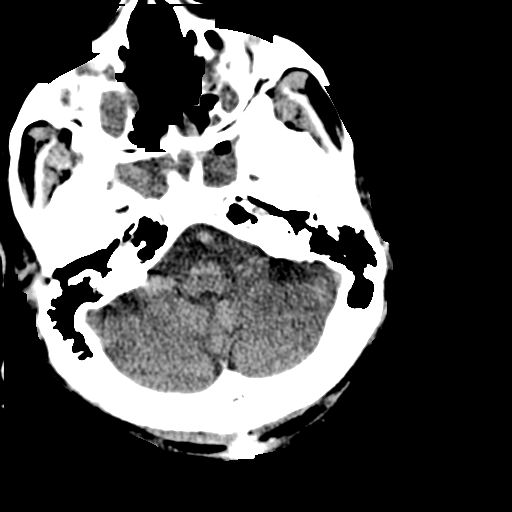
[im 11/36  brain]
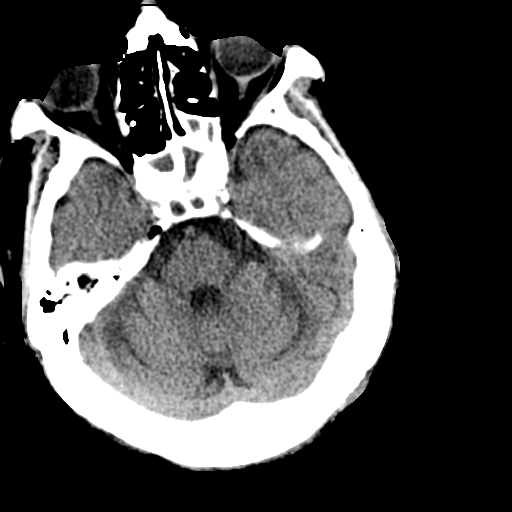
[im 13/36  brain]
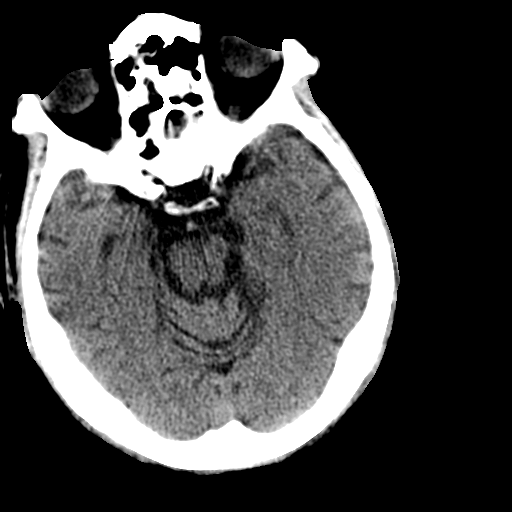
[im 13/36  bone]
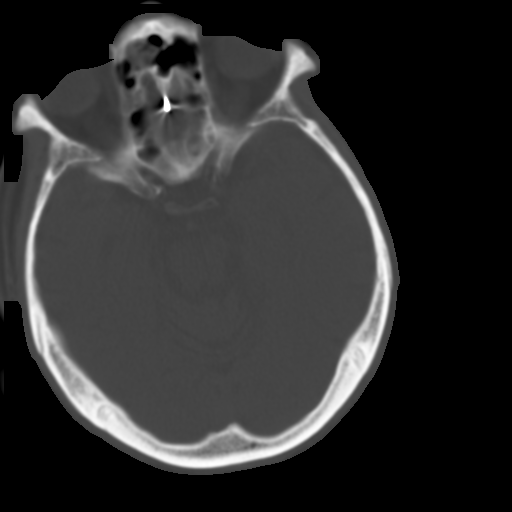
[im 16/36  brain]
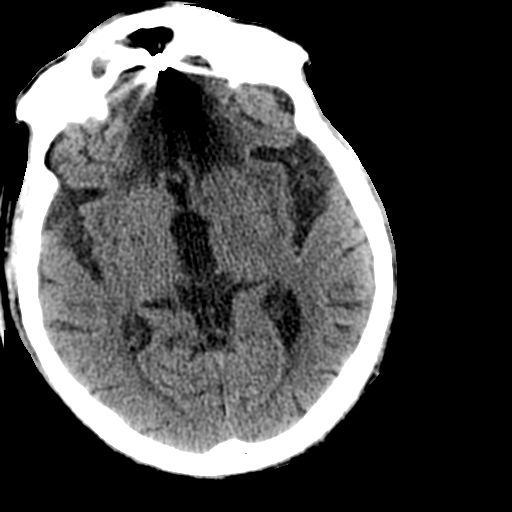
[im 18/36  brain]
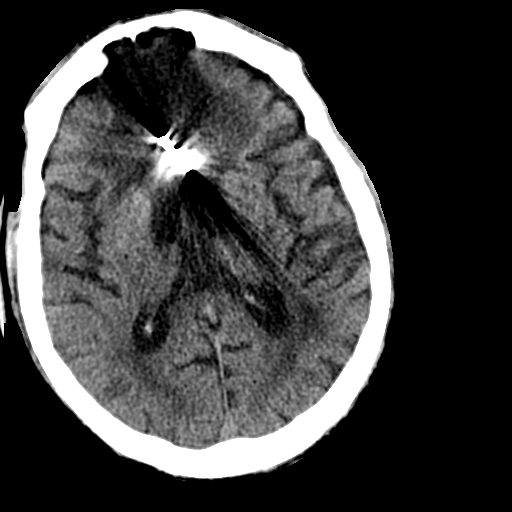
[im 21/36  brain]
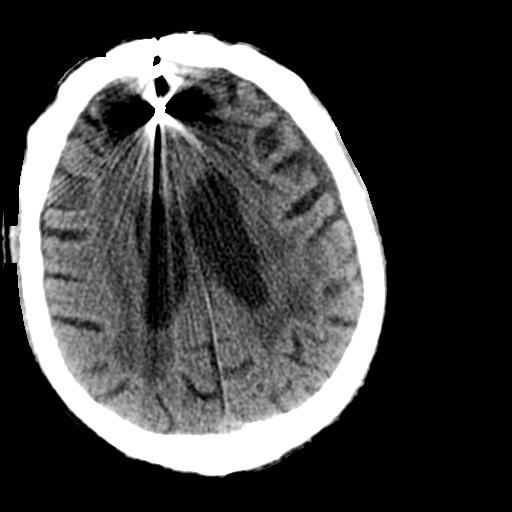
[im 23/36  brain]
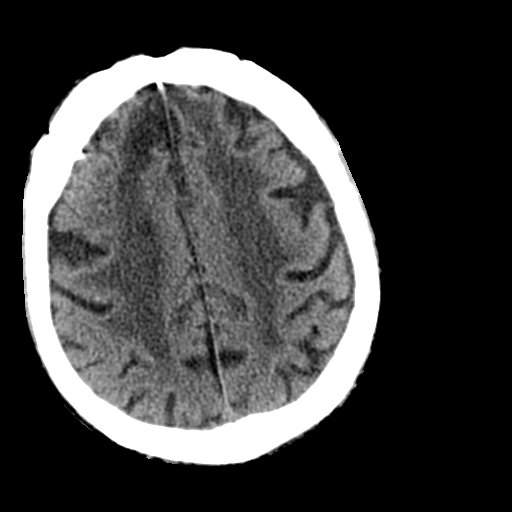
[im 23/36  bone]
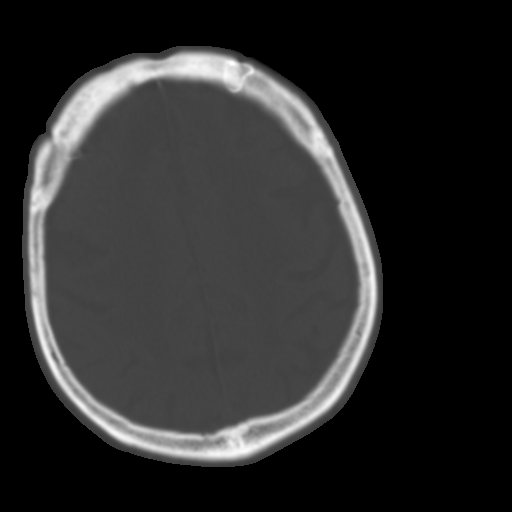
[im 26/36  brain]
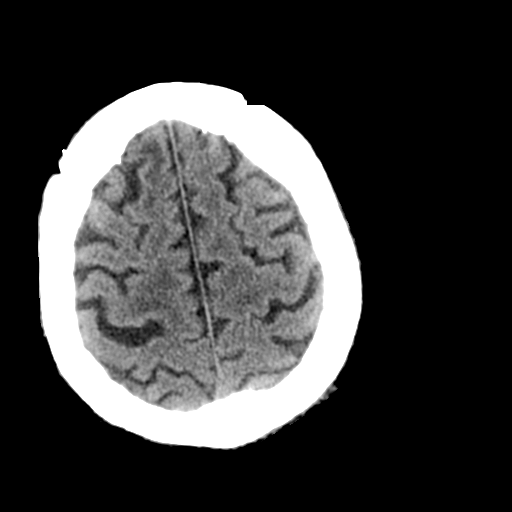
[im 28/36  brain]
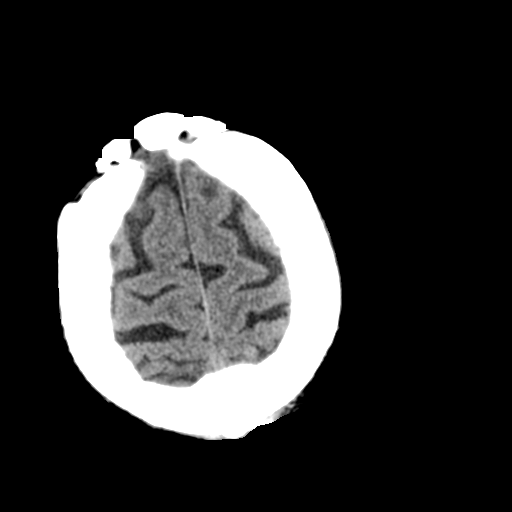
[im 31/36  brain]
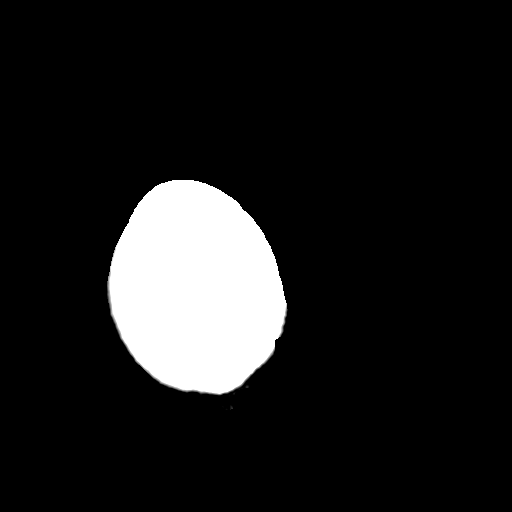
[im 33/36  brain]
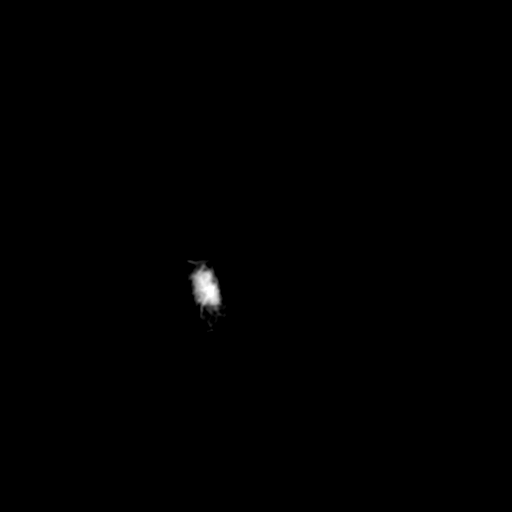
[im 33/36  bone]
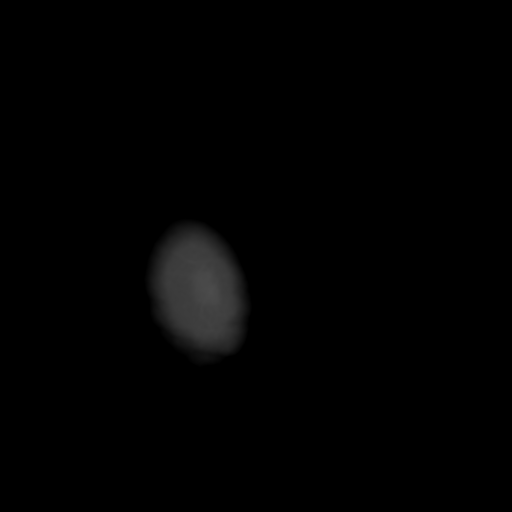

[13 of 30 positions shown; findings below may reference images not displayed]

FINDINGS: There is moderate diffuse atrophy which is stable. There is
encephalomalacia in both frontal lobes with aneurysm clips located
in the midline slightly anterior to the lateral ventricles
consistent with anterior cerebral artery aneurysm clipping. There is
no intracranial mass, hemorrhage, extra-axial fluid collection, or
midline shift. There is extensive small vessel disease in the centra
semiovale bilaterally. There is no demonstrable acute infarct.

The bony calvarium appears intact except for bilateral frontal
craniotomy defects. Mastoid air cells are clear. There is diffuse
opacification of each maxillary antrum as well as multiple posterior
ethmoid air cells and the sphenoid sinus complexes bilaterally.
There is also moderate right frontal sinus opacification.
IMPRESSION: Atrophy with small vessel disease, stable. Encephalomalacia in both
frontal lobes consistent with the previous aneurysm clipping
surgery. No acute infarct apparent. No hemorrhage, mass, or
extra-axial fluid collection. Postoperative change in each frontal
bone. Extensive paranasal sinus disease. Mastoids bilaterally clear.

## 2016-04-17 IMAGING — CR DG CHEST 1V PORT
1 series · 1 of 1 positions shown · non-contrast
Comparison: Single view of the chest and CT chest 10/08/2008.

CLINICAL DATA: Altered mental status.

EXAM:
PORTABLE CHEST - 1 VIEW

[portable]
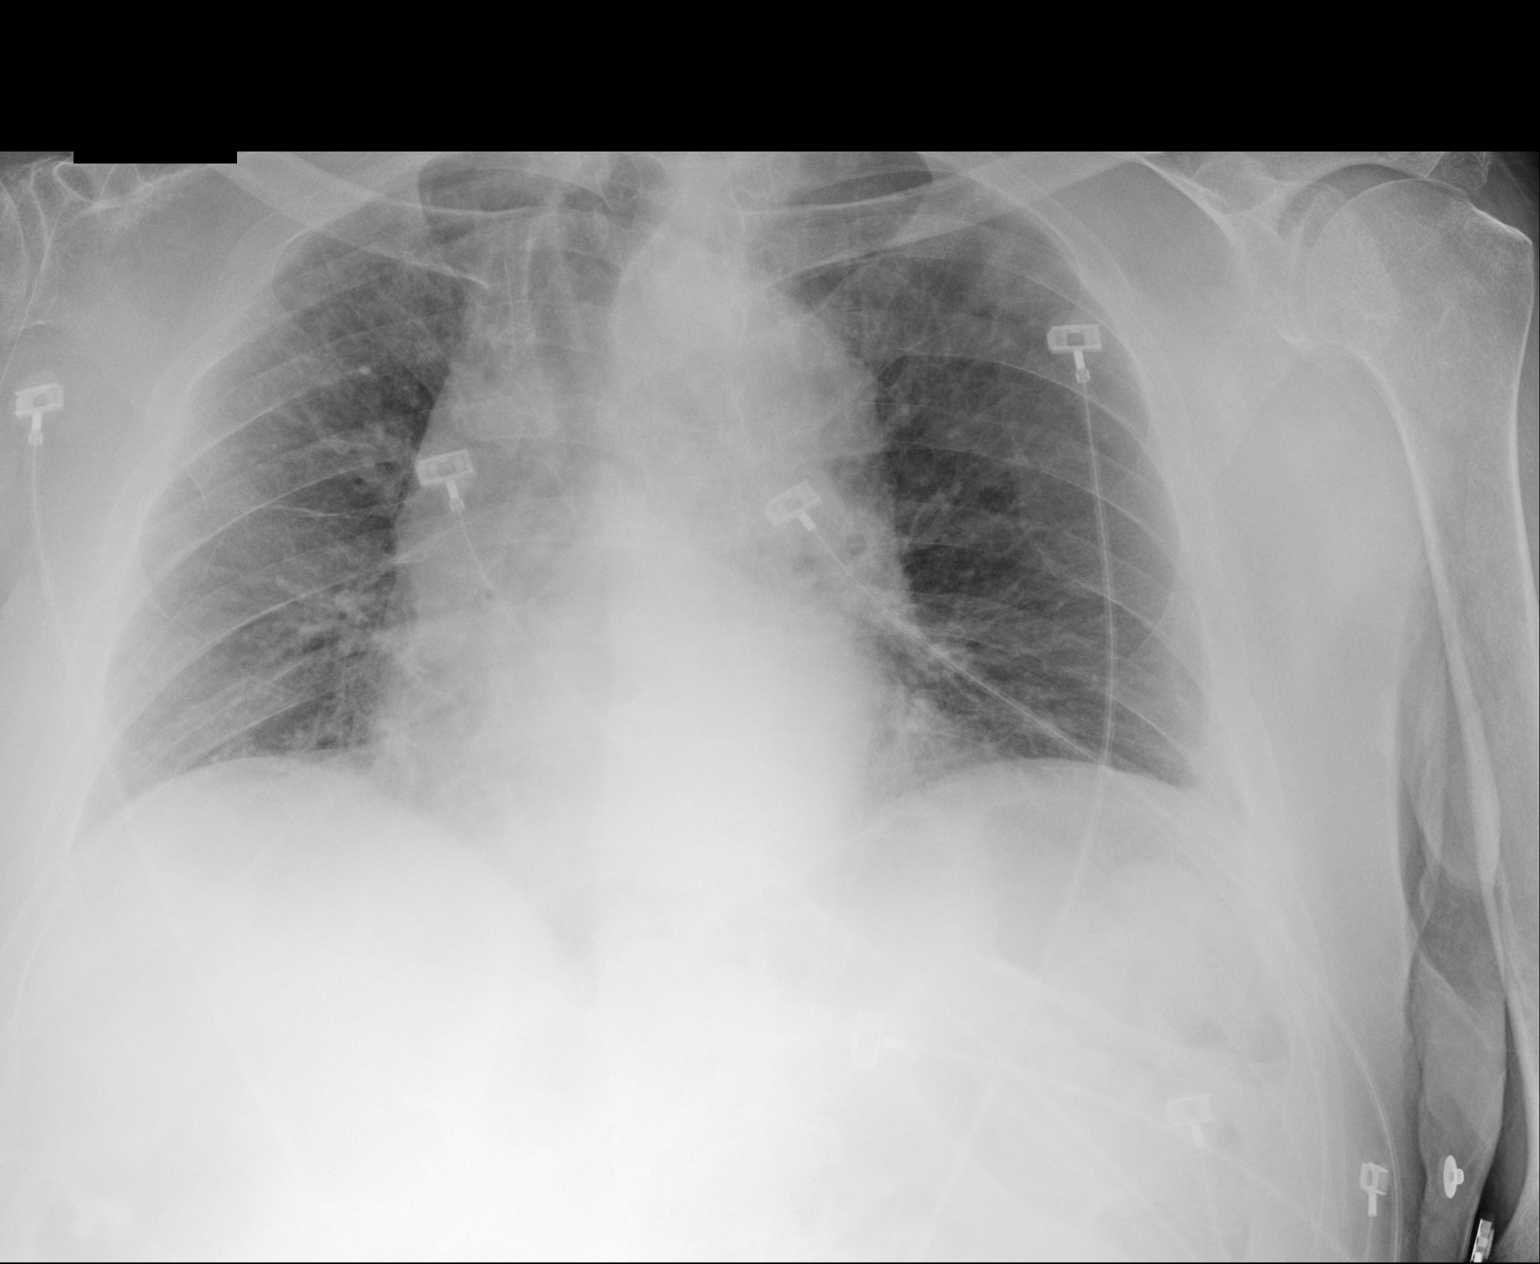

[1 of 1 positions shown; findings below may reference images not displayed]

FINDINGS: Lung volumes are low. No consolidative process, pneumothorax or
effusion is identified. Heart size is upper normal.
IMPRESSION: No acute disease.

## 2019-05-07 DEATH — deceased
